# Patient Record
Sex: Female | Born: 1955
Health system: Southern US, Community
[De-identification: ages and names within clinical notes are randomized; demographics above are authoritative.]

## PROBLEM LIST (undated history)

## (undated) DIAGNOSIS — M1990 Unspecified osteoarthritis, unspecified site: Secondary | ICD-10-CM

## (undated) DIAGNOSIS — Z8719 Personal history of other diseases of the digestive system: Secondary | ICD-10-CM

## (undated) DIAGNOSIS — I499 Cardiac arrhythmia, unspecified: Secondary | ICD-10-CM

## (undated) DIAGNOSIS — D649 Anemia, unspecified: Secondary | ICD-10-CM

## (undated) DIAGNOSIS — K219 Gastro-esophageal reflux disease without esophagitis: Secondary | ICD-10-CM

## (undated) DIAGNOSIS — E785 Hyperlipidemia, unspecified: Secondary | ICD-10-CM

## (undated) DIAGNOSIS — M75122 Complete rotator cuff tear or rupture of left shoulder, not specified as traumatic: Secondary | ICD-10-CM

## (undated) DIAGNOSIS — I38 Endocarditis, valve unspecified: Secondary | ICD-10-CM

## (undated) DIAGNOSIS — K449 Diaphragmatic hernia without obstruction or gangrene: Secondary | ICD-10-CM

## (undated) DIAGNOSIS — M7582 Other shoulder lesions, left shoulder: Secondary | ICD-10-CM

## (undated) DIAGNOSIS — R011 Cardiac murmur, unspecified: Secondary | ICD-10-CM

## (undated) DIAGNOSIS — N814 Uterovaginal prolapse, unspecified: Secondary | ICD-10-CM

## (undated) DIAGNOSIS — I10 Essential (primary) hypertension: Secondary | ICD-10-CM

## (undated) DIAGNOSIS — N393 Stress incontinence (female) (male): Secondary | ICD-10-CM

## (undated) DIAGNOSIS — K5790 Diverticulosis of intestine, part unspecified, without perforation or abscess without bleeding: Secondary | ICD-10-CM

## (undated) HISTORY — PX: CARDIAC CATHETERIZATION: SHX172

## (undated) HISTORY — PX: JOINT REPLACEMENT: SHX530

## (undated) HISTORY — PX: CHOLECYSTECTOMY: SHX55

## (undated) HISTORY — PX: HAMMER TOE SURGERY: SHX385

## (undated) HISTORY — PX: ABDOMINAL HYSTERECTOMY: SHX81

---

## 2002-06-02 HISTORY — PX: ROTATOR CUFF REPAIR: SHX139

## 2002-06-02 HISTORY — PX: HALLUX VALGUS CORRECTION: SUR315

## 2002-08-17 DIAGNOSIS — I10 Essential (primary) hypertension: Secondary | ICD-10-CM | POA: Insufficient documentation

## 2003-06-22 DIAGNOSIS — K449 Diaphragmatic hernia without obstruction or gangrene: Secondary | ICD-10-CM | POA: Insufficient documentation

## 2003-08-18 LAB — HM COLONOSCOPY

## 2003-08-28 DIAGNOSIS — K5730 Diverticulosis of large intestine without perforation or abscess without bleeding: Secondary | ICD-10-CM | POA: Insufficient documentation

## 2003-08-28 DIAGNOSIS — R011 Cardiac murmur, unspecified: Secondary | ICD-10-CM | POA: Insufficient documentation

## 2005-04-03 ENCOUNTER — Ambulatory Visit: Payer: Self-pay | Admitting: Family Medicine

## 2007-03-11 ENCOUNTER — Ambulatory Visit: Payer: Self-pay | Admitting: Family Medicine

## 2007-03-11 ENCOUNTER — Inpatient Hospital Stay: Payer: Self-pay | Admitting: General Surgery

## 2007-10-12 DIAGNOSIS — M2550 Pain in unspecified joint: Secondary | ICD-10-CM | POA: Insufficient documentation

## 2010-06-02 DIAGNOSIS — N814 Uterovaginal prolapse, unspecified: Secondary | ICD-10-CM

## 2010-06-02 DIAGNOSIS — N393 Stress incontinence (female) (male): Secondary | ICD-10-CM

## 2010-06-02 HISTORY — DX: Uterovaginal prolapse, unspecified: N81.4

## 2010-06-02 HISTORY — DX: Stress incontinence (female) (male): N39.3

## 2010-12-19 ENCOUNTER — Ambulatory Visit: Payer: Self-pay | Admitting: Family Medicine

## 2010-12-24 LAB — HM PAP SMEAR: HM PAP: NORMAL

## 2011-04-02 ENCOUNTER — Ambulatory Visit: Payer: Self-pay

## 2011-04-17 ENCOUNTER — Ambulatory Visit: Payer: Self-pay | Admitting: Family Medicine

## 2011-10-16 ENCOUNTER — Ambulatory Visit: Payer: Self-pay | Admitting: General Surgery

## 2012-09-30 ENCOUNTER — Ambulatory Visit: Payer: Self-pay

## 2015-03-13 LAB — HM MAMMOGRAPHY

## 2015-04-04 ENCOUNTER — Other Ambulatory Visit: Payer: Self-pay

## 2015-04-05 ENCOUNTER — Encounter
Admission: RE | Admit: 2015-04-05 | Discharge: 2015-04-05 | Disposition: A | Discharge status: Home / Self Care | Payer: BLUE CROSS/BLUE SHIELD | Source: Ambulatory Visit | Attending: Obstetrics & Gynecology | Admitting: Obstetrics & Gynecology

## 2015-04-05 ENCOUNTER — Ambulatory Visit
Admission: RE | Admit: 2015-04-05 | Discharge: 2015-04-05 | Disposition: A | Discharge status: Home / Self Care | Payer: BLUE CROSS/BLUE SHIELD | Source: Ambulatory Visit | Attending: Obstetrics & Gynecology | Admitting: Obstetrics & Gynecology

## 2015-04-05 DIAGNOSIS — I10 Essential (primary) hypertension: Secondary | ICD-10-CM

## 2015-04-05 DIAGNOSIS — K449 Diaphragmatic hernia without obstruction or gangrene: Secondary | ICD-10-CM | POA: Insufficient documentation

## 2015-04-05 HISTORY — DX: Essential (primary) hypertension: I10

## 2015-04-05 HISTORY — DX: Cardiac arrhythmia, unspecified: I49.9

## 2015-04-05 HISTORY — DX: Stress incontinence (female) (male): N39.3

## 2015-04-05 HISTORY — DX: Uterovaginal prolapse, unspecified: N81.4

## 2015-04-05 LAB — BASIC METABOLIC PANEL
ANION GAP: 7 (ref 5–15)
BUN: 16 mg/dL (ref 6–20)
CO2: 29 mmol/L (ref 22–32)
Calcium: 9.9 mg/dL (ref 8.9–10.3)
Chloride: 105 mmol/L (ref 101–111)
Creatinine, Ser: 0.82 mg/dL (ref 0.44–1.00)
GFR calc Af Amer: >60 mL/min (ref >60)
Glucose, Bld: 103 mg/dL — ABNORMAL HIGH (ref 65–99)
POTASSIUM: 3.8 mmol/L (ref 3.5–5.1)
SODIUM: 141 mmol/L (ref 135–145)

## 2015-04-05 LAB — PROTIME-INR
INR: 0.96
Prothrombin Time: 13 seconds (ref 11.4–15.0)

## 2015-04-05 LAB — ABO/RH: ABO/RH(D): A POS

## 2015-04-05 LAB — CBC
HCT: 37.2 % (ref 35.0–47.0)
Hemoglobin: 12.3 g/dL (ref 12.0–16.0)
MCH: 29.7 pg (ref 26.0–34.0)
MCHC: 33.1 g/dL (ref 32.0–36.0)
MCV: 89.7 fL (ref 80.0–100.0)
PLATELETS: 212 10*3/uL (ref 150–440)
RBC: 4.15 MIL/uL (ref 3.80–5.20)
RDW: 13.2 % (ref 11.5–14.5)
WBC: 6.3 10*3/uL (ref 3.6–11.0)

## 2015-04-05 LAB — TYPE AND SCREEN
ABO/RH(D): A POS
Antibody Screen: NEGATIVE

## 2015-04-05 LAB — APTT: aPTT: 33 seconds (ref 24–36)

## 2015-04-05 NOTE — OR Nursing (Signed)
AS INSTRUCTED BY DR Amie Critchley, REQUEST FOR CLEARANCE CALLED AND FAXED TO KAY AT DR [facility] OFFICE.  ALSO SENT TO DR Kenton Kingfisher, SPOKE WITH Rolland Bimler

## 2015-04-05 NOTE — Patient Instructions (Addendum)
  Your procedure is scheduled on: Tuesday Nov. 8, 2016. Report to Same Day Surgery. To find out your arrival time please call [phone] between 1PM - 3PM on Monday November 7,2016.  Remember: Instructions that are not followed completely may result in serious medical risk, up to and including death, or upon the discretion of your surgeon and anesthesiologist your surgery may need to be rescheduled.    _x___ 1. Do not eat food or drink liquids after midnight. No gum chewing or hard candies.     _x___ 2. No Alcohol for 24 hours before or after surgery.   ____ 3. Bring all medications with you on the day of surgery if instructed.    __x__ 4. Notify your doctor if there is any change in your medical condition     (cold, fever, infections).     Do not wear jewelry, make-up, hairpins, clips or nail polish.  Do not wear lotions, powders, or perfumes. You may wear deodorant.  Do not shave 48 hours prior to surgery. Men may shave face and neck.  Do not bring valuables to the hospital.    [facility] is not responsible for any belongings or valuables.               Contacts, dentures or bridgework may not be worn into surgery.  Leave your suitcase in the car. After surgery it may be brought to your room.  For patients admitted to the hospital, discharge time is determined by your treatment team.   Patients discharged the day of surgery will not be allowed to drive home.    Please read over the following fact sheets that you were given:   [facility] Preparing for Surgery  __x__ Take these medicines the morning of surgery with A SIP OF WATER:    1. MAGNESIUM OXIDE  2. Omeprazole Magnesium (PRILOSEC OTC PO)    ____ Fleet Enema (as directed)   __x__ Use CHG Soap as directed  ____ Use inhalers on the day of surgery  ____ Stop metformin 2 days prior to surgery    ____ Take 1/2 of usual insulin dose the night before surgery and none on the morning of  surgery.   ____ Stop  Coumadin/Plavix/aspirin on does not apply.  _x___ Stop Anti-inflammatories such as Ibuprofen now.  Can take Tylenol for pain.   _x__ Stop supplements such as fish oil until after surgery.    ____ Bring C-Pap to the hospital.

## 2015-04-06 NOTE — Pre-Procedure Instructions (Signed)
Medical clearance had been faxed to PCP Dr. Venia Minks regarding abnormal EKG by S. Fields RN on 04/05/15 at 1345.  This RN received a phone call from Dr. Kenton Kingfisher' nurse Izora Gala this am regarding clearance.  Informed Izora Gala that a medical clearance had been sent to Dr. Sharyon Medicus office yesterday.  This RN called Dr. Sharyon Medicus office, spoke to [facility] regarding Medical clearance status at 11:45 am.  Received a call from Dr. Venia Minks at 1249 regarding medical clearance.  She has not seen this pt since 2014 and will need to see pt in order to evaluate and clear pt for surgery.  Dr. Sharyon Medicus office will contact pt for appt and take it from there.  This RN called Izora Gala at Dr. Kenton Kingfisher' office and left on voice mail the above information and my phone number to call for concerns or questions.

## 2015-04-06 NOTE — Pre-Procedure Instructions (Signed)
Izora Gala from Dr. Kenton Kingfisher' office called, pt has an appt at noon on Monday Nov. 7, 2016 for medical clearance.  Pt may get cleared that day and proceed with surgery on Tuesday or she may need further testing and referral to cardiologist and the surgery may be post phoned until clearance is obtained.

## 2015-04-09 ENCOUNTER — Ambulatory Visit (INDEPENDENT_AMBULATORY_CARE_PROVIDER_SITE_OTHER): Payer: BLUE CROSS/BLUE SHIELD | Admitting: Family Medicine

## 2015-04-09 ENCOUNTER — Encounter: Payer: Self-pay | Admitting: Family Medicine

## 2015-04-09 VITALS — BP 158/74 | HR 68 | Temp 98.1°F | Resp 16 | Ht 66.0 in | Wt 184.0 lb

## 2015-04-09 DIAGNOSIS — I10 Essential (primary) hypertension: Secondary | ICD-10-CM

## 2015-04-09 DIAGNOSIS — Z01818 Encounter for other preprocedural examination: Secondary | ICD-10-CM

## 2015-04-09 DIAGNOSIS — S82899A Other fracture of unspecified lower leg, initial encounter for closed fracture: Secondary | ICD-10-CM | POA: Insufficient documentation

## 2015-04-09 DIAGNOSIS — R9431 Abnormal electrocardiogram [ECG] [EKG]: Secondary | ICD-10-CM | POA: Insufficient documentation

## 2015-04-09 DIAGNOSIS — N814 Uterovaginal prolapse, unspecified: Secondary | ICD-10-CM | POA: Insufficient documentation

## 2015-04-09 MED ORDER — LISINOPRIL-HYDROCHLOROTHIAZIDE 10-12.5 MG PO TABS: 1.0000 | ORAL_TABLET | Freq: Every day | ORAL | Status: DC

## 2015-04-09 NOTE — Progress Notes (Signed)
Subjective:     Patient ID: Kelly Nguyen, female   DOB: December 12, 1955, 59 y.o.   MRN: [id number]  HPI   Patient here for pre-op clearance. Is having no current problems. Feels healthier than she has in years. Has new house with stairs. No chest pain, no breathing problems. Has had not prior problems with anesthesia.   Had cardiac work up in 2005 for abnormal EKG.  Had normal cardiac cath at that time.  Has had surgeries since then with no problems. No current medical concerns except need hysterectomy for prolapse. Has stopped her blood pressure medication on recommendation of pre-op MD.    Review of Systems  Constitutional: Negative.   Respiratory: Negative.   Cardiovascular: Negative.   Gastrointestinal: Negative.   Musculoskeletal: Positive for arthralgias. Negative for myalgias, back pain, joint swelling, gait problem, neck pain and neck stiffness.  Neurological: Negative.   .  Patient Active Problem List   Diagnosis Date Noted  . Uterine prolapse 04/09/2015  . Closed fracture of ankle 04/09/2015  . Arthralgia of multiple joints 10/12/2007  . Colon, diverticulosis 08/28/2003  . Cardiac murmur 08/28/2003  . Diaphragmatic hernia 06/22/2003  . Essential (primary) hypertension 08/17/2002   Family History  Problem Relation Age of Onset  . Healthy Mother   . Mental illness Mother   . Heart disease Father   . CAD Father   . Diabetes Other    Social History   Social History  . Marital Status: Married    Spouse Name: N/A  . Number of Children: 1  . Years of Education: N/A   Occupational History  . Full-Time     Retiring in March 2017   Social History Main Topics  . Smoking status: Never Smoker   . Smokeless tobacco: Never Used  . Alcohol Use: 4.2 oz/week    7 Glasses of wine per week  . Drug Use: No  . Sexual Activity: Yes    Birth Control/ Protection: Post-menopausal   Other Topics Concern  . Not on file   Social History Narrative   Past Surgical History  Procedure  Laterality Date  . Hallux valgus correction Right 2004  . Cholecystectomy    . Rotator cuff repair Right 2004   No Known Allergies Previous Medications   B COMPLEX VITAMINS (VITAMIN B COMPLEX PO)    Take 1 tablet by mouth every morning.   ETODOLAC (LODINE) 300 MG CAPSULE    Take 300 mg by mouth every morning.   FISH OIL OIL    1 capsule by Does not apply route every morning.   FLUTICASONE (FLONASE) 50 MCG/ACT NASAL SPRAY    Place into both nostrils daily as needed for allergies or rhinitis.   MAGNESIUM OXIDE PO    Take 1 tablet by mouth every morning.   MULTIPLE VITAMINS-MINERALS (MULTIVITAMIN WITH MINERALS) TABLET    Take 1 tablet by mouth daily.   OMEPRAZOLE MAGNESIUM (PRILOSEC OTC PO)    Take 0.25 tablets by mouth every morning.   PROBIOTIC PRODUCT (PROBIOTIC DAILY PO)    Take 1 capsule by mouth every morning.   RESVERATROL PO    Take 1 capsule by mouth every morning.   BP 158/74 mmHg  Pulse 68  Temp(Src) 98.1 F (36.7 C) (Oral)  Resp 16  Ht 5\' 6"  (1.676 m)  Wt 184 lb (83.462 kg)  BMI 29.71 kg/m2      Objective:   Physical Exam  Constitutional: She is oriented to person, place, and time. She appears  well-developed and well-nourished.  Cardiovascular: Normal rate and regular rhythm.   Neurological: She is alert and oriented to person, place, and time.  Psychiatric: She has a normal mood and affect. Her behavior is normal. Judgment and thought content normal.   BP 158/74 mmHg  Pulse 68  Temp(Src) 98.1 F (36.7 C) (Oral)  Resp 16  Ht 5\' 6"  (1.676 m)  Wt 184 lb (83.462 kg)  BMI 29.71 kg/m2     Assessment:     Healthy 59 yo female.     Plan:     1. Preoperative clearance Patient at average risk for surgery. No risk factors identified.  Reviewed previous records. EKG unchanged. Previous cardiac work up normal.  No current symptoms. Labs normal.    2. Essential (primary) hypertension Sent new prescription. Clarify if she should take her BP medication. Is elevated  today.   - lisinopril-hydrochlorothiazide (PRINZIDE,ZESTORETIC) 10-12.5 MG tablet; Take 1 tablet by mouth daily.  Dispense: 90 tablet; Refill: 1  3. Abnormal EKG Unchanged from 2005.   Margarita Rana, MD

## 2015-04-10 ENCOUNTER — Observation Stay
Admission: RE | Admit: 2015-04-10 | Discharge: 2015-04-11 | Disposition: A | Discharge status: Home / Self Care | Payer: BLUE CROSS/BLUE SHIELD | Source: Ambulatory Visit | Attending: Internal Medicine | Admitting: Internal Medicine

## 2015-04-10 ENCOUNTER — Encounter: Payer: Self-pay | Admitting: *Deleted

## 2015-04-10 ENCOUNTER — Encounter
Admission: RE | Disposition: A | Discharge status: Home / Self Care | Payer: Self-pay | Source: Ambulatory Visit | Attending: Obstetrics & Gynecology

## 2015-04-10 ENCOUNTER — Observation Stay: Payer: BLUE CROSS/BLUE SHIELD

## 2015-04-10 ENCOUNTER — Ambulatory Visit: Payer: BLUE CROSS/BLUE SHIELD | Admitting: Anesthesiology

## 2015-04-10 DIAGNOSIS — Z8249 Family history of ischemic heart disease and other diseases of the circulatory system: Secondary | ICD-10-CM | POA: Insufficient documentation

## 2015-04-10 DIAGNOSIS — Z9889 Other specified postprocedural states: Secondary | ICD-10-CM | POA: Diagnosis not present

## 2015-04-10 DIAGNOSIS — D251 Intramural leiomyoma of uterus: Secondary | ICD-10-CM | POA: Diagnosis not present

## 2015-04-10 DIAGNOSIS — D649 Anemia, unspecified: Secondary | ICD-10-CM | POA: Diagnosis not present

## 2015-04-10 DIAGNOSIS — Z791 Long term (current) use of non-steroidal anti-inflammatories (NSAID): Secondary | ICD-10-CM | POA: Insufficient documentation

## 2015-04-10 DIAGNOSIS — K5790 Diverticulosis of intestine, part unspecified, without perforation or abscess without bleeding: Secondary | ICD-10-CM | POA: Diagnosis not present

## 2015-04-10 DIAGNOSIS — Z818 Family history of other mental and behavioral disorders: Secondary | ICD-10-CM | POA: Diagnosis not present

## 2015-04-10 DIAGNOSIS — Z833 Family history of diabetes mellitus: Secondary | ICD-10-CM | POA: Diagnosis not present

## 2015-04-10 DIAGNOSIS — I10 Essential (primary) hypertension: Secondary | ICD-10-CM | POA: Insufficient documentation

## 2015-04-10 DIAGNOSIS — N814 Uterovaginal prolapse, unspecified: Secondary | ICD-10-CM | POA: Diagnosis present

## 2015-04-10 DIAGNOSIS — J9811 Atelectasis: Secondary | ICD-10-CM | POA: Diagnosis not present

## 2015-04-10 DIAGNOSIS — I959 Hypotension, unspecified: Secondary | ICD-10-CM | POA: Insufficient documentation

## 2015-04-10 DIAGNOSIS — R42 Dizziness and giddiness: Secondary | ICD-10-CM | POA: Diagnosis not present

## 2015-04-10 DIAGNOSIS — R0602 Shortness of breath: Secondary | ICD-10-CM | POA: Diagnosis not present

## 2015-04-10 DIAGNOSIS — Z79899 Other long term (current) drug therapy: Secondary | ICD-10-CM | POA: Insufficient documentation

## 2015-04-10 DIAGNOSIS — Z7951 Long term (current) use of inhaled steroids: Secondary | ICD-10-CM | POA: Insufficient documentation

## 2015-04-10 DIAGNOSIS — K760 Fatty (change of) liver, not elsewhere classified: Secondary | ICD-10-CM | POA: Diagnosis not present

## 2015-04-10 DIAGNOSIS — K449 Diaphragmatic hernia without obstruction or gangrene: Secondary | ICD-10-CM | POA: Insufficient documentation

## 2015-04-10 DIAGNOSIS — N3946 Mixed incontinence: Secondary | ICD-10-CM | POA: Diagnosis present

## 2015-04-10 DIAGNOSIS — Z90710 Acquired absence of both cervix and uterus: Secondary | ICD-10-CM | POA: Diagnosis present

## 2015-04-10 DIAGNOSIS — N858 Other specified noninflammatory disorders of uterus: Secondary | ICD-10-CM | POA: Diagnosis not present

## 2015-04-10 DIAGNOSIS — R9431 Abnormal electrocardiogram [ECG] [EKG]: Secondary | ICD-10-CM | POA: Insufficient documentation

## 2015-04-10 HISTORY — PX: SALPINGOOPHORECTOMY: SHX82

## 2015-04-10 HISTORY — PX: VAGINAL HYSTERECTOMY: SHX2639

## 2015-04-10 HISTORY — PX: CYSTOCELE REPAIR: SHX163

## 2015-04-10 LAB — CBC
HCT: 30.3 % — ABNORMAL LOW (ref 35.0–47.0)
Hemoglobin: 9.8 g/dL — ABNORMAL LOW (ref 12.0–16.0)
MCH: 29.3 pg (ref 26.0–34.0)
MCHC: 32.2 g/dL (ref 32.0–36.0)
MCV: 90.9 fL (ref 80.0–100.0)
PLATELETS: 199 10*3/uL (ref 150–440)
RBC: 3.33 MIL/uL — ABNORMAL LOW (ref 3.80–5.20)
RDW: 13.3 % (ref 11.5–14.5)
WBC: 11.1 10*3/uL — AB (ref 3.6–11.0)

## 2015-04-10 LAB — COMPREHENSIVE METABOLIC PANEL
ALBUMIN: 3 g/dL — AB (ref 3.5–5.0)
ALT: 35 U/L (ref 14–54)
AST: 29 U/L (ref 15–41)
Alkaline Phosphatase: 60 U/L (ref 38–126)
Anion gap: 7 (ref 5–15)
BUN: 16 mg/dL (ref 6–20)
CHLORIDE: 105 mmol/L (ref 101–111)
CO2: 28 mmol/L (ref 22–32)
Calcium: 8.7 mg/dL — ABNORMAL LOW (ref 8.9–10.3)
Creatinine, Ser: 0.84 mg/dL (ref 0.44–1.00)
GFR calc Af Amer: >60 mL/min (ref >60)
GFR calc non Af Amer: >60 mL/min (ref >60)
GLUCOSE: 153 mg/dL — AB (ref 65–99)
POTASSIUM: 3.7 mmol/L (ref 3.5–5.1)
Sodium: 140 mmol/L (ref 135–145)
Total Bilirubin: 0.2 mg/dL — ABNORMAL LOW (ref 0.3–1.2)
Total Protein: 5.4 g/dL — ABNORMAL LOW (ref 6.5–8.1)

## 2015-04-10 LAB — TROPONIN I: Troponin I: <0.03 ng/mL (ref <0.031)

## 2015-04-10 LAB — MAGNESIUM: Magnesium: 1.8 mg/dL (ref 1.7–2.4)

## 2015-04-10 SURGERY — COLPORRHAPHY, ANTERIOR, FOR CYSTOCELE REPAIR
Anesthesia: General

## 2015-04-10 MED ORDER — ACETAMINOPHEN 10 MG/ML IV SOLN
INTRAVENOUS | Status: AC
  Filled 2015-04-10: qty 100

## 2015-04-10 MED ORDER — LIDOCAINE HCL (CARDIAC) 20 MG/ML IV SOLN
INTRAVENOUS | Status: DC | PRN
  Administered 2015-04-10: 100 mg via INTRAVENOUS

## 2015-04-10 MED ORDER — ONDANSETRON HCL 4 MG/2ML IJ SOLN: 4.0000 mg | Freq: Once | INTRAMUSCULAR | Status: DC | PRN

## 2015-04-10 MED ORDER — ACETAMINOPHEN 325 MG PO TABS: 650.0000 mg | ORAL_TABLET | ORAL | Status: DC | PRN

## 2015-04-10 MED ORDER — LACTATED RINGERS IV SOLN
INTRAVENOUS | Status: DC
  Administered 2015-04-10 – 2015-04-11 (×3): via INTRAVENOUS

## 2015-04-10 MED ORDER — SENNOSIDES-DOCUSATE SODIUM 8.6-50 MG PO TABS: 1.0000 | ORAL_TABLET | Freq: Every evening | ORAL | Status: DC | PRN

## 2015-04-10 MED ORDER — BISACODYL 10 MG RE SUPP: 10.0000 mg | Freq: Every day | RECTAL | Status: DC | PRN

## 2015-04-10 MED ORDER — DEXAMETHASONE SODIUM PHOSPHATE 4 MG/ML IJ SOLN
INTRAMUSCULAR | Status: DC | PRN
  Administered 2015-04-10: 5 mg via INTRAVENOUS

## 2015-04-10 MED ORDER — DOCUSATE SODIUM 100 MG PO CAPS
100.0000 mg | ORAL_CAPSULE | Freq: Two times a day (BID) | ORAL | Status: DC
  Administered 2015-04-10 – 2015-04-11 (×4): 100 mg via ORAL
  Filled 2015-04-10 (×4): qty 1

## 2015-04-10 MED ORDER — FENTANYL CITRATE (PF) 100 MCG/2ML IJ SOLN
INTRAMUSCULAR | Status: AC
  Filled 2015-04-10: qty 2

## 2015-04-10 MED ORDER — ACETAMINOPHEN 10 MG/ML IV SOLN
INTRAVENOUS | Status: DC | PRN
  Administered 2015-04-10: 1000 mg via INTRAVENOUS

## 2015-04-10 MED ORDER — FENTANYL CITRATE (PF) 100 MCG/2ML IJ SOLN
25.0000 ug | INTRAMUSCULAR | Status: AC | PRN
  Administered 2015-04-10 (×6): 25 ug via INTRAVENOUS

## 2015-04-10 MED ORDER — EPHEDRINE SULFATE 50 MG/ML IJ SOLN
INTRAMUSCULAR | Status: DC | PRN
  Administered 2015-04-10: 5 mg via INTRAVENOUS
  Administered 2015-04-10 (×2): 10 mg via INTRAVENOUS

## 2015-04-10 MED ORDER — GLYCOPYRROLATE 0.2 MG/ML IJ SOLN
INTRAMUSCULAR | Status: DC | PRN
  Administered 2015-04-10: 0.2 mg via INTRAVENOUS
  Administered 2015-04-10: 0.4 mg via INTRAVENOUS

## 2015-04-10 MED ORDER — HYDROCHLOROTHIAZIDE 12.5 MG PO CAPS: 12.5000 mg | ORAL_CAPSULE | Freq: Every day | ORAL | Status: DC

## 2015-04-10 MED ORDER — BUPIVACAINE HCL (PF) 0.5 % IJ SOLN
INTRAMUSCULAR | Status: DC | PRN
  Administered 2015-04-10: 12 mL

## 2015-04-10 MED ORDER — FLUTICASONE PROPIONATE 50 MCG/ACT NA SUSP: 1.0000 | Freq: Every day | NASAL | Status: DC | PRN

## 2015-04-10 MED ORDER — MIDAZOLAM HCL 2 MG/2ML IJ SOLN
INTRAMUSCULAR | Status: DC | PRN
  Administered 2015-04-10: 2 mg via INTRAVENOUS

## 2015-04-10 MED ORDER — LISINOPRIL-HYDROCHLOROTHIAZIDE 10-12.5 MG PO TABS: 1.0000 | ORAL_TABLET | Freq: Every day | ORAL | Status: DC

## 2015-04-10 MED ORDER — CEFOXITIN SODIUM-DEXTROSE 2-2.2 GM-% IV SOLR (PREMIX)
2.0000 g | Freq: Once | INTRAVENOUS | Status: AC
  Administered 2015-04-10: 2 g via INTRAVENOUS

## 2015-04-10 MED ORDER — LIDOCAINE-EPINEPHRINE 1 %-1:100000 IJ SOLN
INTRAMUSCULAR | Status: AC
  Filled 2015-04-10: qty 1

## 2015-04-10 MED ORDER — ESTROGENS, CONJUGATED 0.625 MG/GM VA CREA
TOPICAL_CREAM | VAGINAL | Status: AC
  Filled 2015-04-10: qty 30

## 2015-04-10 MED ORDER — ONDANSETRON HCL 4 MG/2ML IJ SOLN
INTRAMUSCULAR | Status: DC | PRN
  Administered 2015-04-10: 4 mg via INTRAVENOUS

## 2015-04-10 MED ORDER — CEFOXITIN SODIUM-DEXTROSE 2-2.2 GM-% IV SOLR (PREMIX)
INTRAVENOUS | Status: AC
  Filled 2015-04-10: qty 50

## 2015-04-10 MED ORDER — PHENYLEPHRINE HCL 10 MG/ML IJ SOLN
INTRAMUSCULAR | Status: DC | PRN
  Administered 2015-04-10: 100 ug via INTRAVENOUS

## 2015-04-10 MED ORDER — FENTANYL CITRATE (PF) 100 MCG/2ML IJ SOLN
INTRAMUSCULAR | Status: DC | PRN
  Administered 2015-04-10: 50 ug via INTRAVENOUS
  Administered 2015-04-10: 100 ug via INTRAVENOUS
  Administered 2015-04-10: 50 ug via INTRAVENOUS

## 2015-04-10 MED ORDER — ROCURONIUM BROMIDE 100 MG/10ML IV SOLN
INTRAVENOUS | Status: DC | PRN
  Administered 2015-04-10: 40 mg via INTRAVENOUS
  Administered 2015-04-10: 10 mg via INTRAVENOUS

## 2015-04-10 MED ORDER — NEOSTIGMINE METHYLSULFATE 10 MG/10ML IV SOLN
INTRAVENOUS | Status: DC | PRN
Start: 2015-04-10 — End: 2015-04-10
  Administered 2015-04-10: 3 mg via INTRAVENOUS

## 2015-04-10 MED ORDER — LACTATED RINGERS IV BOLUS (SEPSIS)
500.0000 mL | Freq: Once | INTRAVENOUS | Status: AC
  Administered 2015-04-10: 500 mL via INTRAVENOUS

## 2015-04-10 MED ORDER — KETOROLAC TROMETHAMINE 30 MG/ML IJ SOLN: 30.0000 mg | Freq: Four times a day (QID) | INTRAMUSCULAR | Status: DC

## 2015-04-10 MED ORDER — ONDANSETRON HCL 4 MG/2ML IJ SOLN
4.0000 mg | Freq: Four times a day (QID) | INTRAMUSCULAR | Status: DC | PRN
  Administered 2015-04-10: 4 mg via INTRAVENOUS
  Filled 2015-04-10: qty 2

## 2015-04-10 MED ORDER — PANTOPRAZOLE SODIUM 40 MG PO TBEC
40.0000 mg | DELAYED_RELEASE_TABLET | Freq: Every day | ORAL | Status: DC
  Administered 2015-04-10 – 2015-04-11 (×2): 40 mg via ORAL
  Filled 2015-04-10 (×2): qty 1

## 2015-04-10 MED ORDER — LISINOPRIL 20 MG PO TABS: 10.0000 mg | ORAL_TABLET | Freq: Every day | ORAL | Status: DC

## 2015-04-10 MED ORDER — MORPHINE SULFATE (PF) 2 MG/ML IV SOLN
1.0000 mg | INTRAVENOUS | Status: DC | PRN
  Filled 2015-04-10: qty 1

## 2015-04-10 MED ORDER — OXYCODONE-ACETAMINOPHEN 5-325 MG PO TABS
1.0000 | ORAL_TABLET | ORAL | Status: DC | PRN
  Administered 2015-04-10: 1 via ORAL
  Administered 2015-04-10: 2 via ORAL
  Administered 2015-04-10: 1 via ORAL
  Administered 2015-04-11 (×4): 2 via ORAL
  Filled 2015-04-10 (×4): qty 2
  Filled 2015-04-10: qty 1
  Filled 2015-04-10 (×2): qty 2

## 2015-04-10 MED ORDER — PROPOFOL 10 MG/ML IV BOLUS
INTRAVENOUS | Status: DC | PRN
  Administered 2015-04-10: 170 mg via INTRAVENOUS
  Administered 2015-04-10: 30 mg via INTRAVENOUS

## 2015-04-10 MED ORDER — LACTATED RINGERS IV SOLN
INTRAVENOUS | Status: DC
  Administered 2015-04-10 (×2): via INTRAVENOUS

## 2015-04-10 MED ORDER — HYDROMORPHONE HCL 1 MG/ML IJ SOLN: 0.2500 mg | INTRAMUSCULAR | Status: DC | PRN

## 2015-04-10 MED ORDER — SIMETHICONE 80 MG PO CHEW
80.0000 mg | CHEWABLE_TABLET | Freq: Four times a day (QID) | ORAL | Status: DC | PRN
  Administered 2015-04-10: 80 mg via ORAL
  Filled 2015-04-10: qty 1

## 2015-04-10 MED ORDER — ONDANSETRON HCL 4 MG PO TABS: 4.0000 mg | ORAL_TABLET | Freq: Four times a day (QID) | ORAL | Status: DC | PRN

## 2015-04-10 SURGICAL SUPPLY — 49 items
ANCHOR SUT PEEK 3.0 ST 3 (SUTURE) ×4 IMPLANT
BAG URO DRAIN 2000ML W/SPOUT (MISCELLANEOUS) ×4 IMPLANT
BLADE SURG SZ11 CARB STEEL (BLADE) ×4 IMPLANT
CANISTER SUCT 1200ML W/VALVE (MISCELLANEOUS) ×4 IMPLANT
CATH FOLEY 2WAY  5CC 16FR (CATHETERS) ×1
CATH URTH 16FR FL 2W BLN LF (CATHETERS) ×3 IMPLANT
CHLORAPREP W/TINT 26ML (MISCELLANEOUS) ×4 IMPLANT
DEFOGGER SCOPE WARMER CLEARIFY (MISCELLANEOUS) IMPLANT
DEVICE SUTURE ENDOST 10MM (ENDOMECHANICALS) IMPLANT
DRAPE PERI LITHO V/GYN (MISCELLANEOUS) ×4 IMPLANT
DRSG TEGADERM 2-3/8X2-3/4 SM (GAUZE/BANDAGES/DRESSINGS) IMPLANT
ENDOSTITCH 0 SINGLE 48 (SUTURE) IMPLANT
GAUZE SPONGE NON-WVN 2X2 STRL (MISCELLANEOUS) IMPLANT
GLOVE BIO SURGEON STRL SZ8 (GLOVE) ×20 IMPLANT
GLOVE INDICATOR 8.0 STRL GRN (GLOVE) ×4 IMPLANT
GOWN STRL REUS W/ TWL LRG LVL3 (GOWN DISPOSABLE) ×3 IMPLANT
GOWN STRL REUS W/ TWL XL LVL3 (GOWN DISPOSABLE) ×3 IMPLANT
GOWN STRL REUS W/TWL LRG LVL3 (GOWN DISPOSABLE) ×1
GOWN STRL REUS W/TWL XL LVL3 (GOWN DISPOSABLE) ×1
IRRIGATION STRYKERFLOW (MISCELLANEOUS) IMPLANT
IRRIGATOR STRYKERFLOW (MISCELLANEOUS)
IV LACTATED RINGERS 1000ML (IV SOLUTION) ×4 IMPLANT
KIT RM TURNOVER CYSTO AR (KITS) ×4 IMPLANT
LABEL OR SOLS (LABEL) ×4 IMPLANT
LIQUID BAND (GAUZE/BANDAGES/DRESSINGS) IMPLANT
MANIPULATOR VCARE LG CRV RETR (MISCELLANEOUS) IMPLANT
MANIPULATOR VCARE STD CRV RETR (MISCELLANEOUS) IMPLANT
NEEDLE VERESS 14GA 120MM (NEEDLE) IMPLANT
NS IRRIG 500ML POUR BTL (IV SOLUTION) ×4 IMPLANT
OCCLUDER COLPOPNEUMO (BALLOONS) IMPLANT
PACK BASIN MINOR ARMC (MISCELLANEOUS) ×4 IMPLANT
PACK GYN LAPAROSCOPIC (MISCELLANEOUS) ×4 IMPLANT
PAD OB MATERNITY 4.3X12.25 (PERSONAL CARE ITEMS) ×4 IMPLANT
PAD PREP 24X41 OB/GYN DISP (PERSONAL CARE ITEMS) ×4 IMPLANT
SCISSORS METZENBAUM CVD 33 (INSTRUMENTS) IMPLANT
SET CYSTO W/LG BORE CLAMP LF (SET/KITS/TRAYS/PACK) IMPLANT
SHEARS HARMONIC ACE PLUS 36CM (ENDOMECHANICALS) IMPLANT
SLEEVE ENDOPATH XCEL 5M (ENDOMECHANICALS) IMPLANT
SPONGE LAP 18X18 5 PK (GAUZE/BANDAGES/DRESSINGS) ×4 IMPLANT
SPONGE VERSALON 2X2 STRL (MISCELLANEOUS)
SUT VIC AB 0 CT1 27 (SUTURE) ×4
SUT VIC AB 0 CT1 27XCR 8 STRN (SUTURE) ×12 IMPLANT
SUT VIC AB 0 CT1 36 (SUTURE) ×8 IMPLANT
SUT VIC AB 2-0 UR6 27 (SUTURE) IMPLANT
SYR 50ML LL SCALE MARK (SYRINGE) ×4 IMPLANT
SYRINGE 10CC LL (SYRINGE) ×4 IMPLANT
TROCAR ENDO BLADELESS 11MM (ENDOMECHANICALS) IMPLANT
TROCAR XCEL NON-BLD 5MMX100MML (ENDOMECHANICALS) IMPLANT
TUBING INSUFFLATOR HEATED (MISCELLANEOUS) IMPLANT

## 2015-04-10 NOTE — Anesthesia Procedure Notes (Signed)
Procedure Name: Intubation Date/Time: 04/10/2015 7:43 AM Performed by: Doreen Salvage Pre-anesthesia Checklist: Patient identified, Patient being monitored, Timeout performed, Emergency Drugs available and Suction available Patient Re-evaluated:Patient Re-evaluated prior to inductionOxygen Delivery Method: Circle system utilized Preoxygenation: Pre-oxygenation with 100% oxygen Intubation Type: IV induction Ventilation: Mask ventilation without difficulty Laryngoscope Size: Mac and 3 Grade View: Grade I Tube type: Oral Tube size: 7.0 mm Number of attempts: 1 Airway Equipment and Method: Stylet Placement Confirmation: ETT inserted through vocal cords under direct vision,  positive ETCO2 and breath sounds checked- equal and bilateral Secured at: 21 cm Tube secured with: Tape Dental Injury: Teeth and Oropharynx as per pre-operative assessment

## 2015-04-10 NOTE — Progress Notes (Signed)
GYN Update Note  Patient states she feels much better. Still having some dizziness and lightheadedness but feels much improved as well as with the SOB. Having some right shoulder pain but no chest pain  Patient Vitals for the past 12 hrs:  BP Temp Pulse Resp SpO2 Height Weight  04/10/15 1525 (!) 82/55 mmHg - 77 18 100 % - -  04/10/15 1522 - - - - (!) 86 % - -  04/10/15 1357 - - - - 96 % - -  04/10/15 1250 (!) 87/51 mmHg - 87 20 98 % - -  04/10/15 1220 (!) 79/46 mmHg - 61 - 97 % - -  04/10/15 1207 (!) 71/43 mmHg - (!) 58 20 95 % - -  04/10/15 1203 (!) 73/44 mmHg - (!) 55 - 95 % - -  04/10/15 1147 (!) 67/37 mmHg - - - (!) 84 % - -  04/10/15 1145 (!) 64/39 mmHg - (!) 57 - 94 % - -  04/10/15 1059 (!) 108/58 mmHg 98.1 F (36.7 C) (!) 56 18 98 % - -  04/10/15 1030 - 97.3 F (36.3 C) - - - - -  04/10/15 1010 - - - - 96 % - -  04/10/15 0930 104/64 mmHg 97 F (36.1 C) - 16 100 % - -  04/10/15 0615 (!) 147/74 mmHg 98.1 F (36.7 C) 68 16 100 % 5\' 6"  (1.676 m) 184 lb (83.462 kg)     NAD, pt's color looks better. Nasal cannula in place RRR ?2/6 SEM CTAB GU: clear UOP from foley, no gross VB   Recent Labs Lab 04/10/15 1258  NA 140  K 3.7  CL 105  CO2 28  BUN 16  CREATININE 0.84  CALCIUM 8.7*  PROT 5.4*  BILITOT 0.2*  ALKPHOS 60  ALT 35  AST 29  GLUCOSE 153*    Recent Labs Lab 04/05/15 1249 04/10/15 1258  WBC 6.3 11.1*  HGB 12.3 9.8*  HCT 37.2 30.3*  PLT 212 199    Recent Labs Lab 04/10/15 1258  MG 1.8    Recent Labs Lab 04/10/15 1258  TROPONINI <0.03   PCXR: normal (known hiatal hernia) ECG: appears to be unchanged from pre op  Pt stable to improved I told her hopefully just routine post op sedation and/or lethargy and should be feeling better as time goes on. Will continue troponin trend and continue to keep close eye on VS. Right shoulder pain likely referred from diaphragm (air intraperitoneal from surgery). Awaiting hospitalist consult placed by Dr.  Kenton Kingfisher.  Durene Romans MD [facility]  Pager: [phone]

## 2015-04-10 NOTE — Transfer of Care (Signed)
Immediate Anesthesia Transfer of Care Note  Patient: Kelly Nguyen  Procedure(s) Performed: Procedure(s) with comments: ANTERIOR REPAIR (CYSTOCELE) (N/A) HYSTERECTOMY VAGINAL,  SALPINGO OOPHORECTOMY (Bilateral) - vaginal  Patient Location: PACU  Anesthesia Type:General  Level of Consciousness: sedated  Airway & Oxygen Therapy: Patient Spontanous Breathing and Patient connected to face mask oxygen  Post-op Assessment: Report given to RN and Post -op Vital signs reviewed and stable  Post vital signs: Reviewed and stable  Last Vitals:  Filed Vitals:   04/10/15 0930  BP: 104/64  Pulse:   Temp: 36.1 C  Resp: 16    Complications: No apparent anesthesia complications

## 2015-04-10 NOTE — Op Note (Signed)
Preoperative diagnosis: Pelvic organ prolapse, complete; mixed urinary incontinence  Postoperative diagnosis: Same  Procedure: Transvaginal hysterectomy with Bilateral SalpingoOophorectomy and Anterior Colporrhaphy  Surgeon: Barnett Applebaum, M.D.  Assistant: Sheryn Bison, MD  Anesthesia: general  Findings: Small fibroid on uterus, ovaries small and normal, cystocele.  Estimated blood loss: 50 cc  Specimen: Uterus to pathology   Disposition: Tolerated procedure well  Procedure: Patient was taken to the OR where she was placed in dorsal lithotomy in [place]. She was prepped and draped in the usual sterile fashion. A timeout was performed. Foley is placed into bladder. A speculum was placed inside the vagina. The cervix was visualized and grasped with a tenaculum. 1% lidocaine with epinephrine were injected paracervically. A bovie was used to make a circumferential incision around the vagina. An opened sponge was used to dissect the vagina off the cervix. The posterior peritoneum was entered sharply with Mayo scissors.  The anterior peritoneal cavity was entered sharply with careful dissection of the bladder off the underlying cervix. A Heaney clamp was used to clamp first the left uterosacral ligament and cardinal which was then cut and Haney suture ligated with 0 Vicryl stitch, the stitch was held and later attached to the vaginal mucosa. Similarly the right uterosacral ligament was clamped cut and suture ligated.  Sequential clamping, transecting and suture ligating up the broad to the uterine arteries were taken until the tubo-ovarian pedicles were encountered. The uterus was then amputated. The left utero-ovarian pedicle grasped with a Heaney clamp. The right utero-ovarian pedicle was similarly grasped with the Heaney clamp. The right left ovaries were easily visualized. The left ovary and tube were grasped with Babcock clamp and a Heaney clamp placed behind this. Tube and ovary were removed  and the IP ligated with a free tie followed by suture ligature. The right tube and ovary were similarly grasped with a Babcock clamp and a Heaney clamp used to clamp behind this. The tube and ovary were removed on the side and the IP was ligated with a free tie followed by suture ligature. Inspection of all pedicles revealed adequate hemostasis.  The peritoneum was then closed with a running pursestring suture of 0 Vicryl. The uterosacrals were plicated using a 1-0 Ethibond suture.  Vagina is irrigated. Anterior colporrhaphy is performed.  Allis clamps are placed along the anterior vaginal wall, lidocaine is used to infiltrate the plane, and incision is made midline vertical.  Endopelvic fascia is dissected free of vaginal mucosa.  Fascia is plicated w interrupted vicryl sutures.  Excess mucosa is excised.  Vaginal incision is closed with a running locking Vicryl suture, to incoprate the hysterectomy incision as well, with care taken to incorporate the uterosacral pedicles. Excellent hemostasis was noted at the end of the case. The vaginal cuff was inspected there was minimal bleeding noted.  Packing gauze w Premarin cream is placed. A Foley catheter is left in  place inside her bladder. Clear, yellow urine was noted. All instrument needle and lap counts were correct x 2. Patient was awakened taken to recovery room in stable condition.  Hoyt Koch, MD 04/10/2015, 9:30 AM

## 2015-04-10 NOTE — Consult Note (Signed)
[facility] [place] NAME: Kelly Nguyen    MR#:  [id number]  DATE OF BIRTH:  [birth year]  DATE OF CONSULT:  04/10/2015  PRIMARY CARE PHYSICIAN: Margarita Rana, MD   REQUESTING/REFERRING PHYSICIAN: Dr. Teresa Coombs  CHIEF COMPLAINT:  No chief complaint on file.  hypotension, EKG changes.  HISTORY OF PRESENT ILLNESS:  Kelly Nguyen  is a 59 y.o. female with a known history of hypertension, uterine prolapse, history of stress incontinence, who presented to the hospital for a transvaginal hysterectomy and postoperatively noted to be hypotensive and noted to have abnormal EKG. Patient presently continues to have mild hypotension but denies any chest pain, shortness of breath, nausea, vomiting. She did complain of some dizziness which has improved postoperatively with some IV fluids. Due to her abnormal EKG and persistent hypotension hospitalist services were contacted further treatment and evaluation. Patient presently still complaining of some mild abdominal pain, but no nausea, vomiting, diaphoresis, palpitations, syncope or any other associated symptoms presently.  PAST MEDICAL HISTORY:   Past Medical History  Diagnosis Date  . Prolapse of uterus 2012  . Hypertension   . Stress incontinence 2012  . Dysrhythmia     PAST SURGICAL HISTOIRY:   Past Surgical History  Procedure Laterality Date  . Hallux valgus correction Right 2004  . Cholecystectomy    . Rotator cuff repair Right 2004    SOCIAL HISTORY:   Social History  Substance Use Topics  . Smoking status: Never Smoker   . Smokeless tobacco: Never Used  . Alcohol Use: 4.2 oz/week    7 Glasses of wine per week    FAMILY HISTORY:   Family History  Problem Relation Age of Onset  . Healthy Mother   . Mental illness Mother   . Heart disease Father   . CAD Father   . Diabetes Other     DRUG ALLERGIES:  No Known Allergies  REVIEW OF SYSTEMS:   Review of Systems   Constitutional: Negative for fever and weight loss.  HENT: Negative for congestion, nosebleeds and tinnitus.   Eyes: Negative for blurred vision, double vision and redness.  Respiratory: Negative for cough, hemoptysis and shortness of breath.   Cardiovascular: Negative for chest pain, orthopnea, leg swelling and PND.  Gastrointestinal: Positive for abdominal pain. Negative for nausea, vomiting, diarrhea and melena.  Genitourinary: Negative for dysuria, urgency and hematuria.  Musculoskeletal: Negative for joint pain and falls.  Neurological: Negative for dizziness, tingling, sensory change, focal weakness, seizures, weakness and headaches.  Endo/Heme/Allergies: Negative for polydipsia. Does not bruise/bleed easily.  Psychiatric/Behavioral: Negative for depression and memory loss. The patient is not nervous/anxious.      MEDICATIONS AT HOME:   Prior to Admission medications   Medication Sig Start Date End Date Taking? Authorizing Provider  etodolac (LODINE) 300 MG capsule Take 300 mg by mouth every morning.   Yes Historical Provider, MD  lisinopril-hydrochlorothiazide (PRINZIDE,ZESTORETIC) 10-12.5 MG tablet Take 1 tablet by mouth daily. 04/09/15  Yes Margarita Rana, MD  MAGNESIUM OXIDE PO Take 1 tablet by mouth every morning.   Yes Historical Provider, MD  Omeprazole Magnesium (PRILOSEC OTC PO) Take 0.25 tablets by mouth every morning.   Yes Historical Provider, MD  B Complex Vitamins (VITAMIN B COMPLEX PO) Take 1 tablet by mouth every morning.    Historical Provider, MD  Fish Oil OIL 1 capsule by Does not apply route every morning.    Historical Provider, MD  fluticasone (FLONASE) 50 MCG/ACT  nasal spray Place into both nostrils daily as needed for allergies or rhinitis.    Historical Provider, MD  Multiple Vitamins-Minerals (MULTIVITAMIN WITH MINERALS) tablet Take 1 tablet by mouth daily.    Historical Provider, MD  Probiotic Product (PROBIOTIC DAILY PO) Take 1 capsule by mouth every  morning.    Historical Provider, MD  RESVERATROL PO Take 1 capsule by mouth every morning.    Historical Provider, MD      VITAL SIGNS:  Blood pressure 82/55, pulse 77, temperature 98.1 F (36.7 C), resp. rate 18, height 5\' 6"  (1.676 m), weight 83.462 kg (184 lb), SpO2 100 %.  PHYSICAL EXAMINATION:  GENERAL:  59 y.o.-year-old patient lying in the bed with no acute distress.  EYES: Pupils equal, round, reactive to light and accommodation. No scleral icterus. Extraocular muscles intact.  HEENT: Head atraumatic, normocephalic. Oropharynx and nasopharynx clear.  NECK:  Supple, no jugular venous distention. No thyroid enlargement, no tenderness.  LUNGS: Normal breath sounds bilaterally, no wheezing, rales, rhonchi . No use of accessory muscles of respiration.  CARDIOVASCULAR: S1, S2, RRR. No murmurs, rubs, gallops, clicks.  ABDOMEN: Soft, nontender, nondistended. Hypoactive BS. No organomegaly or mass.  EXTREMITIES: No pedal edema, cyanosis, or clubbing.  NEUROLOGIC: Cranial nerves II through XII are intact. No focal motor or sensory deficits appreciated bilaterally  PSYCHIATRIC: The patient is alert and oriented x 3. Good affect SKIN: No obvious rash, lesion, or ulcer.   Foley catheter in place with clear urine draining.  LABORATORY PANEL:   CBC  Recent Labs Lab 04/10/15 1258  WBC 11.1*  HGB 9.8*  HCT 30.3*  PLT 199   ------------------------------------------------------------------------------------------------------------------  Chemistries   Recent Labs Lab 04/10/15 1258  NA 140  K 3.7  CL 105  CO2 28  GLUCOSE 153*  BUN 16  CREATININE 0.84  CALCIUM 8.7*  MG 1.8  AST 29  ALT 35  ALKPHOS 60  BILITOT 0.2*   ------------------------------------------------------------------------------------------------------------------  Cardiac Enzymes  Recent Labs Lab 04/10/15 1258  TROPONINI <0.03    ------------------------------------------------------------------------------------------------------------------  RADIOLOGY:  Dg Chest 1 View  04/10/2015  CLINICAL DATA:  Status post vaginal hysterectomy, complaints of dizziness and nausea in the postoperative state, history of cardiac dysrhythmia and hypertension EXAM: CHEST 1 VIEW COMPARISON:  PA and lateral chest x-ray of April 05, 2015 FINDINGS: The lungs are well-expanded and clear. There is distention of the hiatal hernia with gas. The heart and pulmonary vascularity are normal. The mediastinum is normal in width. The trachea is midline. There is no pleural effusion or pneumothorax. The bony thorax is unremarkable. IMPRESSION: There is mild gaseous distention of a known hiatal hernia. There is no active cardiopulmonary disease. Electronically Signed   By: David  Martinique M.D.   On: 04/10/2015 13:18     IMPRESSION AND PLAN:   59 year old female with past medical history of hypertension, urinary incontinence, uterine prolapse who presented to the hospital for an elective transvaginal hysterectomy and postoperatively was noted to be hypotensive with an abnormal EKG.  #1 hypotension-this is likely hypovolemic shock. There is no evidence of cardiogenic, septic shock based on clinical and laboratory evaluation. -I suspect this is from the fact that patient took her antihypertensives the morning of surgery and was NPO past midnight.  I will continue IV fluids and follow hemodynamics. Hold lisinopril. -If patient spikes a fever 101, draw blood cultures, urine cultures and a chest x-ray.  #2 abnormal EKG-I have reviewed patient's EKG preoperatively and postoperatively and do not see any new  acute ST or T-wave changes. She has a previous history of LVH with repolarization changes which is not new for the patient. -There is no evidence of acute coronary syndrome unlikely no reason to cycle her cardiac markers.  #3 hypertension-given her  hypotension I would hold her lisinopril for now.  #4 history of urinary incontinence and uterine prolapse status post transvaginal hysterectomy-continue care as per OB/GYN.   Thanks so much for the consultation will follow along with you.  All the records are reviewed and case discussed with Consulting provider. Management plans discussed with the patient, family and they are in agreement.  CODE STATUS: Full  TOTAL TIME TAKING CARE OF THIS PATIENT: 45 minutes.    Henreitta Leber M.D on 04/10/2015 at 4:16 PM  Between 7am to 6pm - Pager - [phone]  After 6pm go to [email or web address] - password [email or web address] [facility]  [facility]  Office  [phone]  CC: Primary care Physician: Margarita Rana, MD

## 2015-04-10 NOTE — Progress Notes (Addendum)
Gynecology Progress Note  Admission Date: 04/10/2015 Current Date: 04/10/2015  Kelly Nguyen is a 59 y.o. G2P1 POD#0 s/p vaginal hysterectomy/BSO/anterior repair    History complicated by: Patient Active Problem List   Diagnosis Date Noted  . S/P hysterectomy 04/10/2015  . Uterine prolapse 04/09/2015  . Closed fracture of ankle 04/09/2015  . Abnormal EKG 04/09/2015  . Pre-op evaluation 04/09/2015  . Arthralgia of multiple joints 10/12/2007  . Colon, diverticulosis 08/28/2003  . Cardiac murmur 08/28/2003  . Diaphragmatic hernia 06/22/2003  . Essential (primary) hypertension 08/17/2002    ROS and patient/family/surgical history, located on admission H&P note dated 04/10/2015, have been reviewed, and there are no changes except as noted below Subjective:  Patient just got up to floor and had normal, initial VS but then RN called to see patient for feeling dizzy and lightheaded and rpt VS show hypotension and difficulty holding O2 sats in normal range  Objective:    Current Vital Signs 24h Vital Sign Ranges  T 98.1 F (36.7 C) Temp  Avg: 97.7 F (36.5 C)  Min: 97 F (36.1 C)  Max: 98.1 F (36.7 C)  BP (!) 71/43 mmHg BP  Min: 64/39  Max: 158/74  HR (!) 58 Pulse  Avg: 60.3  Min: 55  Max: 68  RR 20 Resp  Avg: 17.2  Min: 16  Max: 20  SaO2 95 % Not Delivered SpO2  Avg: 96.9 %  Min: 94 %  Max: 100 %       24 Hour I/O Current Shift I/O  Time Ins Outs   11/08 0701 - 11/08 1900 In: 2000 [I.V.:2000] Out: 650 [Urine:600]    Patient Vitals for the past 24 hrs:  BP Temp Pulse Resp SpO2 Height Weight  04/10/15 1207 (!) 71/43 mmHg - (!) 58 20 95 % - -  04/10/15 1203 (!) 73/44 mmHg - (!) 55 - 95 % - -  04/10/15 1147 (!) 67/37 mmHg - - - - - -  04/10/15 1145 (!) 64/39 mmHg - (!) 57 - 94 % - -  04/10/15 1059 (!) 108/58 mmHg 98.1 F (36.7 C) (!) 56 18 98 % - -  04/10/15 1030 - 97.3 F (36.3 C) - - - - -  04/10/15 1010 - - - - 96 % - -  04/10/15 0930 104/64 mmHg 97 F (36.1 C) - 16 100 %  - -  04/10/15 0615 (!) 147/74 mmHg 98.1 F (36.7 C) 68 16 100 % 5\' 6"  (1.676 m) 184 lb (83.462 kg)   Currently at 98%/2L [place]  Physical exam: NAD, lethargic RRR, HR in the 50s. ?2/6 SEM CTAB Abd: soft, NTTP, ND, +BS GU: no gross VB on pad. Clear UOP in foley Ext: SCDs in place  Medications Current Facility-Administered Medications  Medication Dose Route Frequency Provider Last Rate Last Dose  . acetaminophen (TYLENOL) tablet 650 mg  650 mg Oral Q4H PRN Gae Dry, MD      . bisacodyl (DULCOLAX) suppository 10 mg  10 mg Rectal Daily PRN Gae Dry, MD      . cefOXItin (MEFOXIN) 2-2.2 GM-% IVPB           . docusate sodium (COLACE) capsule 100 mg  100 mg Oral BID Gae Dry, MD      . fentaNYL (SUBLIMAZE) 100 MCG/2ML injection           . fentaNYL (SUBLIMAZE) 100 MCG/2ML injection           . fluticasone (FLONASE)  50 MCG/ACT nasal spray 1 spray  1 spray Each Nare Daily PRN Gae Dry, MD      . Derrill Memo ON 04/11/2015] lisinopril (PRINIVIL,ZESTRIL) tablet 10 mg  10 mg Oral Daily Gae Dry, MD       And  . Derrill Memo ON 04/11/2015] hydrochlorothiazide (MICROZIDE) capsule 12.5 mg  12.5 mg Oral Daily Gae Dry, MD      . lactated ringers bolus 500 mL  500 mL Intravenous Once Gae Dry, MD      . lactated ringers infusion   Intravenous Continuous Gae Dry, MD      . morphine 2 MG/ML injection 1-2 mg  1-2 mg Intravenous Q3H PRN Gae Dry, MD   1 mg at 04/10/15 1129  . ondansetron (ZOFRAN) tablet 4 mg  4 mg Oral Q6H PRN Gae Dry, MD       Or  . ondansetron [facility]) injection 4 mg  4 mg Intravenous Q6H PRN Gae Dry, MD   4 mg at 04/10/15 1129  . oxyCODONE-acetaminophen (PERCOCET/ROXICET) 5-325 MG per tablet 1-2 tablet  1-2 tablet Oral Q4H PRN Gae Dry, MD   1 tablet at 04/10/15 1217  . pantoprazole (PROTONIX) EC tablet 40 mg  40 mg Oral Daily Gae Dry, MD      . senna-docusate (Senokot-S) tablet 1 tablet  1 tablet Oral QHS  PRN Gae Dry, MD      . simethicone [facility]) chewable tablet 80 mg  80 mg Oral QID PRN Gae Dry, MD         Recent Labs Lab 04/05/15 1249  WBC 6.3  HGB 12.3  HCT 37.2  PLT 212     Recent Labs Lab 04/05/15 1249  NA 141  K 3.8  CL 105  CO2 29  BUN 16  CREATININE 0.82  CALCIUM 9.9  GLUCOSE 103*    Radiology None Pre op ECG showed: Sinus rhythm with Fusion complexes, Left axis deviation, Incomplete left bundle branch block, Left ventricular hypertrophy with repolarization abnormality, Abnormal ECG  Assessment & Plan:  Pt stable *GYN: routine care. Continue with foley catheter until VS are better *CV: DDx includes MI, PE, pulmonary edema, bleeding or routine post op lethargy. No e/o post op bleeding. Will get CBC, CMP, Mg, serial troponins, pCXR and ECG. Dr. Kenton Kingfisher has already placed a Hospitalist consult and started an IVF bolus. Consider telemetry and echo if s/s persist and work up negative -patient states she was only on lisinopril at home so HCTZ discontinued *Pain: will hold off on IV meds and do oral only, as could be due to or contributed to above s/s *FEN/GI: ADAT. Continue with MIVF *PPx: SCDs, PPI  Code Status: Full Code  Durene Romans MD [facility] Pager: [phone]

## 2015-04-10 NOTE — Discharge Instructions (Signed)
Vaginal Hysterectomy, Care After Refer to this sheet in the next few weeks. These instructions provide you with information on caring for yourself after your procedure. Your health care provider may also give you more specific instructions. Your treatment has been planned according to current medical practices, but problems sometimes occur. Call your health care provider if you have any problems or questions after your procedure. WHAT TO EXPECT AFTER THE PROCEDURE After your procedure, it is typical to have the following:  Abdominal pain. You will be given pain medicine to control it.  Sore throat from the breathing tube that was inserted during surgery. HOME CARE INSTRUCTIONS  Only take over-the-counter or prescription medicines for pain, discomfort, or fever as directed by your health care provider.  Do not take aspirin. It can cause bleeding.  Do not drive when taking pain medicine.  Follow your health care provider's advice regarding diet, exercise, lifting, driving, and general activities.  Resume your usual diet as directed and allowed.  Get plenty of rest and sleep.  Do not douche, use tampons, or have sexual intercourse for at least 6 weeks, or until your health care provider gives you permission.  Monitor your temperature and notify your health care provider of a fever.  Take showers instead of baths for 2-3 weeks.  Do not drink alcohol until your health care provider gives you permission.  If you develop constipation, you may take a mild laxative with your health care provider's permission. Bran foods may help with constipation problems. Drinking enough fluids to keep your urine clear or pale yellow may help as well.  Try to have someone home with you for 1-2 weeks to help around the house.  Keep all of your follow-up appointments as directed by your health care provider. SEEK MEDICAL CARE IF:   You feel dizzy or lightheaded.  You have pain or bleeding when you  urinate.  You have persistent diarrhea.  You have persistent nausea and vomiting.  You have abnormal vaginal discharge.  You have a rash.  You have any type of abnormal reaction or develop an allergy to your medicine.  You have poor pain control with your prescribed medicine. SEEK IMMEDIATE MEDICAL CARE IF:   You have a fever.  You have severe abdominal pain.  You have chest pain.  You have shortness of breath.  You faint.  You have pain, swelling, or redness in your leg.  You have heavy vaginal bleeding with blood clots. MAKE SURE YOU:  Understand these instructions.  Will watch your condition.  Will get help right away if you are not doing well or get worse.   This information is not intended to replace advice given to you by your health care provider. Make sure you discuss any questions you have with your health care provider.   Document Released: 05/08/2011 Document Revised: 05/24/2013 Document Reviewed: 12/02/2012 Elsevier Interactive Patient Education [employer].

## 2015-04-10 NOTE — Anesthesia Preprocedure Evaluation (Signed)
Anesthesia Evaluation  Patient identified by MRN, date of birth, ID band Patient awake    Reviewed: Allergy & Precautions, H&P , NPO status , Patient's Chart, lab work & pertinent test results, reviewed documented beta blocker date and time   Airway Mallampati: II  TM Distance: >3 FB Neck ROM: full    Dental  (+) Teeth Intact   Pulmonary neg pulmonary ROS, Current Smoker,    Pulmonary exam normal        Cardiovascular hypertension, negative cardio ROS Normal cardiovascular exam Rhythm:regular Rate:Normal     Neuro/Psych negative neurological ROS  negative psych ROS   GI/Hepatic negative GI ROS, Neg liver ROS,   Endo/Other  negative endocrine ROS  Renal/GU negative Renal ROS  negative genitourinary   Musculoskeletal   Abdominal   Peds  Hematology negative hematology ROS (+)   Anesthesia Other Findings   Reproductive/Obstetrics negative OB ROS                             Anesthesia Physical Anesthesia Plan  ASA: II  Anesthesia Plan: General ETT   Post-op Pain Management:    Induction:   Airway Management Planned:   Additional Equipment:   Intra-op Plan:   Post-operative Plan:   Informed Consent: I have reviewed the patients History and Physical, chart, labs and discussed the procedure including the risks, benefits and alternatives for the proposed anesthesia with the patient or authorized representative who has indicated his/her understanding and acceptance.     Plan Discussed with: CRNA  Anesthesia Plan Comments:         Anesthesia Quick Evaluation

## 2015-04-10 NOTE — H&P (Signed)
History and Physical Interval Note:  04/10/2015 6:51 AM  Kelly Nguyen  has presented today for surgery, with the diagnosis of PELVIC ORGAN PROLAPSE,CYSTOCELE AND PELVIC FLOOR WEAKENING  The various methods of treatment have been discussed with the patient and family. After consideration of risks, benefits and other options for treatment, the patient has consented to  Procedure(s): HYSTERECTOMY TOTAL LAPAROSCOPIC/BSO (Bilateral) ANTERIOR REPAIR (CYSTOCELE) (N/A) as a surgical intervention .  The patient's history has been reviewed, patient examined, no change in status, stable for surgery.  Pt has the following beta blocker history-  Not taking Beta Blocker.  I have reviewed the patient's chart and labs.  Questions were answered to the patient's satisfaction.    Sanskriti Greenlaw Eddie Dibbles

## 2015-04-11 ENCOUNTER — Encounter: Payer: Self-pay | Admitting: Radiology

## 2015-04-11 ENCOUNTER — Observation Stay: Payer: BLUE CROSS/BLUE SHIELD

## 2015-04-11 DIAGNOSIS — D251 Intramural leiomyoma of uterus: Secondary | ICD-10-CM | POA: Diagnosis not present

## 2015-04-11 LAB — HEMOGLOBIN: Hemoglobin: 8.1 g/dL — ABNORMAL LOW (ref 12.0–16.0)

## 2015-04-11 LAB — TROPONIN I: Troponin I: <0.03 ng/mL (ref <0.031)

## 2015-04-11 MED ORDER — OXYCODONE-ACETAMINOPHEN 5-325 MG PO TABS: 1.0000 | ORAL_TABLET | ORAL | Status: DC | PRN

## 2015-04-11 MED ORDER — IOHEXOL 350 MG/ML SOLN
100.0000 mL | Freq: Once | INTRAVENOUS | Status: AC | PRN
  Administered 2015-04-11: 100 mL via INTRAVENOUS

## 2015-04-11 NOTE — Progress Notes (Signed)
1 Day Post-Op Procedure(s) (LRB): ANTERIOR REPAIR (CYSTOCELE) (N/A) HYSTERECTOMY VAGINAL,  SALPINGO OOPHORECTOMY (Bilateral)  Subjective: Patient reports min pain, and most pain is in shoulder and knee.  No bleeding noted.  Tol liquid diet well, has appetite for food.  No dizziness this am.  .    Objective: I have reviewed patient's vital signs, intake and output, medications, labs and radiology results.  Abd: Min T, ND Extr: no calf T, no edema  Assessment: s/p Procedure(s) with comments: ANTERIOR REPAIR (CYSTOCELE) (N/A) HYSTERECTOMY VAGINAL,  SALPINGO OOPHORECTOMY (Bilateral) - vaginal: stable  Plan: Advance diet Encourage ambulation Advance to PO medication Discontinue IV fluids D/C foley and vag packing  Anticipate discharge today if can accomplish above tasks.  Hold BP meds for now.     LOS: 1 day   HARRIS,ROBERT PAUL 04/11/2015, 7:14 AM

## 2015-04-11 NOTE — Progress Notes (Signed)
[facility] [place] NAME: Kelly Nguyen    MR#:  [id number]  DATE OF BIRTH:  [birth year]  SUBJECTIVE:  CHIEF COMPLAINT: Pt is resting comfortably during my exam. Denies any dizziness or sob   REVIEW OF SYSTEMS:  CONSTITUTIONAL: No fever, fatigue or weakness.  EYES: No blurred or double vision.  EARS, NOSE, AND THROAT: No tinnitus or ear pain.  RESPIRATORY: No cough, shortness of breath, wheezing or hemoptysis.  CARDIOVASCULAR: No chest pain, orthopnea, edema.  GASTROINTESTINAL: No nausea, vomiting, diarrea  GENITOURINARY: No dysuria, hematuria.  ENDOCRINE: No polyuria, nocturia,  HEMATOLOGY: No anemia, easy bruising or bleeding SKIN: No rash or lesion. MUSCULOSKELETAL: No joint pain or arthritis.   NEUROLOGIC: No tingling, numbness, weakness.  PSYCHIATRY: No anxiety or depression.   DRUG ALLERGIES:  No Known Allergies  VITALS:  Blood pressure 114/65, pulse 88, temperature 98.4 F (36.9 C), temperature source Oral, resp. rate 20, height 5\' 6"  (1.676 m), weight 83.462 kg (184 lb), SpO2 99 %.  PHYSICAL EXAMINATION:  GENERAL:  59 y.o.-year-old patient lying in the bed with no acute distress.  EYES: Pupils equal, round, reactive to light and accommodation. No scleral icterus. Extraocular muscles intact.  HEENT: Head atraumatic, normocephalic. Oropharynx and nasopharynx clear.  NECK:  Supple, no jugular venous distention. No thyroid enlargement, no tenderness.  LUNGS: Normal breath sounds bilaterally, no wheezing, rales,rhonchi or crepitation. No use of accessory muscles of respiration.  CARDIOVASCULAR: S1, S2 normal. No murmurs, rubs, or gallops.  ABDOMEN: Soft, lower abd discomfort , nondistended. Bowel sounds present. No organomegaly or mass.  EXTREMITIES: No pedal edema, cyanosis, or clubbing.  NEUROLOGIC: Cranial nerves II through XII are intact. Muscle strength 5/5 in all extremities. Sensation intact. Gait not checked.   PSYCHIATRIC: The patient is alert and oriented x 3.  SKIN: No obvious rash, lesion, or ulcer.    LABORATORY PANEL:   CBC  Recent Labs Lab 04/10/15 1258 04/11/15 0404  WBC 11.1*  --   HGB 9.8* 8.1*  HCT 30.3*  --   PLT 199  --    ------------------------------------------------------------------------------------------------------------------  Chemistries   Recent Labs Lab 04/10/15 1258  NA 140  K 3.7  CL 105  CO2 28  GLUCOSE 153*  BUN 16  CREATININE 0.84  CALCIUM 8.7*  MG 1.8  AST 29  ALT 35  ALKPHOS 60  BILITOT 0.2*   ------------------------------------------------------------------------------------------------------------------  Cardiac Enzymes  Recent Labs Lab 04/11/15 0404  TROPONINI <0.03   ------------------------------------------------------------------------------------------------------------------  RADIOLOGY:  Dg Chest 1 View  04/10/2015  CLINICAL DATA:  Status post vaginal hysterectomy, complaints of dizziness and nausea in the postoperative state, history of cardiac dysrhythmia and hypertension EXAM: CHEST 1 VIEW COMPARISON:  PA and lateral chest x-ray of April 05, 2015 FINDINGS: The lungs are well-expanded and clear. There is distention of the hiatal hernia with gas. The heart and pulmonary vascularity are normal. The mediastinum is normal in width. The trachea is midline. There is no pleural effusion or pneumothorax. The bony thorax is unremarkable. IMPRESSION: There is mild gaseous distention of a known hiatal hernia. There is no active cardiopulmonary disease. Electronically Signed   By: David  Martinique M.D.   On: 04/10/2015 13:18   Ct Angio Chest Pe W/cm &/or Wo Cm  04/11/2015  CLINICAL DATA:  Last night had lower BPs then usual for her and for post op. Today, some O2 desaturations at times to the 70s and 80s with occassional SOB with lightheadedness this afternoon.  O2 became normal with O2 suplementation and taking deep breaths. O2 off with  normal sats for the last 2 hours now. EXAM: CT ANGIOGRAPHY CHEST WITH CONTRAST TECHNIQUE: Multidetector CT imaging of the chest was performed using the standard protocol during bolus administration of intravenous contrast. Multiplanar CT image reconstructions and MIPs were obtained to evaluate the vascular anatomy. CONTRAST:  115mL OMNIPAQUE IOHEXOL 350 MG/ML SOLN COMPARISON:  Chest radiograph, 04/10/2015 FINDINGS: Angiographic study: No evidence of pulmonary embolus. No aortic dissection or aneurysm. Thoracic inlet: No mass or adenopathy. Visualized thyroid is unremarkable. Mediastinum and hila: Heart normal in size. There are dense calcifications along the apex of the left ventricle system with calcification and old infarcts. Minimal coronary artery calcifications. Moderate hiatal hernia. No mediastinal or hilar masses or adenopathy. Lungs and pleural: Right greater than left lower lobe atelectasis. Mild posterior medial right upper lobe atelectasis. No convincing pneumonia. No pulmonary edema. No mass or suspicious nodule. Trace right pleural fluid. No pneumothorax. Limited upper abdomen: Diffuse fatty infiltration of the liver. Status post cholecystectomy. Musculoskeletal: Degenerative changes noted throughout the visualized spine. No osteoblastic or osteolytic lesions. Review of the MIP images confirms the above findings. IMPRESSION: 1. No evidence of a pulmonary embolus. 2. Lungs show atelectasis most evident in the right lower lobe. No convincing pneumonia or pulmonary edema. Electronically Signed   By: Lajean Manes M.D.   On: 04/11/2015 18:41    EKG:   Orders placed or performed during the hospital encounter of 04/10/15  . EKG 12-Lead  . EKG 12-Lead    ASSESSMENT AND PLAN:   59 year old female with past medical history of hypertension, urinary incontinence, uterine prolapse who presented to the hospital for an elective transvaginal hysterectomy and postoperatively was noted to be hypotensive  with an abnormal EKG.  # Transient hypoxia - prob from atelectasis CTA with no PE, no PNa and dissection Encouraged pt to use IS to prevent atelectasis   #  hypotension-this is likely hypovolemic . There is no evidence of cardiogenic, septic shock based on clinical and laboratory evaluation. - improved  # abnormal EKG-  EKG preoperatively with no new acute ST or T-wave changes. She has a previous history of LVH with repolarization changes which is not new for the patient. -There is no evidence of acute coronary syndrome   # hypertension-given her hypotension I would hold her lisinopril for now.  #4 history of urinary incontinence and uterine prolapse status post transvaginal hysterectomy-continue care as per OB/GYN.  OK to d/c from medicine stand point    All the records are reviewed and case discussed with Care Management/Social Workerr. Management plans discussed with the patient, family and they are in agreement.  CODE STATUS: FC  TOTAL TIME TAKING CARE OF THIS PATIENT: 35 minutes.      Nicholes Mango M.D on 04/11/2015 at 8:13 PM  Between 7am to 6pm - Pager - [phone] After 6pm go to [email or web address] - password [email or web address] [facility]  [facility]  Office  [phone]  CC: Primary care physician; Margarita Rana, MD

## 2015-04-11 NOTE — Progress Notes (Signed)
Pt c/o getting a "chill" and "feeling like my breath catches on the right side". See CHL for vital signs. O2 sat initially in the 70s and 80s on room air. Cap refill 5 seconds. 2L O2 applied and sats improved to 95. O2 removed and sats dropped to 80s once again. O2 re-applied. Breath sounds clear bilaterally. Pt placed on pulse oximeter and encouraged to use incentive spirometer. O2 sats shows signs of improvement with incentive spirometer use, staying the 90s on room air.

## 2015-04-11 NOTE — Progress Notes (Signed)
CTSP due to O2 desaturation to the 70s and 80s and SOB with lightheadedness this afternoon. O2 became normal with O2 suplementation and taking deep breaths. Now off O2. Patient is postop day #1 from [facility], anterior repair and BSO. Was getting ready to go home. Was seen by Dr Abel Presto yesterday for an episode of hypotension postoperatively  S: "I was a little lightheaded and SOB the last time I got up and walked. I have been having this little catch under my right breast since the surgery. " Had pain in right shoulder also after surgery that resolved (presumably from phrenic nerve irritation). Has past a small amt of gas since surgery.  O; General : alert, awake and answering questions appropriately BP 95/59 mmHg  Pulse 88  Temp(Src) 98.3 F (36.8 C) (Oral)  Resp 18  Ht 5\' 6"  (1.676 m)  Wt 184 lb (83.462 kg)  BMI 29.71 kg/m2  SpO2 94%  Post op hemoglobin 8.1gm/dl Capillary refill 5 sec Heart: RRR with Grade II/VI systolic murmur Lungs: CTA all fields Legs: thigh high TED hose on, no calf tenderness Abdomen: soft, NT, ND CXR yesterday was negative for pneumonia  A/P: Unsure as to the cause of O2 desaturation. Patient is anemic. Concern for PE postoperatively. Discussed POM with Dr Kenton Kingfisher. Will have hospitalist revisit and evaluate   Kaitlinn Iversen, CNM

## 2015-04-11 NOTE — Progress Notes (Signed)
All discharge instructions given to pt; RN explained the importance of using the incentive spirometry; pt needs to call and make own appointment; pt discharged via wheelchair escorted by RN; pt going home with husband

## 2015-04-11 NOTE — Discharge Summary (Signed)
Gynecology Physician Postoperative Discharge Summary  Patient ID: Kelly Nguyen MRN: [id number] DOB/AGE: 12-20-1955 59 y.o.  Admit Date: 04/10/2015 Discharge Date: 04/11/2015  Preoperative Diagnoses: Pelvic organ prolapse, incontinence  Procedures: Procedure(s) (LRB): ANTERIOR REPAIR (CYSTOCELE) (N/A) HYSTERECTOMY VAGINAL,  SALPINGO OOPHORECTOMY (Bilateral)  Significant Labs: CBC Latest Ref Rng 04/11/2015 04/10/2015 04/05/2015  WBC 3.6 - 11.0 K/uL - 11.1(H) 6.3  Hemoglobin 12.0 - 16.0 g/dL 8.1(L) 9.8(L) 12.3  Hematocrit 35.0 - 47.0 % - 30.3(L) 37.2  Platelets 150 - 440 K/uL - 199 212    Hospital Course:  Kelly Nguyen is a 59 y.o. G2P1  admitted for scheduled surgery.  She underwent the procedures as mentioned above, her operation was uncomplicated. For further details about surgery, please refer to the operative report. Patient had an uncomplicated postoperative course. She had post op hypotension evaluation with no cardiac findings and most likely related to post op anes/hydration.  By time of discharge on POD#1, her pain was controlled on oral pain medications; she was ambulating, voiding without difficulty, tolerating regular diet and passing flatus. She was deemed stable for discharge to home.   Discharge Exam: Blood pressure 101/66, pulse 72, temperature 98 F (36.7 C), temperature source Oral, resp. rate 18, height 5\' 6"  (1.676 m), weight 184 lb (83.462 kg), SpO2 97 %. General appearance: alert and no distress  Resp: clear to auscultation bilaterally  Cardio: regular rate and rhythm  GI: soft, non-tender; bowel sounds normal; no masses, no organomegaly.  Pelvic: scant blood on pad  Extremities: extremities normal, atraumatic, no cyanosis or edema and Homans sign is negative, no sign of DVT  Discharged Condition: Stable  Disposition: Home    Medication List    TAKE these medications        etodolac 300 MG capsule  Commonly known as:  LODINE  Take 300 mg by mouth every morning.      Fish Oil Oil  1 capsule by Does not apply route every morning.     fluticasone 50 MCG/ACT nasal spray  Commonly known as:  FLONASE  Place into both nostrils daily as needed for allergies or rhinitis.     lisinopril-hydrochlorothiazide 10-12.5 MG tablet  Commonly known as:  PRINZIDE,ZESTORETIC  Take 1 tablet by mouth daily.     MAGNESIUM OXIDE PO  Take 1 tablet by mouth every morning.     multivitamin with minerals tablet  Take 1 tablet by mouth daily.     oxyCODONE-acetaminophen 5-325 MG tablet  Commonly known as:  PERCOCET/ROXICET  Take 1-2 tablets by mouth every 4 (four) hours as needed (moderate to severe pain (when tolerating fluids)).     PRILOSEC OTC PO  Take 0.25 tablets by mouth every morning.     PROBIOTIC DAILY PO  Take 1 capsule by mouth every morning.     RESVERATROL PO  Take 1 capsule by mouth every morning.     VITAMIN B COMPLEX PO  Take 1 tablet by mouth every morning.      HOLD LISINOPRIL DUE TO LOW BLOOD PRESSURES, HAVE RECHECKED in OFFICE. PERCOCET or MOTRIN FOR PAIN  Follow-up Information    Follow up with Hoyt Koch, MD In 2 weeks.   Specialty:  Obstetrics and Gynecology   Why:  As Scheduled, For Post Op   Contact information:   [address] [place] [address] [phone]       Barnett Applebaum, MD

## 2015-04-11 NOTE — Progress Notes (Signed)
Pt POD 1 TVH BSO AR.  Doing well from bleeding and pain standpoint.  Last night had lower BPs then usual for her and for post op.  Treated w fluid bolus, medicine cleared from a cardiac standpoint. Today, some O2 desaturations at times to the 70s and 80s with occas SOB with lightheadedness this afternoon. O2 became normal with O2 suplementation and taking deep breaths. O2 off with normal sats for the last 2 hours now.  O; General : alert, awake and answering questions appropriately BP 111/63 mmHg  Pulse 82  Temp(Src) 98.4 F (36.9 C) (Oral)  Resp 20  Ht 5\' 6"  (1.676 m)  Wt 184 lb (83.462 kg)  BMI 29.71 kg/m2  SpO2 96%  Post op hemoglobin 8.1gm/dl CXR yesterday was negative for pneumonia  A/P: Unsure as to the cause of O2 desaturation. Concern for PE postoperatively. Although low liklihood, will check CT angiogram to rule out PE or other etiology.  D/W Hospitalist Dr Margaretmary Eddy.  Hoyt Koch, MD

## 2015-04-12 LAB — SURGICAL PATHOLOGY

## 2015-04-16 NOTE — Anesthesia Postprocedure Evaluation (Signed)
  Anesthesia Post-op Note  Patient: Kelly Nguyen  Procedure(s) Performed: Procedure(s) with comments: ANTERIOR REPAIR (CYSTOCELE) (N/A) HYSTERECTOMY VAGINAL,  SALPINGO OOPHORECTOMY (Bilateral) - vaginal  Anesthesia type:General ETT  Patient location: PACU  Post pain: Pain level controlled  Post assessment: Post-op Vital signs reviewed, Patient's Cardiovascular Status Stable, Respiratory Function Stable, Patent Airway and No signs of Nausea or vomiting  Post vital signs: Reviewed and stable  Last Vitals:  Filed Vitals:   04/11/15 1905  BP:   Pulse: 88  Temp:   Resp:     Level of consciousness: awake, alert  and patient cooperative  Complications: No apparent anesthesia complications

## 2015-05-25 ENCOUNTER — Encounter: Payer: Self-pay | Admitting: Family Medicine

## 2015-07-11 ENCOUNTER — Other Ambulatory Visit: Payer: Self-pay | Admitting: Family Medicine

## 2015-07-11 DIAGNOSIS — I10 Essential (primary) hypertension: Secondary | ICD-10-CM

## 2016-03-28 DIAGNOSIS — M5416 Radiculopathy, lumbar region: Secondary | ICD-10-CM | POA: Insufficient documentation

## 2016-10-07 ENCOUNTER — Ambulatory Visit (INDEPENDENT_AMBULATORY_CARE_PROVIDER_SITE_OTHER): Payer: BLUE CROSS/BLUE SHIELD | Admitting: Physician Assistant

## 2016-10-07 VITALS — BP 112/62 | HR 64 | Temp 98.3°F | Resp 16 | Wt 188.0 lb

## 2016-10-07 DIAGNOSIS — I10 Essential (primary) hypertension: Secondary | ICD-10-CM | POA: Diagnosis not present

## 2016-10-07 MED ORDER — LISINOPRIL-HYDROCHLOROTHIAZIDE 10-12.5 MG PO TABS: 1.0000 | ORAL_TABLET | Freq: Every day | ORAL | 0 refills | Status: DC

## 2016-10-07 NOTE — Progress Notes (Signed)
Patient: Kelly Nguyen Female    DOB: Apr 18, 1956   61 y.o.   MRN: [id number] Visit Date: 10/07/2016  Today's Provider: Trinna Post, PA-C   Chief Complaint  Patient presents with  . Hypertension   Subjective:    Hypertension  The problem is controlled. Pertinent negatives include no anxiety, blurred vision, chest pain, headaches, malaise/fatigue, neck pain, orthopnea, palpitations, peripheral edema, PND, shortness of breath or sweats. There are no compliance problems.    Diet and exercise are poor right now. Having to take care of sick mother. Does water aerobics but hasn't had time. Walking limited by orthopedic issues, currently getting Syn Visc injections.    No Known Allergies   Current Outpatient Prescriptions:  .  B Complex Vitamins (VITAMIN B COMPLEX PO), Take 1 tablet by mouth every morning., Disp: , Rfl:  .  Fish Oil OIL, 1 capsule by Does not apply route every morning., Disp: , Rfl:  .  fluticasone (FLONASE) 50 MCG/ACT nasal spray, Place into both nostrils daily as needed for allergies or rhinitis., Disp: , Rfl:  .  lisinopril-hydrochlorothiazide (PRINZIDE,ZESTORETIC) 10-12.5 MG tablet, TAKE ONE TABLET BY MOUTH EVERY DAY, Disp: 90 tablet, Rfl: 3 .  MAGNESIUM OXIDE PO, Take 1 tablet by mouth every morning., Disp: , Rfl:  .  Multiple Vitamins-Minerals (MULTIVITAMIN WITH MINERALS) tablet, Take 1 tablet by mouth daily., Disp: , Rfl:  .  Omeprazole Magnesium (PRILOSEC OTC PO), Take 0.25 tablets by mouth every morning., Disp: , Rfl:  .  Probiotic Product (PROBIOTIC DAILY PO), Take 1 capsule by mouth every morning., Disp: , Rfl:  .  RESVERATROL PO, Take 1 capsule by mouth every morning., Disp: , Rfl:   Review of Systems  Constitutional: Negative.  Negative for malaise/fatigue.  Eyes: Negative for blurred vision.  Respiratory: Negative.  Negative for shortness of breath.   Cardiovascular: Negative.  Negative for chest pain, palpitations, orthopnea and PND.    Gastrointestinal: Negative.        Has a history of IBS.  Pt states it is stable right.    Musculoskeletal: Negative.  Negative for neck pain.  Neurological: Negative for dizziness, light-headedness and headaches.    Social History  Substance Use Topics  . Smoking status: Never Smoker  . Smokeless tobacco: Never Used  . Alcohol use 4.2 oz/week    7 Glasses of wine per week   Objective:   BP 112/62 (BP Location: Left Arm, Patient Position: Sitting, Cuff Size: Normal)   Pulse 64   Temp 98.3 F (36.8 C) (Oral)   Resp 16   Wt 188 lb (85.3 kg)   BMI 30.34 kg/m  Vitals:   10/07/16 1044  BP: 112/62  Pulse: 64  Resp: 16  Temp: 98.3 F (36.8 C)  TempSrc: Oral  Weight: 188 lb (85.3 kg)     Physical Exam  Constitutional: She appears well-developed and well-nourished.  Cardiovascular: Normal rate and regular rhythm.   Pulmonary/Chest: Effort normal and breath sounds normal.  Skin: Skin is warm and dry.  Psychiatric: She has a normal mood and affect. Her behavior is normal.        Assessment & Plan:     1. Essential (primary) hypertension  Refilled BP meds. Labs as below to get before CPE so we can discuss at that time. Needs CPE for maintenance.   - lisinopril-hydrochlorothiazide (PRINZIDE,ZESTORETIC) 10-12.5 MG tablet; Take 1 tablet by mouth daily.  Dispense: 90 tablet; Refill: 0 - Comprehensive metabolic panel; Future -  CBC with Differential/Platelet; Future - Lipid panel; Future - TSH; Future  The entirety of the information documented in the History of Present Illness, Review of Systems and Physical Exam were personally obtained by me. Portions of this information were initially documented by Ashley Royalty, CMA and reviewed by me for thoroughness and accuracy.   Return in about 2 months (around 12/07/2016).        Trinna Post, PA-C  [facility]

## 2016-10-07 NOTE — Patient Instructions (Signed)
Hypertension °Hypertension is another name for high blood pressure. High blood pressure forces your heart to work harder to pump blood. This can cause problems over time. °There are two numbers in a blood pressure reading. There is a top number (systolic) over a bottom number (diastolic). It is best to have a blood pressure below 120/80. Healthy choices can help lower your blood pressure. You may need medicine to help lower your blood pressure if: °· Your blood pressure cannot be lowered with healthy choices. °· Your blood pressure is higher than 130/80. °Follow these instructions at home: °Eating and drinking  °· If directed, follow the DASH eating plan. This diet includes: °¨ Filling half of your plate at each meal with fruits and vegetables. °¨ Filling one quarter of your plate at each meal with whole grains. Whole grains include whole wheat pasta, brown rice, and whole grain bread. °¨ Eating or drinking low-fat dairy products, such as skim milk or low-fat yogurt. °¨ Filling one quarter of your plate at each meal with low-fat (lean) proteins. Low-fat proteins include fish, skinless chicken, eggs, beans, and tofu. °¨ Avoiding fatty meat, cured and processed meat, or chicken with skin. °¨ Avoiding premade or processed food. °· Eat less than 1,500 mg of salt (sodium) a day. °· Limit alcohol use to no more than 1 drink a day for nonpregnant women and 2 drinks a day for men. One drink equals 12 oz of beer, 5 oz of wine, or 1½ oz of hard liquor. °Lifestyle  °· Work with your doctor to stay at a healthy weight or to lose weight. Ask your doctor what the best weight is for you. °· Get at least 30 minutes of exercise that causes your heart to beat faster (aerobic exercise) most days of the week. This may include walking, swimming, or biking. °· Get at least 30 minutes of exercise that strengthens your muscles (resistance exercise) at least 3 days a week. This may include lifting weights or pilates. °· Do not use any  products that contain nicotine or tobacco. This includes cigarettes and e-cigarettes. If you need help quitting, ask your doctor. °· Check your blood pressure at home as told by your doctor. °· Keep all follow-up visits as told by your doctor. This is important. °Medicines  °· Take over-the-counter and prescription medicines only as told by your doctor. Follow directions carefully. °· Do not skip doses of blood pressure medicine. The medicine does not work as well if you skip doses. Skipping doses also puts you at risk for problems. °· Ask your doctor about side effects or reactions to medicines that you should watch for. °Contact a doctor if: °· You think you are having a reaction to the medicine you are taking. °· You have headaches that keep coming back (recurring). °· You feel dizzy. °· You have swelling in your ankles. °· You have trouble with your vision. °Get help right away if: °· You get a very bad headache. °· You start to feel confused. °· You feel weak or numb. °· You feel faint. °· You get very bad pain in your: °¨ Chest. °¨ Belly (abdomen). °· You throw up (vomit) more than once. °· You have trouble breathing. °Summary °· Hypertension is another name for high blood pressure. °· Making healthy choices can help lower blood pressure. If your blood pressure cannot be controlled with healthy choices, you may need to take medicine. °This information is not intended to replace advice given to you by your   health care provider. Make sure you discuss any questions you have with your health care provider. °Document Released: 11/05/2007 Document Revised: 04/16/2016 Document Reviewed: 04/16/2016 °Elsevier Interactive Patient Education © 2017 Elsevier Inc[employer] ° °

## 2016-11-01 IMAGING — CR DG CHEST 2V
2 series · 2 of 2 positions shown · non-contrast
Comparison: 03/11/2007.

CLINICAL DATA: Hysterectomy.

EXAM:
CHEST  2 VIEW

[chest pa]
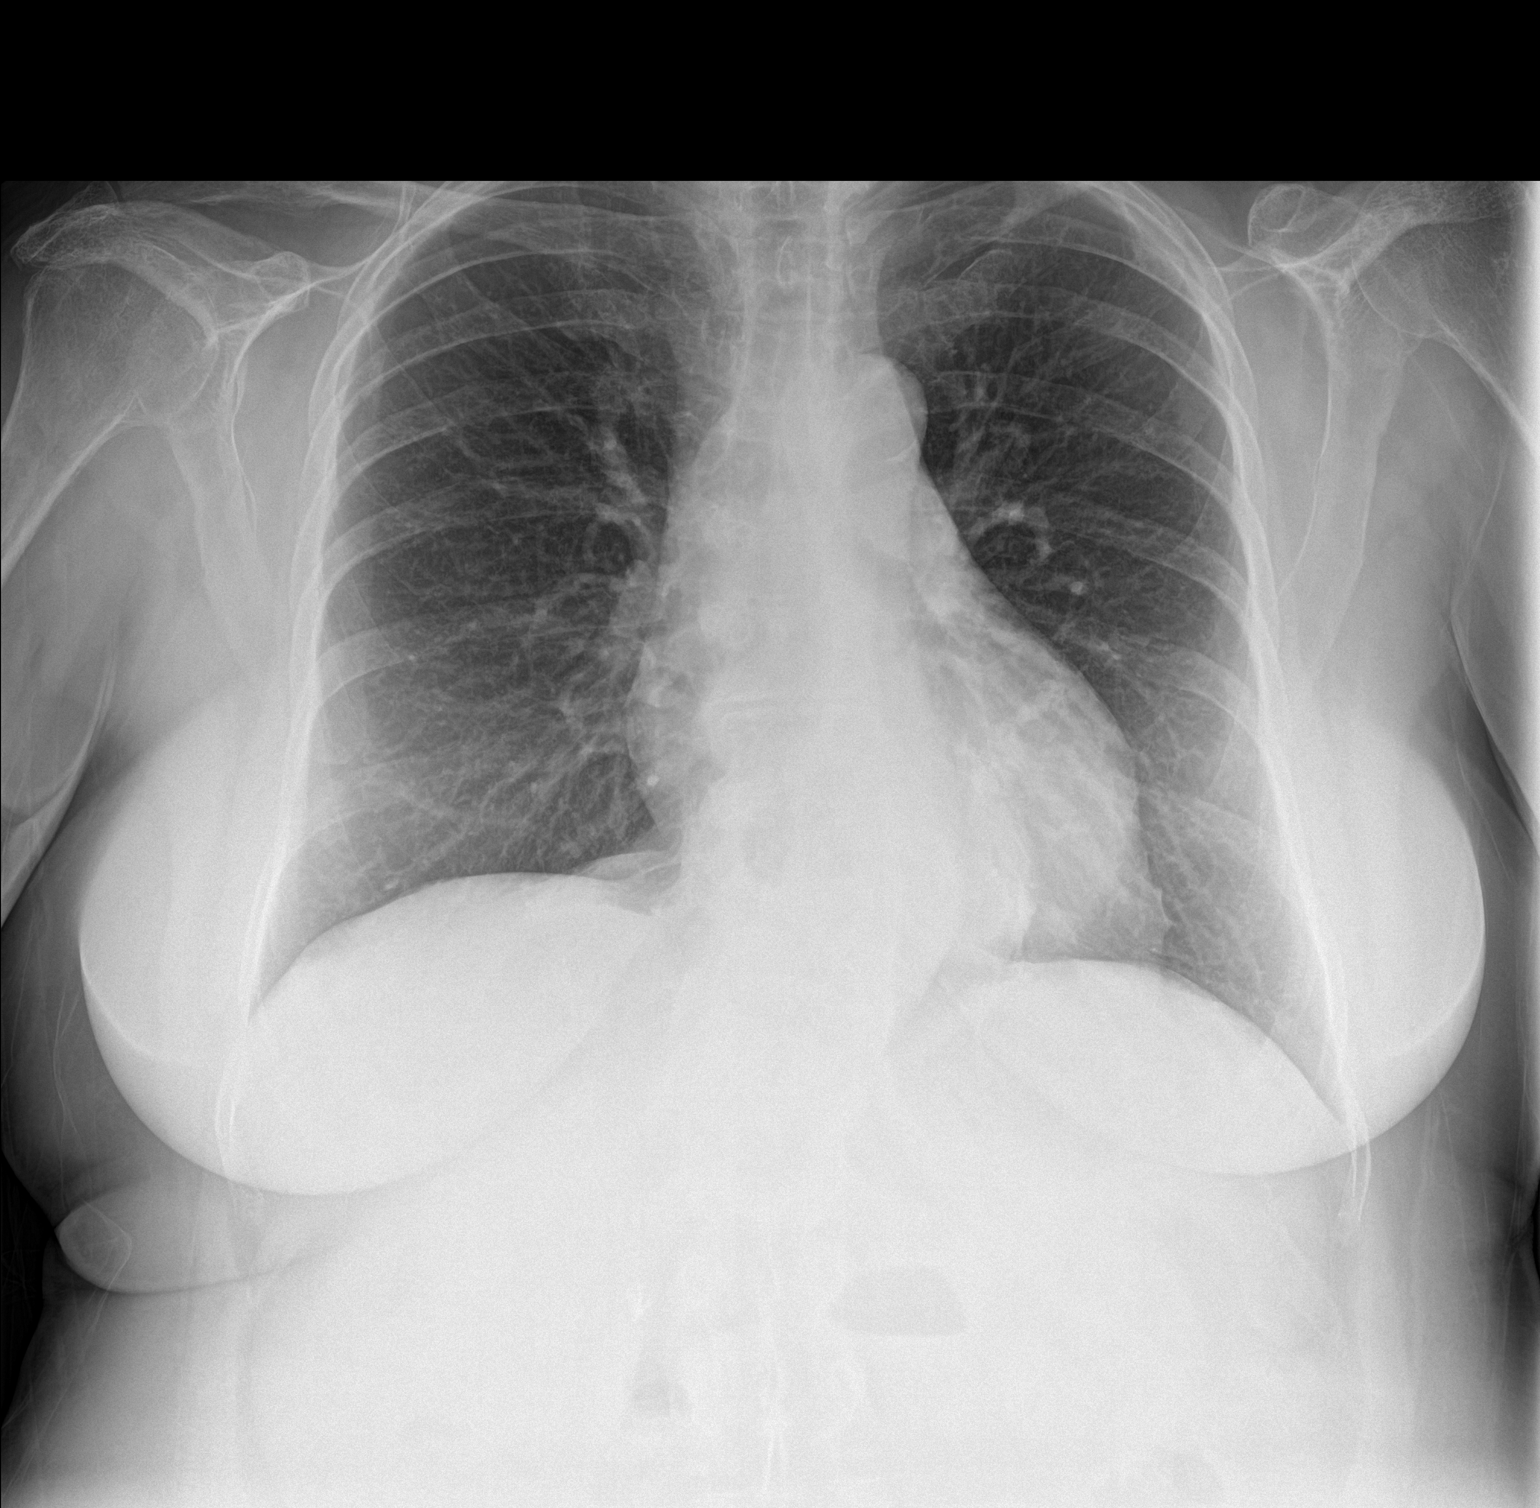

[chest lat]
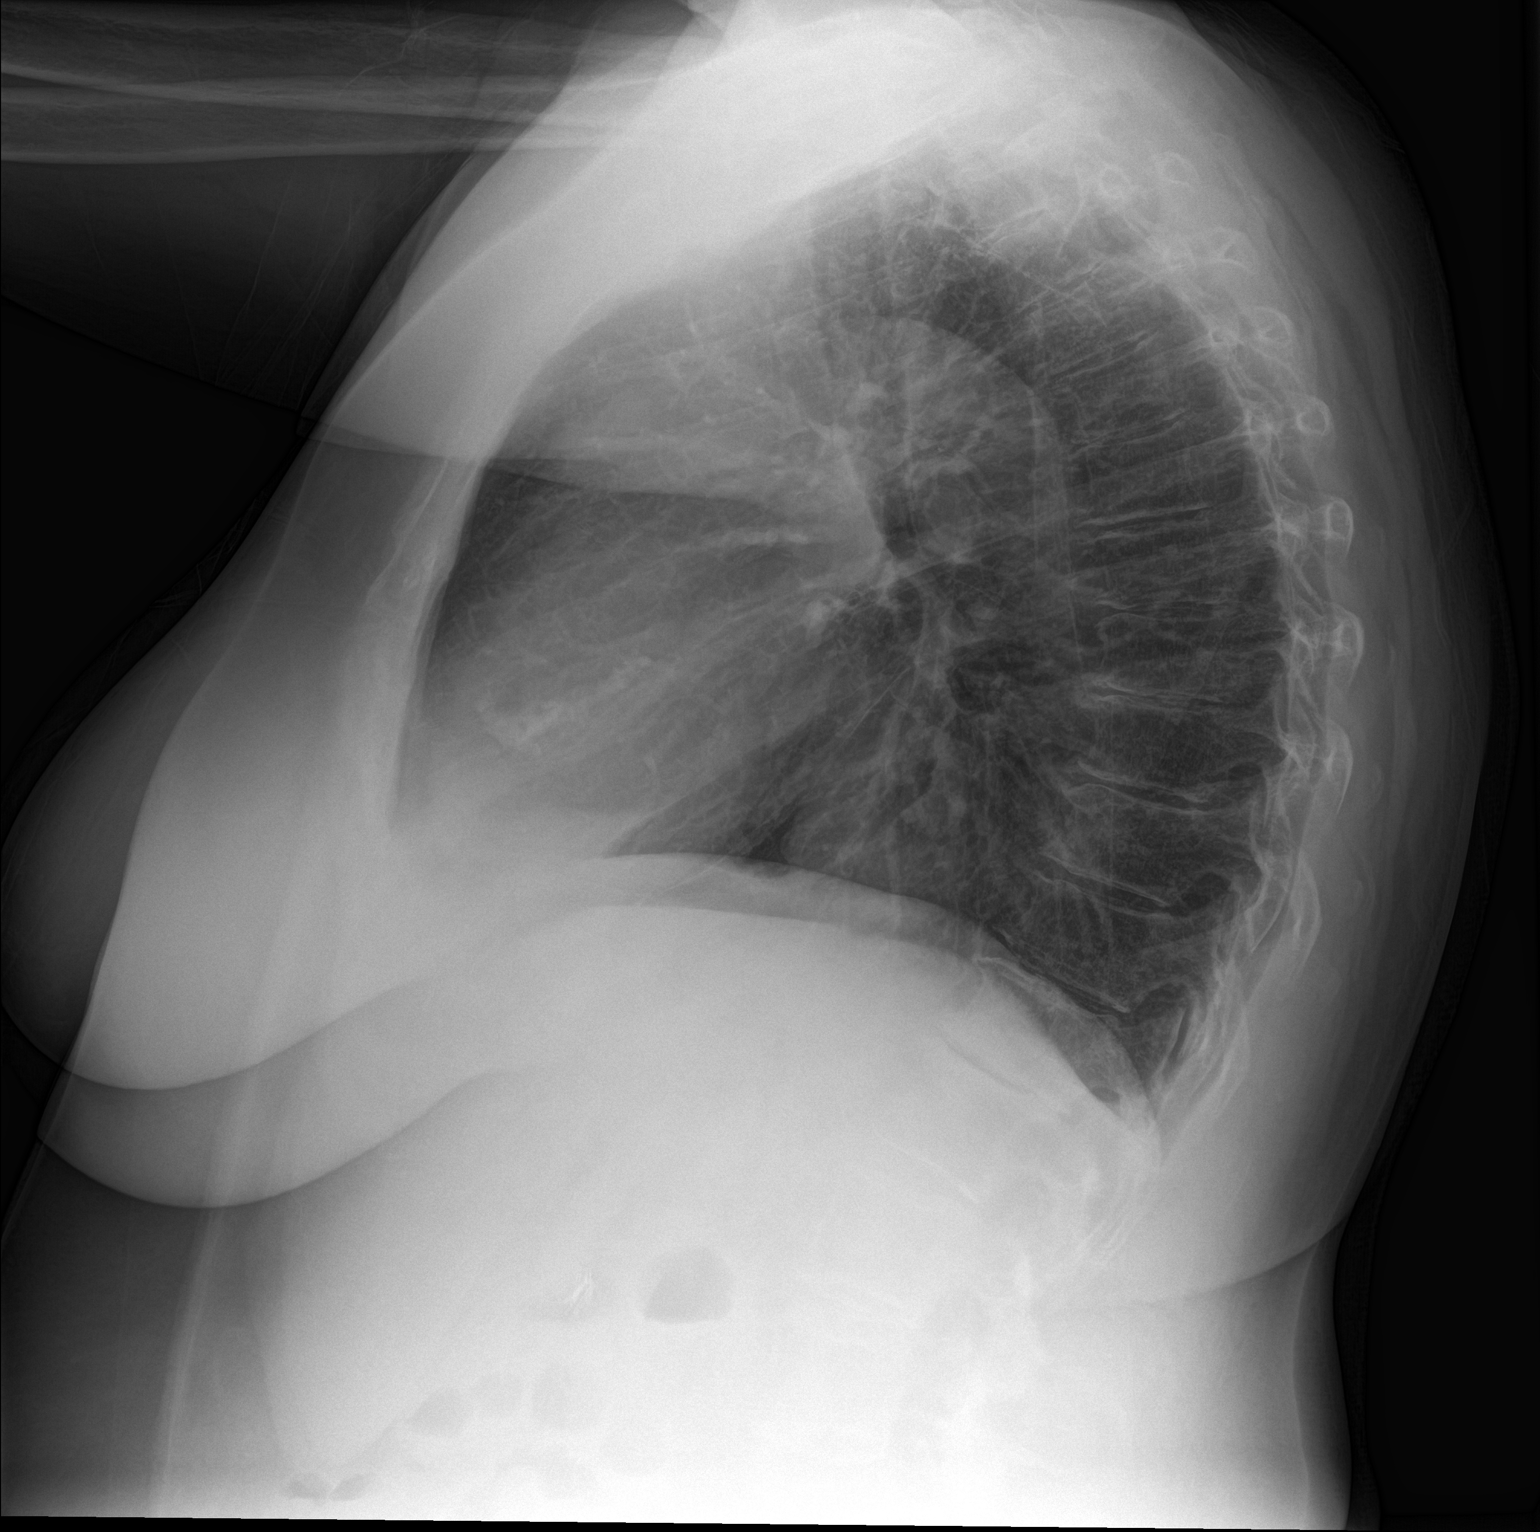

[2 of 2 positions shown; findings below may reference images not displayed]

FINDINGS: Mediastinum and hilar structures normal. Heart size normal. Lungs
are clear of acute infiltrates. No pleural effusion or pneumothorax.
Hiatal hernia. No acute bony abnormality.
IMPRESSION: 1. No acute cardiopulmonary disease.
2. Hiatal hernia.

## 2016-11-06 IMAGING — CR DG CHEST 1V
1 series · 1 of 1 positions shown · non-contrast
Comparison: PA and lateral chest x-ray April 05, 2015

CLINICAL DATA: Status post vaginal hysterectomy, complaints of
dizziness and nausea in the postoperative state, history of cardiac
dysrhythmia and hypertension

EXAM:
CHEST 1 VIEW

[ap]
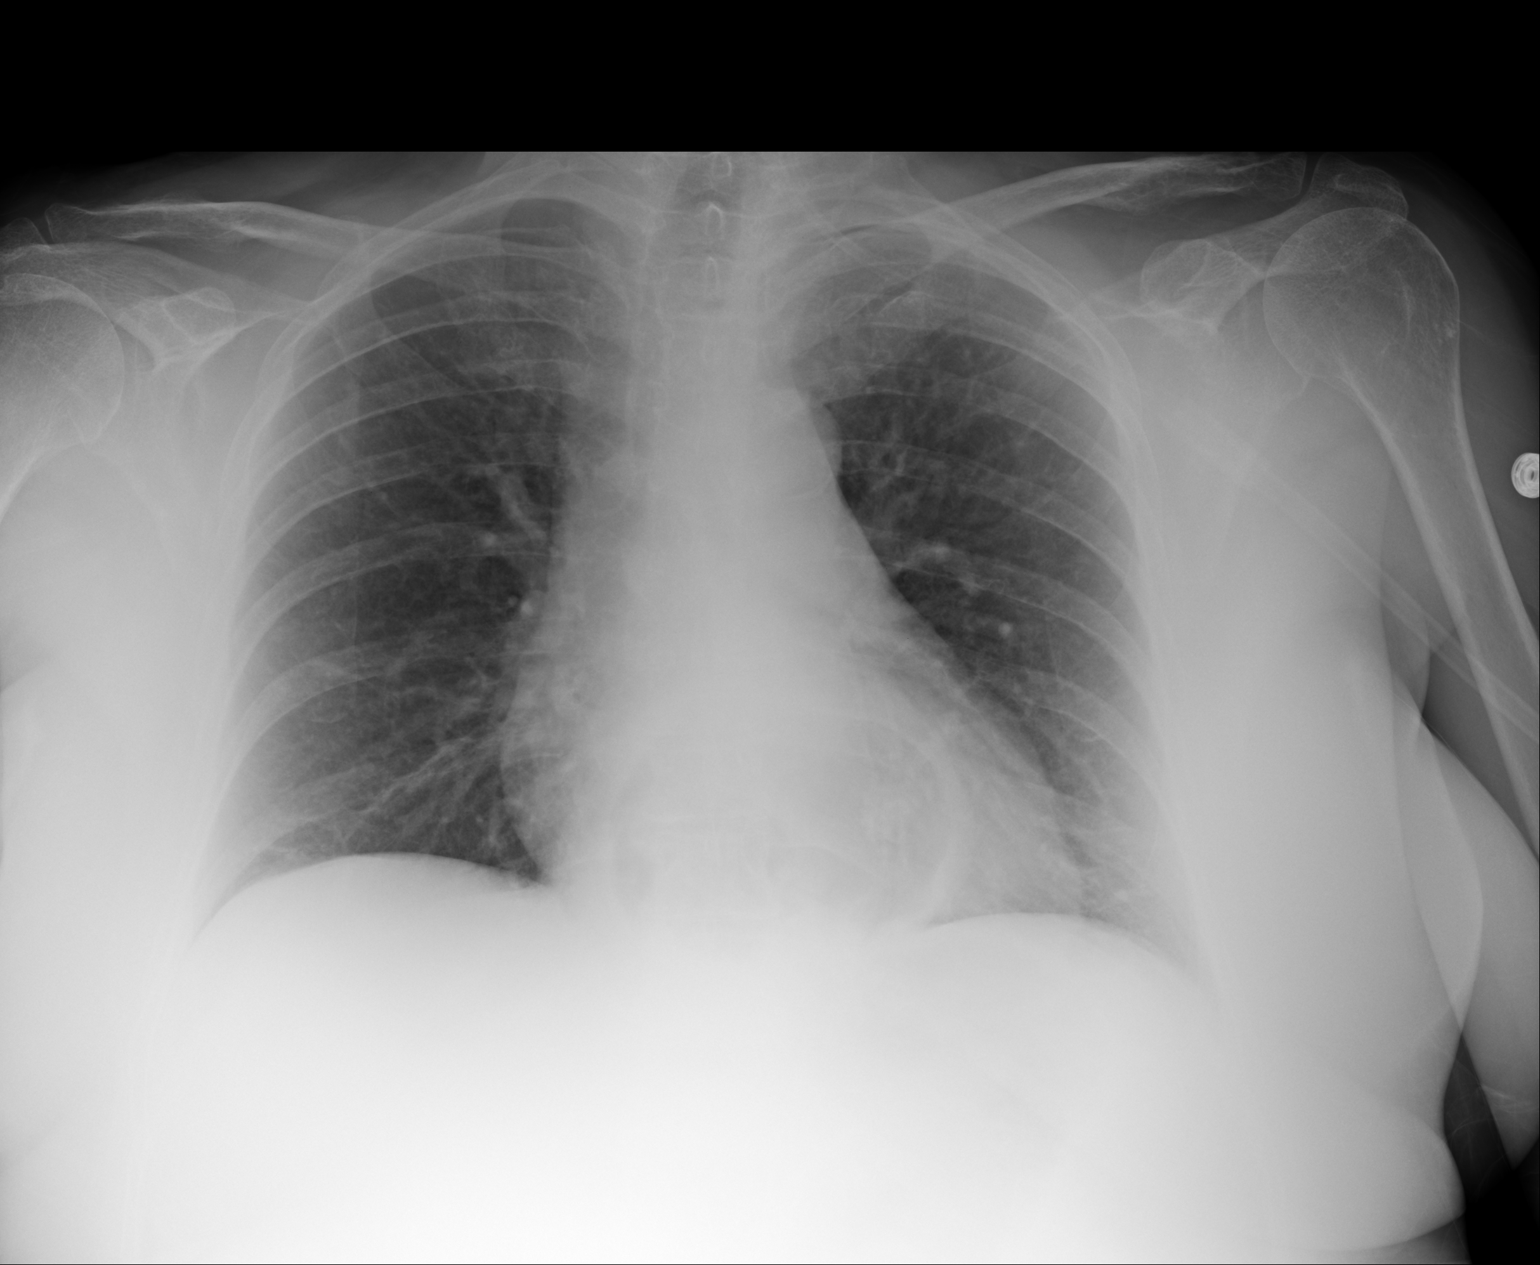

[1 of 1 positions shown; findings below may reference images not displayed]

FINDINGS: The lungs are well-expanded and clear. There is distention of the
hiatal hernia with gas. The heart and pulmonary vascularity are
normal. The mediastinum is normal in width. The trachea is midline.
There is no pleural effusion or pneumothorax. The bony thorax is
unremarkable.
IMPRESSION: There is mild gaseous distention of a known hiatal hernia. There is
no active cardiopulmonary disease.

## 2016-12-09 ENCOUNTER — Other Ambulatory Visit: Payer: Self-pay

## 2016-12-10 ENCOUNTER — Encounter: Payer: Self-pay | Admitting: Physician Assistant

## 2016-12-10 NOTE — Progress Notes (Deleted)
Patient: Kelly Nguyen, Female    DOB: January 29, 1956, 61 y.o.   MRN: [id number] Visit Date: 12/10/2016  Today's Provider: Trinna Post, PA-C   No chief complaint on file.  Subjective:    Annual physical exam Kelly Nguyen is a 61 y.o. female who presents today for health maintenance and complete physical. She feels {DESC; WELL/FAIRLY WELL/POORLY:18703}. She reports exercising ***. She reports she is sleeping {DESC; WELL/FAIRLY WELL/POORLY:18703}.  ----------------------------------------------------------------- Tdap: 04/25/2013 Hysterectomy: 04/10/2015   Review of Systems  Constitutional: Negative.   HENT: Negative.   Eyes: Negative.   Respiratory: Negative.   Cardiovascular: Negative.   Gastrointestinal: Negative.   Endocrine: Negative.   Genitourinary: Negative.   Musculoskeletal: Negative.   Skin: Negative.   Allergic/Immunologic: Negative.   Neurological: Negative.   Hematological: Negative.   Psychiatric/Behavioral: Negative.     Social History      She  reports that she has never smoked. She has never used smokeless tobacco. She reports that she drinks about 4.2 oz of alcohol per week . She reports that she does not use drugs.       Social History   Social History  . Marital status: Married    Spouse name: N/A  . Number of children: 1  . Years of education: N/A   Occupational History  . Full-Time     Retiring in March 2017   Social History Main Topics  . Smoking status: Never Smoker  . Smokeless tobacco: Never Used  . Alcohol use 4.2 oz/week    7 Glasses of wine per week  . Drug use: No  . Sexual activity: Yes    Birth control/ protection: Post-menopausal   Other Topics Concern  . Not on file   Social History Narrative  . No narrative on file    Past Medical History:  Diagnosis Date  . Dysrhythmia   . Hypertension   . Prolapse of uterus 2012  . Stress incontinence 2012     Patient Active Problem List   Diagnosis Date Noted  .  Lumbar radiculopathy 03/28/2016  . S/P hysterectomy 04/10/2015  . Uterine prolapse 04/09/2015  . Closed fracture of ankle 04/09/2015  . Abnormal EKG 04/09/2015  . Pre-op evaluation 04/09/2015  . Arthralgia of multiple joints 10/12/2007  . Colon, diverticulosis 08/28/2003  . Cardiac murmur 08/28/2003  . Diaphragmatic hernia 06/22/2003  . Essential (primary) hypertension 08/17/2002    Past Surgical History:  Procedure Laterality Date  . CHOLECYSTECTOMY    . CYSTOCELE REPAIR N/A 04/10/2015   Procedure: ANTERIOR REPAIR (CYSTOCELE);  Surgeon: Gae Dry, MD;  Location: [facility] ORS;  Service: Gynecology;  Laterality: N/A;  . HALLUX VALGUS CORRECTION Right 2004  . ROTATOR CUFF REPAIR Right 2004  . SALPINGOOPHORECTOMY Bilateral 04/10/2015   Procedure: SALPINGO OOPHORECTOMY;  Surgeon: Gae Dry, MD;  Location: [facility] ORS;  Service: Gynecology;  Laterality: Bilateral;  vaginal  . VAGINAL HYSTERECTOMY  04/10/2015   Procedure: HYSTERECTOMY VAGINAL, ;  Surgeon: Gae Dry, MD;  Location: [facility] ORS;  Service: Gynecology;;    Family History        Family Status  Relation Status  . Mother Alive  . Father Deceased at age 75  . Sister Alive        Her family history includes CAD in her father; Diabetes in her other; Healthy in her mother; Heart disease in her father; Mental illness in her mother.     Allergies  Allergen Reactions  . Meloxicam Rash  Current Outpatient Prescriptions:  .  B Complex Vitamins (VITAMIN B COMPLEX PO), Take 1 tablet by mouth every morning., Disp: , Rfl:  .  celecoxib (CELEBREX) 200 MG capsule, celecoxib 200 mg capsule, Disp: , Rfl:  .  Fish Oil OIL, 1 capsule by Does not apply route every morning., Disp: , Rfl:  .  fluticasone (FLONASE) 50 MCG/ACT nasal spray, Place into both nostrils daily as needed for allergies or rhinitis., Disp: , Rfl:  .  lisinopril-hydrochlorothiazide (PRINZIDE,ZESTORETIC) 10-12.5 MG tablet, Take 1 tablet by mouth daily., Disp:  90 tablet, Rfl: 0 .  MAGNESIUM OXIDE PO, Take 1 tablet by mouth every morning., Disp: , Rfl:  .  Multiple Vitamins-Minerals (MULTIVITAMIN WITH MINERALS) tablet, Take 1 tablet by mouth daily., Disp: , Rfl:  .  Omeprazole Magnesium (PRILOSEC OTC PO), Take 0.25 tablets by mouth every morning., Disp: , Rfl:  .  Probiotic Product (PROBIOTIC DAILY PO), Take 1 capsule by mouth every morning., Disp: , Rfl:  .  RESVERATROL PO, Take 1 capsule by mouth every morning., Disp: , Rfl:  .  traMADol (ULTRAM) 50 MG tablet, tramadol 50 mg tablet, Disp: , Rfl:    Patient Care Team: Paulene Floor as PCP - General (Physician Assistant)      Objective:   Vitals: There were no vitals taken for this visit.  There were no vitals filed for this visit.   Physical Exam   Depression Screen No flowsheet data found.    Assessment & Plan:     Routine Health Maintenance and Physical Exam  Exercise Activities and Dietary recommendations Goals    None      Immunization History  Administered Date(s) Administered  . Td 09/19/2004  . Tdap 04/25/2013    Health Maintenance  Topic Date Due  . Hepatitis C Screening  05-27-1956  . HIV Screening  08/03/1968  . PAP SMEAR  08/04/1974  . MAMMOGRAM  08/04/2003  . COLONOSCOPY  08/04/2003  . INFLUENZA VACCINE  12/31/2016  . TETANUS/TDAP  04/26/2023     Discussed health benefits of physical activity, and encouraged her to engage in regular exercise appropriate for her age and condition.    --------------------------------------------------------------------    Trinna Post, PA-C  [place]

## 2016-12-11 ENCOUNTER — Encounter: Payer: Self-pay | Admitting: Physician Assistant

## 2016-12-11 ENCOUNTER — Other Ambulatory Visit: Payer: Self-pay | Admitting: Physician Assistant

## 2016-12-11 ENCOUNTER — Ambulatory Visit (INDEPENDENT_AMBULATORY_CARE_PROVIDER_SITE_OTHER): Payer: BLUE CROSS/BLUE SHIELD | Admitting: Physician Assistant

## 2016-12-11 VITALS — BP 106/60 | HR 64 | Temp 97.7°F | Resp 16 | Ht 66.0 in | Wt 184.0 lb

## 2016-12-11 DIAGNOSIS — M1990 Unspecified osteoarthritis, unspecified site: Secondary | ICD-10-CM | POA: Diagnosis not present

## 2016-12-11 DIAGNOSIS — I10 Essential (primary) hypertension: Secondary | ICD-10-CM | POA: Diagnosis not present

## 2016-12-11 DIAGNOSIS — Z1239 Encounter for other screening for malignant neoplasm of breast: Secondary | ICD-10-CM

## 2016-12-11 DIAGNOSIS — Z1231 Encounter for screening mammogram for malignant neoplasm of breast: Secondary | ICD-10-CM

## 2016-12-11 DIAGNOSIS — Z1211 Encounter for screening for malignant neoplasm of colon: Secondary | ICD-10-CM | POA: Diagnosis not present

## 2016-12-11 DIAGNOSIS — Z0000 Encounter for general adult medical examination without abnormal findings: Secondary | ICD-10-CM

## 2016-12-11 MED ORDER — CELECOXIB 200 MG PO CAPS: 200.0000 mg | ORAL_CAPSULE | Freq: Every day | ORAL | 1 refills | Status: DC

## 2016-12-11 NOTE — Patient Instructions (Signed)

## 2016-12-11 NOTE — Progress Notes (Signed)
Patient: Kelly Nguyen, Female    DOB: 10/22/1955, 61 y.o.   MRN: [id number] Visit Date: 12/11/2016  Today's Provider: Trinna Post, PA-C   Chief Complaint  Patient presents with  . Annual Exam   Subjective:    Annual physical exam Kelly Nguyen is a 61 y.o. female who presents today for health maintenance and complete physical. She feels well. She has noticed easy bruising. She reports exercising twice weekly; she goes to water aerobics. She is also active with yard work. She reports she is sleeping well.   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  She has been married for 28 years. She has one child from a previous marriage and two step children from her husband's previous marriage. She has one grandchild.   Father deceased Mother living 73, schizophrenia  She does not smoke, does not use tobacco products. She drinks a "large pour" of wine per night. She does not use drugs.   She has had a hysterectomy performed by Dr. Kenton Kingfisher for prolapsed uterus.  She has a history of high blood pressure with cardiology referral. Saw Dr. Nehemiah Massed in 2015 for this, had echo and cardiac cath. No signs of ischemic disease or blockages per patient.   Last CPE- 12/19/2010 Last pap- 12/19/2010- Negative; HPV negative/ S/P hysterectomy Last mammogram- 03/13/2015- BI-RADS 1 Last colonoscopy- 08/18/2003- hyperplastic polyp, diverticulosis. -----------------------------------------------------------------   Review of Systems  Constitutional: Negative.   HENT: Negative.   Eyes: Negative.   Respiratory: Negative.   Cardiovascular: Negative.   Gastrointestinal: Negative.   Endocrine: Negative.   Genitourinary: Negative.   Musculoskeletal: Negative.   Skin: Negative.   Allergic/Immunologic: Negative.   Neurological: Negative.   Hematological: Negative for adenopathy. Bruises/bleeds easily.  Psychiatric/Behavioral: Negative.     Social History      She  reports  that she has never smoked. She has never used smokeless tobacco. She reports that she drinks about 4.2 oz of alcohol per week . She reports that she does not use drugs.       Social History   Social History  . Marital status: Married    Spouse name: Broadus John  . Number of children: 1  . Years of education: some college   Occupational History  . Retired     from [employer]  . Smoking status: Never Smoker  . Smokeless tobacco: Never Used  . Alcohol use 4.2 oz/week    7 Glasses of wine per week  . Drug use: No  . Sexual activity: Yes    Birth control/ protection: Surgical   Other Topics Concern  . None   Social History Narrative  . None    Past Medical History:  Diagnosis Date  . Dysrhythmia   . Hypertension   . Prolapse of uterus 2012  . Stress incontinence 2012     Patient Active Problem List   Diagnosis Date Noted  . Lumbar radiculopathy 03/28/2016  . S/P hysterectomy 04/10/2015  . Uterine prolapse 04/09/2015  . Closed fracture of ankle 04/09/2015  . Abnormal EKG 04/09/2015  . Pre-op evaluation 04/09/2015  . Arthralgia of multiple joints 10/12/2007  . Colon, diverticulosis 08/28/2003  . Cardiac murmur 08/28/2003  . Diaphragmatic hernia 06/22/2003  . Essential (primary) hypertension 08/17/2002    Past Surgical History:  Procedure Laterality Date  . ABDOMINAL HYSTERECTOMY    . CARDIAC CATHETERIZATION    . CHOLECYSTECTOMY    .  CYSTOCELE REPAIR N/A 04/10/2015   Procedure: ANTERIOR REPAIR (CYSTOCELE);  Surgeon: Gae Dry, MD;  Location: [facility] ORS;  Service: Gynecology;  Laterality: N/A;  . HALLUX VALGUS CORRECTION Right 2004  . HAMMER TOE SURGERY    . ROTATOR CUFF REPAIR Right 2004  . SALPINGOOPHORECTOMY Bilateral 04/10/2015   Procedure: SALPINGO OOPHORECTOMY;  Surgeon: Gae Dry, MD;  Location: [facility] ORS;  Service: Gynecology;  Laterality: Bilateral;  vaginal  . VAGINAL HYSTERECTOMY  04/10/2015   Procedure: HYSTERECTOMY  VAGINAL, ;  Surgeon: Gae Dry, MD;  Location: [facility] ORS;  Service: Gynecology;;    Family History        Family Status  Relation Status  . Mother Alive  . Father Deceased at age 99  . Sister Alive  . Other (Not Specified)        Her family history includes CAD in her father; Depression in her sister; Diabetes in her other; Healthy in her mother; Heart disease in her father; Mental illness in her mother; Migraines in her sister.     Allergies  Allergen Reactions  . Meloxicam Rash     Current Outpatient Prescriptions:  .  fluticasone (FLONASE) 50 MCG/ACT nasal spray, Place into both nostrils daily as needed for allergies or rhinitis., Disp: , Rfl:  .  lisinopril-hydrochlorothiazide (PRINZIDE,ZESTORETIC) 10-12.5 MG tablet, Take 1 tablet by mouth daily., Disp: 90 tablet, Rfl: 0 .  Multiple Vitamins-Minerals (MULTIVITAMIN WITH MINERALS) tablet, Take 1 tablet by mouth daily., Disp: , Rfl:  .  Omeprazole Magnesium (PRILOSEC OTC PO), Take 0.25 tablets by mouth every morning., Disp: , Rfl:  .  Probiotic Product (PROBIOTIC DAILY PO), Take 1 capsule by mouth every morning., Disp: , Rfl:  .  celecoxib (CELEBREX) 200 MG capsule, Take 1 capsule (200 mg total) by mouth daily., Disp: 90 capsule, Rfl: 1   Patient Care Team: Paulene Floor as PCP - General (Physician Assistant)      Objective:   Vitals: BP 106/60 (BP Location: Left Arm, Patient Position: Sitting, Cuff Size: Normal)   Pulse 64   Temp 97.7 F (36.5 C) (Oral)   Resp 16   Ht 5\' 6"  (1.676 m)   Wt 184 lb (83.5 kg)   BMI 29.70 kg/m    Vitals:   12/11/16 1004  BP: 106/60  Pulse: 64  Resp: 16  Temp: 97.7 F (36.5 C)  TempSrc: Oral  Weight: 184 lb (83.5 kg)  Height: 5\' 6"  (1.676 m)     Physical Exam  Constitutional: She is oriented to person, place, and time. She appears well-developed and well-nourished.  HENT:  Right Ear: Tympanic membrane normal.  Left Ear: Tympanic membrane normal.  Mouth/Throat:  Oropharynx is clear and moist. No oropharyngeal exudate.  Eyes: Conjunctivae are normal.  Neck: Neck supple. No thyromegaly present.  Cardiovascular: Normal rate and regular rhythm.   Pulmonary/Chest: Effort normal and breath sounds normal.  Abdominal: Soft. Bowel sounds are normal.  Lymphadenopathy:    She has no cervical adenopathy.  Neurological: She is alert and oriented to person, place, and time.  Skin: Skin is warm and dry.  Psychiatric: She has a normal mood and affect. Her behavior is normal.     Depression Screen PHQ 2/9 Scores 12/11/2016  PHQ - 2 Score 0      Assessment & Plan:     Routine Health Maintenance and Physical Exam  Exercise Activities and Dietary recommendations Goals    None      Immunization History  Administered Date(s) Administered  . Td 09/19/2004  . Tdap 04/25/2013    Health Maintenance  Topic Date Due  . Hepatitis C Screening  February 17, 1956  . HIV Screening  08/03/1968  . PAP SMEAR  08/04/1974  . MAMMOGRAM  08/04/2003  . COLONOSCOPY  08/04/2003  . INFLUENZA VACCINE  12/31/2016  . TETANUS/TDAP  04/26/2023     Discussed health benefits of physical activity, and encouraged her to engage in regular exercise appropriate for her age and condition.    1. Annual physical exam  Will request records from Dr. Alveria Apley office.  - TSH  2. Essential (primary) hypertension  Stable on Prinzide.   - Comprehensive metabolic panel  3. Breast cancer screening  Ordered today, declined breast exam.   - MM DIGITAL SCREENING BILATERAL; Future  4. Colon cancer screening  - Ambulatory referral to Gastroenterology  5. Arthritis  Refilled, counseled on GI risk.   - celecoxib (CELEBREX) 200 MG capsule; Take 1 capsule (200 mg total) by mouth daily.  Dispense: 90 capsule; Refill: 1    --------------------------------------------------------------------    Trinna Post, PA-C  [facility]

## 2016-12-12 ENCOUNTER — Telehealth: Payer: Self-pay

## 2016-12-12 ENCOUNTER — Telehealth: Payer: Self-pay | Admitting: Physician Assistant

## 2016-12-12 LAB — LIPID PANEL
Chol/HDL Ratio: 4.4 ratio (ref 0.0–4.4)
Cholesterol, Total: 206 mg/dL — ABNORMAL HIGH (ref 100–199)
HDL: 47 mg/dL (ref >39)
LDL Calculated: 132 mg/dL — ABNORMAL HIGH (ref 0–99)
Triglycerides: 133 mg/dL (ref 0–149)
VLDL Cholesterol Cal: 27 mg/dL (ref 5–40)

## 2016-12-12 LAB — COMPREHENSIVE METABOLIC PANEL
ALT: 38 IU/L — ABNORMAL HIGH (ref 0–32)
AST: 31 IU/L (ref 0–40)
Albumin/Globulin Ratio: 1.8 (ref 1.2–2.2)
Albumin: 4.5 g/dL (ref 3.6–4.8)
Alkaline Phosphatase: 95 IU/L (ref 39–117)
BUN/Creatinine Ratio: 22 (ref 12–28)
BUN: 19 mg/dL (ref 8–27)
Bilirubin Total: 0.5 mg/dL (ref 0.0–1.2)
CO2: 26 mmol/L (ref 20–29)
Calcium: 9.7 mg/dL (ref 8.7–10.3)
Chloride: 100 mmol/L (ref 96–106)
Creatinine, Ser: 0.85 mg/dL (ref 0.57–1.00)
GFR calc Af Amer: 84 mL/min/{1.73_m2} (ref >59)
GFR calc non Af Amer: 73 mL/min/{1.73_m2} (ref >59)
Globulin, Total: 2.5 g/dL (ref 1.5–4.5)
Glucose: 99 mg/dL (ref 65–99)
Potassium: 4.1 mmol/L (ref 3.5–5.2)
Sodium: 141 mmol/L (ref 134–144)
Total Protein: 7 g/dL (ref 6.0–8.5)

## 2016-12-12 LAB — CBC WITH DIFFERENTIAL/PLATELET
Basophils Absolute: 0 10*3/uL (ref 0.0–0.2)
Basos: 0 %
EOS (ABSOLUTE): 0.1 10*3/uL (ref 0.0–0.4)
Eos: 3 %
Hematocrit: 36.7 % (ref 34.0–46.6)
Hemoglobin: 11.9 g/dL (ref 11.1–15.9)
Immature Grans (Abs): 0 10*3/uL (ref 0.0–0.1)
Immature Granulocytes: 0 %
Lymphocytes Absolute: 1.5 10*3/uL (ref 0.7–3.1)
Lymphs: 29 %
MCH: 29.2 pg (ref 26.6–33.0)
MCHC: 32.4 g/dL (ref 31.5–35.7)
MCV: 90 fL (ref 79–97)
Monocytes Absolute: 0.5 10*3/uL (ref 0.1–0.9)
Monocytes: 9 %
Neutrophils Absolute: 3 10*3/uL (ref 1.4–7.0)
Neutrophils: 59 %
Platelets: 290 10*3/uL (ref 150–379)
RBC: 4.07 x10E6/uL (ref 3.77–5.28)
RDW: 14.5 % (ref 12.3–15.4)
WBC: 5.1 10*3/uL (ref 3.4–10.8)

## 2016-12-12 LAB — TSH: TSH: 1.47 u[IU]/mL (ref 0.450–4.500)

## 2016-12-12 NOTE — Telephone Encounter (Signed)
Advised patient of results.  

## 2016-12-12 NOTE — Telephone Encounter (Signed)
ROI faxed to [facility] Dr Nehemiah Massed  Rec'd records from [facility] forwarded 8 pages to Dr. Coralyn Pear

## 2016-12-12 NOTE — Telephone Encounter (Signed)
-----   Message from Trinna Post, [place] sent at 12/12/2016  8:28 AM EDT ----- TSH, CMET, CBC normal. Cholesterol slightly high with 4.6% risk of cardiovascular event in 10 years, though not requiring treatment yet. Encourage heart healthy diet and continuing with moderate exercise.

## 2016-12-19 ENCOUNTER — Encounter: Payer: Self-pay | Admitting: Physician Assistant

## 2016-12-24 ENCOUNTER — Ambulatory Visit
Admission: RE | Admit: 2016-12-24 | Discharge: 2016-12-24 | Disposition: A | Discharge status: Home / Self Care | Payer: BLUE CROSS/BLUE SHIELD | Source: Ambulatory Visit | Attending: Physician Assistant | Admitting: Physician Assistant

## 2016-12-24 DIAGNOSIS — N6310 Unspecified lump in the right breast, unspecified quadrant: Secondary | ICD-10-CM | POA: Insufficient documentation

## 2016-12-24 DIAGNOSIS — R928 Other abnormal and inconclusive findings on diagnostic imaging of breast: Secondary | ICD-10-CM | POA: Diagnosis not present

## 2016-12-24 DIAGNOSIS — Z1231 Encounter for screening mammogram for malignant neoplasm of breast: Secondary | ICD-10-CM | POA: Insufficient documentation

## 2016-12-26 ENCOUNTER — Telehealth: Payer: Self-pay

## 2016-12-26 ENCOUNTER — Other Ambulatory Visit: Payer: Self-pay

## 2016-12-26 DIAGNOSIS — Z1211 Encounter for screening for malignant neoplasm of colon: Secondary | ICD-10-CM

## 2016-12-26 NOTE — Telephone Encounter (Signed)
Gastroenterology Pre-Procedure Review  Request Date:  Requesting Physician: Dr.   PATIENT REVIEW QUESTIONS: The patient responded to the following health history questions as indicated:    1. Are you having any GI issues? no 2. Do you have a personal history of Polyps? no 3. Do you have a family history of Colon Cancer or Polyps? no 4. Diabetes Mellitus? no 5. Joint replacements in the past 12 months?no 6. Major health problems in the past 3 months?no 7. Any artificial heart valves, MVP, or defibrillator?no    MEDICATIONS & ALLERGIES:    Patient reports the following regarding taking any anticoagulation/antiplatelet therapy:   Plavix, Coumadin, Eliquis, Xarelto, Lovenox, Pradaxa, Brilinta, or Effient? no Aspirin? no  Patient confirms/reports the following medications:  Current Outpatient Prescriptions  Medication Sig Dispense Refill  . celecoxib (CELEBREX) 200 MG capsule Take 1 capsule (200 mg total) by mouth daily. 90 capsule 1  . fluticasone (FLONASE) 50 MCG/ACT nasal spray Place into both nostrils daily as needed for allergies or rhinitis.    Marland Kitchen lisinopril-hydrochlorothiazide (PRINZIDE,ZESTORETIC) 10-12.5 MG tablet Take 1 tablet by mouth daily. 90 tablet 0  . Multiple Vitamins-Minerals (MULTIVITAMIN WITH MINERALS) tablet Take 1 tablet by mouth daily.    . Omeprazole Magnesium (PRILOSEC OTC PO) Take 0.25 tablets by mouth every morning.    . Probiotic Product (PROBIOTIC DAILY PO) Take 1 capsule by mouth every morning.     No current facility-administered medications for this visit.     Patient confirms/reports the following allergies:  Allergies  Allergen Reactions  . Meloxicam Rash    No orders of the defined types were placed in this encounter.   AUTHORIZATION INFORMATION Primary Insurance: 1D#: Group #:  Secondary Insurance: 1D#: Group #:  SCHEDULE INFORMATION: Date: 02/27/17 Time: Location: [place]

## 2016-12-30 ENCOUNTER — Other Ambulatory Visit: Payer: Self-pay | Admitting: *Deleted

## 2016-12-30 ENCOUNTER — Inpatient Hospital Stay
Admission: RE | Admit: 2016-12-30 | Discharge: 2016-12-30 | Disposition: A | Discharge status: Home / Self Care | Payer: Self-pay | Source: Ambulatory Visit | Attending: *Deleted | Admitting: *Deleted

## 2016-12-30 DIAGNOSIS — Z9289 Personal history of other medical treatment: Secondary | ICD-10-CM

## 2017-01-01 ENCOUNTER — Other Ambulatory Visit: Payer: Self-pay | Admitting: Physician Assistant

## 2017-01-01 DIAGNOSIS — R928 Other abnormal and inconclusive findings on diagnostic imaging of breast: Secondary | ICD-10-CM

## 2017-01-01 DIAGNOSIS — N6310 Unspecified lump in the right breast, unspecified quadrant: Secondary | ICD-10-CM

## 2017-01-07 ENCOUNTER — Other Ambulatory Visit: Payer: Self-pay | Admitting: Physician Assistant

## 2017-01-07 DIAGNOSIS — I10 Essential (primary) hypertension: Secondary | ICD-10-CM

## 2017-01-14 ENCOUNTER — Ambulatory Visit
Admission: RE | Admit: 2017-01-14 | Discharge: 2017-01-14 | Disposition: A | Discharge status: Home / Self Care | Payer: BLUE CROSS/BLUE SHIELD | Source: Ambulatory Visit | Attending: Physician Assistant | Admitting: Physician Assistant

## 2017-01-14 DIAGNOSIS — N6310 Unspecified lump in the right breast, unspecified quadrant: Secondary | ICD-10-CM | POA: Diagnosis present

## 2017-01-14 DIAGNOSIS — R928 Other abnormal and inconclusive findings on diagnostic imaging of breast: Secondary | ICD-10-CM | POA: Diagnosis present

## 2017-01-14 DIAGNOSIS — N6001 Solitary cyst of right breast: Secondary | ICD-10-CM | POA: Diagnosis not present

## 2017-02-27 ENCOUNTER — Encounter
Admission: RE | Disposition: A | Discharge status: Home / Self Care | Payer: Self-pay | Source: Ambulatory Visit | Attending: Gastroenterology

## 2017-02-27 ENCOUNTER — Ambulatory Visit: Payer: BLUE CROSS/BLUE SHIELD | Admitting: Anesthesiology

## 2017-02-27 ENCOUNTER — Ambulatory Visit
Admission: RE | Admit: 2017-02-27 | Discharge: 2017-02-27 | Disposition: A | Discharge status: Home / Self Care | Payer: BLUE CROSS/BLUE SHIELD | Source: Ambulatory Visit | Attending: Gastroenterology | Admitting: Gastroenterology

## 2017-02-27 ENCOUNTER — Encounter: Payer: Self-pay | Admitting: *Deleted

## 2017-02-27 DIAGNOSIS — D122 Benign neoplasm of ascending colon: Secondary | ICD-10-CM | POA: Insufficient documentation

## 2017-02-27 DIAGNOSIS — K5730 Diverticulosis of large intestine without perforation or abscess without bleeding: Secondary | ICD-10-CM | POA: Diagnosis not present

## 2017-02-27 DIAGNOSIS — Z79899 Other long term (current) drug therapy: Secondary | ICD-10-CM | POA: Insufficient documentation

## 2017-02-27 DIAGNOSIS — Z818 Family history of other mental and behavioral disorders: Secondary | ICD-10-CM | POA: Insufficient documentation

## 2017-02-27 DIAGNOSIS — Z90710 Acquired absence of both cervix and uterus: Secondary | ICD-10-CM | POA: Diagnosis not present

## 2017-02-27 DIAGNOSIS — K640 First degree hemorrhoids: Secondary | ICD-10-CM | POA: Diagnosis not present

## 2017-02-27 DIAGNOSIS — Z803 Family history of malignant neoplasm of breast: Secondary | ICD-10-CM | POA: Diagnosis not present

## 2017-02-27 DIAGNOSIS — Z8249 Family history of ischemic heart disease and other diseases of the circulatory system: Secondary | ICD-10-CM | POA: Diagnosis not present

## 2017-02-27 DIAGNOSIS — K5790 Diverticulosis of intestine, part unspecified, without perforation or abscess without bleeding: Secondary | ICD-10-CM | POA: Diagnosis not present

## 2017-02-27 DIAGNOSIS — Z833 Family history of diabetes mellitus: Secondary | ICD-10-CM | POA: Insufficient documentation

## 2017-02-27 DIAGNOSIS — Z888 Allergy status to other drugs, medicaments and biological substances status: Secondary | ICD-10-CM | POA: Insufficient documentation

## 2017-02-27 DIAGNOSIS — I499 Cardiac arrhythmia, unspecified: Secondary | ICD-10-CM | POA: Insufficient documentation

## 2017-02-27 DIAGNOSIS — Z1211 Encounter for screening for malignant neoplasm of colon: Secondary | ICD-10-CM | POA: Diagnosis not present

## 2017-02-27 DIAGNOSIS — Z9049 Acquired absence of other specified parts of digestive tract: Secondary | ICD-10-CM | POA: Insufficient documentation

## 2017-02-27 DIAGNOSIS — I10 Essential (primary) hypertension: Secondary | ICD-10-CM | POA: Insufficient documentation

## 2017-02-27 DIAGNOSIS — D125 Benign neoplasm of sigmoid colon: Secondary | ICD-10-CM | POA: Insufficient documentation

## 2017-02-27 DIAGNOSIS — K5140 Inflammatory polyps of colon without complications: Secondary | ICD-10-CM | POA: Diagnosis not present

## 2017-02-27 HISTORY — PX: COLONOSCOPY WITH PROPOFOL: SHX5780

## 2017-02-27 SURGERY — COLONOSCOPY WITH PROPOFOL
Anesthesia: General

## 2017-02-27 MED ORDER — MIDAZOLAM HCL 2 MG/2ML IJ SOLN
INTRAMUSCULAR | Status: DC | PRN
  Administered 2017-02-27 (×2): 1 mg via INTRAVENOUS

## 2017-02-27 MED ORDER — LIDOCAINE HCL (PF) 2 % IJ SOLN
INTRAMUSCULAR | Status: AC
  Filled 2017-02-27: qty 4

## 2017-02-27 MED ORDER — PROPOFOL 10 MG/ML IV BOLUS
INTRAVENOUS | Status: DC | PRN
  Administered 2017-02-27: 10 mg via INTRAVENOUS
  Administered 2017-02-27: 70 mg via INTRAVENOUS

## 2017-02-27 MED ORDER — SODIUM CHLORIDE 0.9 % IV SOLN
INTRAVENOUS | Status: DC
  Administered 2017-02-27: 08:00:00 via INTRAVENOUS

## 2017-02-27 MED ORDER — LIDOCAINE HCL (CARDIAC) 20 MG/ML IV SOLN
INTRAVENOUS | Status: DC | PRN
  Administered 2017-02-27: 40 mg via INTRAVENOUS

## 2017-02-27 MED ORDER — PROPOFOL 500 MG/50ML IV EMUL
INTRAVENOUS | Status: DC | PRN
  Administered 2017-02-27: 140 ug/kg/min via INTRAVENOUS

## 2017-02-27 MED ORDER — MIDAZOLAM HCL 2 MG/2ML IJ SOLN
INTRAMUSCULAR | Status: AC
  Filled 2017-02-27: qty 2

## 2017-02-27 MED ORDER — EPHEDRINE SULFATE 50 MG/ML IJ SOLN
INTRAMUSCULAR | Status: DC | PRN
  Administered 2017-02-27 (×2): 5 mg via INTRAVENOUS

## 2017-02-27 NOTE — H&P (Signed)
Jonathon Bellows MD [address] [place], [place] [address] Phone: [phone] Fax : [phone]  Primary Care Physician:  Paulene Floor Primary Gastroenterologist:  Dr. Jonathon Bellows   Pre-Procedure History & Physical: HPI:  Kelly Nguyen is a 61 y.o. female is here for an colonoscopy.   Past Medical History:  Diagnosis Date  . Dysrhythmia   . Hypertension   . Prolapse of uterus 2012  . Stress incontinence 2012    Past Surgical History:  Procedure Laterality Date  . ABDOMINAL HYSTERECTOMY    . CARDIAC CATHETERIZATION    . CHOLECYSTECTOMY    . CYSTOCELE REPAIR N/A 04/10/2015   Procedure: ANTERIOR REPAIR (CYSTOCELE);  Surgeon: Gae Dry, MD;  Location: [facility] ORS;  Service: Gynecology;  Laterality: N/A;  . HALLUX VALGUS CORRECTION Right 2004  . HAMMER TOE SURGERY    . ROTATOR CUFF REPAIR Right 2004  . SALPINGOOPHORECTOMY Bilateral 04/10/2015   Procedure: SALPINGO OOPHORECTOMY;  Surgeon: Gae Dry, MD;  Location: [facility] ORS;  Service: Gynecology;  Laterality: Bilateral;  vaginal  . VAGINAL HYSTERECTOMY  04/10/2015   Procedure: HYSTERECTOMY VAGINAL, ;  Surgeon: Gae Dry, MD;  Location: [facility] ORS;  Service: Gynecology;;    Prior to Admission medications   Medication Sig Start Date End Date Taking? Authorizing Provider  celecoxib (CELEBREX) 200 MG capsule Take 1 capsule (200 mg total) by mouth daily. 12/11/16  Yes Carles Collet M, PA-C  fluticasone (FLONASE) 50 MCG/ACT nasal spray Place into both nostrils daily as needed for allergies or rhinitis.   Yes [provider]  lisinopril-hydrochlorothiazide (PRINZIDE,ZESTORETIC) 10-12.5 MG tablet TAKE ONE TABLET EVERY DAY 01/07/17  Yes Carles Collet M, PA-C  Multiple Vitamins-Minerals (MULTIVITAMIN WITH MINERALS) tablet Take 1 tablet by mouth daily.   Yes [provider]  Omeprazole Magnesium (PRILOSEC OTC PO) Take 0.25 tablets by mouth every morning.   Yes [provider]  Probiotic  Product (PROBIOTIC DAILY PO) Take 1 capsule by mouth every morning.   Yes [provider]    Allergies as of 12/26/2016 - Review Complete 12/11/2016  Allergen Reaction Noted  . Meloxicam Rash     Family History  Problem Relation Age of Onset  . Healthy Mother   . Mental illness Mother   . Heart disease Father   . CAD Father   . Depression Sister   . Migraines Sister   . Diabetes Other   . Breast cancer Paternal Grandmother 30    Social History   Social History  . Marital status: Married    Spouse name: Broadus John  . Number of children: 1  . Years of education: some college   Occupational History  . Retired     from [employer]  . Smoking status: Never Smoker  . Smokeless tobacco: Never Used  . Alcohol use 4.2 oz/week    7 Glasses of wine per week  . Drug use: No  . Sexual activity: Yes    Birth control/ protection: Surgical   Other Topics Concern  . Not on file   Social History Narrative  . No narrative on file    Review of Systems: See HPI, otherwise negative ROS  Physical Exam: BP (!) 152/87   Pulse 70   Temp (!) 97.5 F (36.4 C) (Tympanic)   Resp 16   Ht 5\' 6"  (1.676 m)   Wt 184 lb (83.5 kg)   SpO2 99%   BMI 29.70 kg/m  General:  Alert,  pleasant and cooperative in NAD Head:  Normocephalic and atraumatic. Neck:  Supple; no masses or thyromegaly. Lungs:  Clear throughout to auscultation.    Heart:  Regular rate and rhythm. Abdomen:  Soft, nontender and nondistended. Normal bowel sounds, without guarding, and without rebound.   Neurologic:  Alert and  oriented x4;  grossly normal neurologically.  Impression/Plan: Kelly Nguyen is here for an colonoscopy to be performed for Screening colonoscopy average risk    Risks, benefits, limitations, and alternatives regarding  colonoscopy have been reviewed with the patient.  Questions have been answered.  All parties agreeable.   Jonathon Bellows, MD  02/27/2017, 7:29 AM

## 2017-02-27 NOTE — Transfer of Care (Signed)
Immediate Anesthesia Transfer of Care Note  Patient: Kelly Nguyen  Procedure(s) Performed: Procedure(s): COLONOSCOPY WITH PROPOFOL (N/A)  Patient Location: PACU  Anesthesia Type:General  Level of Consciousness: awake, alert  and oriented  Airway & Oxygen Therapy: Patient Spontanous Breathing and Patient connected to nasal cannula oxygen  Post-op Assessment: Report given to RN and Post -op Vital signs reviewed and stable  Post vital signs: Reviewed and stable  Last Vitals:  Vitals:   02/27/17 0722 02/27/17 0921  BP: (!) 152/87 100/67  Pulse: 70 74  Resp: 16 15  Temp: (!) 36.4 C (!) 35.7 C  SpO2: 99% 96%    Last Pain:  Vitals:   02/27/17 0921  TempSrc: Tympanic         Complications: No apparent anesthesia complications

## 2017-02-27 NOTE — Op Note (Signed)
[facility] Gastroenterology Patient Name: Kelly Nguyen Procedure Date: 02/27/2017 8:38 AM MRN: [id number] Account #: [id number] Date of Birth: 1955/08/21 Admit Type: Outpatient Age: 61 Room: [facility] ENDO ROOM 4 Gender: Female Note Status: Finalized Procedure:            Colonoscopy Indications:          Screening for colorectal malignant neoplasm Providers:            Jonathon Bellows MD, MD Referring MD:         Kipp Brood. Margretta Sidle (Referring MD) Medicines:            Monitored Anesthesia Care Complications:        No immediate complications. Procedure:            Pre-Anesthesia Assessment:                       - Prior to the procedure, a History and Physical was                        performed, and patient medications, allergies and                        sensitivities were reviewed. The patient's tolerance of                        previous anesthesia was reviewed.                       - The risks and benefits of the procedure and the                        sedation options and risks were discussed with the                        patient. All questions were answered and informed                        consent was obtained.                       - ASA Grade Assessment: III - A patient with severe                        systemic disease.                       After obtaining informed consent, the colonoscope was                        passed under direct vision. Throughout the procedure,                        the patient's blood pressure, pulse, and oxygen                        saturations were monitored continuously. The                        Colonoscope was introduced through the anus and  advanced to the the cecum, identified by the                        appendiceal orifice, IC valve and transillumination.                        The colonoscopy was performed with moderate difficulty                        due to a tortuous colon. The patient  tolerated the                        procedure well. The quality of the bowel preparation                        was adequate. Findings:      The perianal and digital rectal examinations were normal.      Multiple medium-mouthed diverticula were found in the sigmoid colon.      Non-bleeding internal hemorrhoids were found during retroflexion. The       hemorrhoids were medium-sized and Grade I (internal hemorrhoids that do       not prolapse).      A 3 mm polyp was found in the sigmoid colon. The polyp was sessile. The       polyp was removed with a cold biopsy forceps. Resection and retrieval       were complete.      Two sessile polyps were found in the ascending colon. The polyps were 6       to 8 mm in size. These polyps were removed with a cold snare. Resection       and retrieval were complete. To prevent bleeding after the polypectomy,       two hemostatic clips were successfully placed. There was no bleeding       during, or at the end, of the procedure.      The exam was otherwise without abnormality on direct and retroflexion       views. Impression:           - Diverticulosis in the sigmoid colon.                       - Non-bleeding internal hemorrhoids.                       - One 3 mm polyp in the sigmoid colon, removed with a                        cold biopsy forceps. Resected and retrieved.                       - Two 6 to 8 mm polyps in the ascending colon, removed                        with a cold snare. Resected and retrieved. Clips were                        placed.                       - The examination was otherwise normal on direct and  retroflexion views. Recommendation:       - Discharge patient to home (with escort).                       - Resume previous diet.                       - Continue present medications.                       - Await pathology results.                       - Repeat colonoscopy for surveillance based on                         pathology results.                       - Return to GI office PRN. Procedure Code(s):    --- Professional ---                       [phone], Colonoscopy, flexible; with removal of tumor(s),                        polyp(s), or other lesion(s) by snare technique                       [id number], 38, Colonoscopy, flexible; with biopsy, single                        or multiple Diagnosis Code(s):    --- Professional ---                       Z12.11, Encounter for screening for malignant neoplasm                        of colon                       K64.0, First degree hemorrhoids                       D12.5, Benign neoplasm of sigmoid colon                       D12.2, Benign neoplasm of ascending colon                       K57.30, Diverticulosis of large intestine without                        perforation or abscess without bleeding CPT copyright 2016 American Medical Association. All rights reserved. The codes documented in this report are preliminary and upon coder review may  be revised to meet current compliance requirements. Jonathon Bellows, MD Jonathon Bellows MD, MD 02/27/2017 9:19:59 AM This report has been signed electronically. Number of Addenda: 0 Note Initiated On: 02/27/2017 8:38 AM Scope Withdrawal Time: 0 hours 17 minutes 11 seconds  Total Procedure Duration: 0 hours 35 minutes 28 seconds       [facility]

## 2017-02-27 NOTE — Anesthesia Post-op Follow-up Note (Signed)
Anesthesia QCDR form completed.        

## 2017-02-27 NOTE — Anesthesia Preprocedure Evaluation (Signed)
Anesthesia Evaluation  Patient identified by MRN, date of birth, ID band Patient awake    Reviewed: Allergy & Precautions, H&P , NPO status , Patient's Chart, lab work & pertinent test results  History of Anesthesia Complications Negative for: history of anesthetic complications  Airway Mallampati: III  TM Distance: >3 FB Neck ROM: full    Dental  (+) Teeth Intact   Pulmonary neg pulmonary ROS, neg shortness of breath,           Cardiovascular Exercise Tolerance: Good hypertension, (-) angina(-) Past MI and (-) DOE + dysrhythmias + Valvular Problems/Murmurs MVP      Neuro/Psych  Neuromuscular disease negative psych ROS   GI/Hepatic negative GI ROS, Neg liver ROS,   Endo/Other  negative endocrine ROS  Renal/GU negative Renal ROS  negative genitourinary   Musculoskeletal   Abdominal   Peds  Hematology negative hematology ROS (+)   Anesthesia Other Findings Past Medical History: No date: Dysrhythmia No date: Hypertension 2012: Prolapse of uterus 2012: Stress incontinence  Past Surgical History: No date: ABDOMINAL HYSTERECTOMY No date: CARDIAC CATHETERIZATION No date: CHOLECYSTECTOMY 04/10/2015: CYSTOCELE REPAIR; N/A     Comment:  Procedure: ANTERIOR REPAIR (CYSTOCELE);  Surgeon: Gae Dry, MD;  Location: [facility] ORS;  Service: Gynecology;               Laterality: N/A; 2004: HALLUX VALGUS CORRECTION; Right No date: HAMMER TOE SURGERY 2004: ROTATOR CUFF REPAIR; Right 04/10/2015: SALPINGOOPHORECTOMY; Bilateral     Comment:  Procedure: SALPINGO OOPHORECTOMY;  Surgeon: Gae Dry, MD;  Location: [facility] ORS;  Service: Gynecology;                Laterality: Bilateral;  vaginal 04/10/2015: VAGINAL HYSTERECTOMY     Comment:  Procedure: HYSTERECTOMY VAGINAL, ;  Surgeon: Gae Dry, MD;  Location: [facility] ORS;  Service: Gynecology;;  BMI    Body Mass Index:  29.70  kg/m      Reproductive/Obstetrics negative OB ROS                             Anesthesia Physical Anesthesia Plan  ASA: III  Anesthesia Plan: General   Post-op Pain Management:    Induction: Intravenous  PONV Risk Score and Plan: Propofol infusion  Airway Management Planned: Natural Airway and Nasal Cannula  Additional Equipment:   Intra-op Plan:   Post-operative Plan:   Informed Consent: I have reviewed the patients History and Physical, chart, labs and discussed the procedure including the risks, benefits and alternatives for the proposed anesthesia with the patient or authorized representative who has indicated his/her understanding and acceptance.   Dental Advisory Given  Plan Discussed with: Anesthesiologist, CRNA and Surgeon  Anesthesia Plan Comments: (Patient consented for risks of anesthesia including but not limited to:  - adverse reactions to medications - risk of intubation if required - damage to teeth, lips or other oral mucosa - sore throat or hoarseness - Damage to heart, brain, lungs or loss of life  Patient voiced understanding.)        Anesthesia Quick Evaluation

## 2017-02-27 NOTE — Anesthesia Postprocedure Evaluation (Signed)
Anesthesia Post Note  Patient: Kelly Nguyen  Procedure(s) Performed: Procedure(s) (LRB): COLONOSCOPY WITH PROPOFOL (N/A)  Patient location during evaluation: Endoscopy Anesthesia Type: General Level of consciousness: awake and alert Pain management: pain level controlled Vital Signs Assessment: post-procedure vital signs reviewed and stable Respiratory status: spontaneous breathing, nonlabored ventilation, respiratory function stable and patient connected to nasal cannula oxygen Cardiovascular status: blood pressure returned to baseline and stable Postop Assessment: no apparent nausea or vomiting Anesthetic complications: no     Last Vitals:  Vitals:   02/27/17 0951 02/27/17 1001  BP: 131/78 127/72  Pulse: 63 (!) 55  Resp: 16 15  Temp:    SpO2: 98% 100%    Last Pain:  Vitals:   02/27/17 0921  TempSrc: Tympanic                 Precious Haws Malaney Mcbean

## 2017-03-02 ENCOUNTER — Encounter: Payer: Self-pay | Admitting: Gastroenterology

## 2017-03-02 LAB — SURGICAL PATHOLOGY

## 2017-03-17 ENCOUNTER — Other Ambulatory Visit: Payer: Self-pay | Admitting: Physician Assistant

## 2017-03-17 DIAGNOSIS — I10 Essential (primary) hypertension: Secondary | ICD-10-CM

## 2017-03-17 DIAGNOSIS — M1990 Unspecified osteoarthritis, unspecified site: Secondary | ICD-10-CM

## 2017-05-13 ENCOUNTER — Encounter
Admission: RE | Admit: 2017-05-13 | Discharge: 2017-05-13 | Disposition: A | Discharge status: Home / Self Care | Payer: BLUE CROSS/BLUE SHIELD | Source: Ambulatory Visit | Attending: Orthopedic Surgery | Admitting: Orthopedic Surgery

## 2017-05-13 ENCOUNTER — Other Ambulatory Visit: Payer: Self-pay

## 2017-05-13 DIAGNOSIS — Z01810 Encounter for preprocedural cardiovascular examination: Secondary | ICD-10-CM | POA: Insufficient documentation

## 2017-05-13 DIAGNOSIS — Z01812 Encounter for preprocedural laboratory examination: Secondary | ICD-10-CM | POA: Insufficient documentation

## 2017-05-13 DIAGNOSIS — R001 Bradycardia, unspecified: Secondary | ICD-10-CM | POA: Insufficient documentation

## 2017-05-13 HISTORY — DX: Personal history of other diseases of the digestive system: Z87.19

## 2017-05-13 HISTORY — DX: Gastro-esophageal reflux disease without esophagitis: K21.9

## 2017-05-13 HISTORY — DX: Endocarditis, valve unspecified: I38

## 2017-05-13 LAB — CBC
HEMATOCRIT: 39.6 % (ref 35.0–47.0)
Hemoglobin: 13.6 g/dL (ref 12.0–16.0)
MCH: 31 pg (ref 26.0–34.0)
MCHC: 34.2 g/dL (ref 32.0–36.0)
MCV: 90.7 fL (ref 80.0–100.0)
Platelets: 263 10*3/uL (ref 150–440)
RBC: 4.37 MIL/uL (ref 3.80–5.20)
RDW: 13.8 % (ref 11.5–14.5)
WBC: 4.4 10*3/uL (ref 3.6–11.0)

## 2017-05-13 LAB — SURGICAL PCR SCREEN
MRSA, PCR: NEGATIVE
Staphylococcus aureus: POSITIVE — AB

## 2017-05-13 LAB — COMPREHENSIVE METABOLIC PANEL
ALT: 36 U/L (ref 14–54)
AST: 31 U/L (ref 15–41)
Albumin: 4.1 g/dL (ref 3.5–5.0)
Alkaline Phosphatase: 89 U/L (ref 38–126)
Anion gap: 8 (ref 5–15)
BILIRUBIN TOTAL: 0.6 mg/dL (ref 0.3–1.2)
BUN: 22 mg/dL — AB (ref 6–20)
CO2: 29 mmol/L (ref 22–32)
CREATININE: 0.81 mg/dL (ref 0.44–1.00)
Calcium: 9.5 mg/dL (ref 8.9–10.3)
Chloride: 101 mmol/L (ref 101–111)
GFR calc Af Amer: >60 mL/min (ref >60)
Glucose, Bld: 113 mg/dL — ABNORMAL HIGH (ref 65–99)
POTASSIUM: 3.6 mmol/L (ref 3.5–5.1)
Sodium: 138 mmol/L (ref 135–145)
TOTAL PROTEIN: 7.7 g/dL (ref 6.5–8.1)

## 2017-05-13 LAB — URINALYSIS, ROUTINE W REFLEX MICROSCOPIC
BILIRUBIN URINE: NEGATIVE
GLUCOSE, UA: NEGATIVE mg/dL
HGB URINE DIPSTICK: NEGATIVE
KETONES UR: NEGATIVE mg/dL
Leukocytes, UA: NEGATIVE
Nitrite: NEGATIVE
PROTEIN: NEGATIVE mg/dL
Specific Gravity, Urine: 1.021 (ref 1.005–1.030)
pH: 5 (ref 5.0–8.0)

## 2017-05-13 LAB — SEDIMENTATION RATE: SED RATE: 20 mm/h (ref 0–30)

## 2017-05-13 LAB — TYPE AND SCREEN
ABO/RH(D): A POS
Antibody Screen: NEGATIVE

## 2017-05-13 LAB — PROTIME-INR
INR: 0.94
PROTHROMBIN TIME: 12.5 s (ref 11.4–15.2)

## 2017-05-13 LAB — APTT: aPTT: 35 seconds (ref 24–36)

## 2017-05-13 NOTE — Patient Instructions (Signed)
Your procedure is scheduled on: Wednesday 05/27/17 Report to [place]. 2ND FLOOR MEDICAL MALL ENTRANCE. To find out your arrival time please call [phone] between 1PM - 3PM on Monday 05/25/17.  Remember: Instructions that are not followed completely may result in serious medical risk, up to and including death, or upon the discretion of your surgeon and anesthesiologist your surgery may need to be rescheduled.    __X__ 1. Do not eat anything after midnight the night before your    procedure.  No gum chewing or hard candies.  You may drink clear   liquids up to 2 hours before you are scheduled to arrive at the   hospital for your procedure. Do not drink clear liquids within 2   hours of scheduled arrival to the hospital as this may lead to your   procedure being delayed or rescheduled.       Clear liquids include:   Water or Apple juice without pulp   Clear carbohydrate beverage such as Clearfast or Gatorade   Black coffee or Clear Tea (no milk, no creamer, do not add anything   to the coffee or tea)    Diabetics should only drink water   __X__ 2. No Alcohol for 24 hours before or after surgery.   ____ 3. Bring all medications with you on the day of surgery if instructed.    __X__ 4. Notify your doctor if there is any change in your medical condition     (cold, fever, infections).             __X___5. No smoking within 24 hours of your surgery.     Do not wear jewelry, make-up, hairpins, clips or nail polish.  Do not wear lotions, powders, or perfumes.   Do not shave 48 hours prior to surgery. Men may shave face and neck.  Do not bring valuables to the hospital.    [facility] is not responsible for any belongings or valuables.               Contacts, dentures or bridgework may not be worn into surgery.  Leave your suitcase in the car. After surgery it may be brought to your room.  For patients admitted to the hospital, discharge time is determined by your                 treatment team.   Patients discharged the day of surgery will not be allowed to drive home.   Please read over the following fact sheets that you were given:   MRSA Information   __X__ Take these medicines the morning of surgery with A SIP OF WATER:    1. PRILOSEC  2.   3.   4.  5.  6.  ____ Fleet Enema (as directed)   __X__ Use CHG Soap/SAGE wipes as directed  ____ Use inhalers on the day of surgery  ____ Stop metformin 2 days prior to surgery    ____ Take 1/2 of usual insulin dose the night before surgery and none on the morning of surgery.   ____ Stop Coumadin/Plavix/aspirin on   __X__ Stop Anti-inflammatories such as Advil, Aleve, Ibuprofen, Motrin, Naproxen, Naprosyn, Goodies,powder, or aspirin products.  OK to take Tylenol.   __X__ Stop supplements, Vitamin E, Fish Oil until after surgery.  STOP TURMERIC ALSO 1 WEEK PRIOR  ____ Bring C-Pap to the hospital.

## 2017-05-14 LAB — URINE CULTURE: Special Requests: NORMAL

## 2017-05-14 LAB — C-REACTIVE PROTEIN: CRP: <0.8 mg/dL (ref <1.0)

## 2017-05-14 NOTE — Pre-Procedure Instructions (Signed)
EKG COMPARED WITH OLD 

## 2017-05-26 MED ORDER — TRANEXAMIC ACID 1000 MG/10ML IV SOLN
1000.0000 mg | INTRAVENOUS | Status: AC
  Administered 2017-05-27: 1000 mg via INTRAVENOUS
  Filled 2017-05-26: qty 10

## 2017-05-26 MED ORDER — CEFAZOLIN SODIUM-DEXTROSE 2-4 GM/100ML-% IV SOLN
2.0000 g | INTRAVENOUS | Status: AC
  Administered 2017-05-27: 2 g via INTRAVENOUS

## 2017-05-27 ENCOUNTER — Encounter
Admission: RE | Disposition: A | Discharge status: Home Health | Payer: Self-pay | Source: Ambulatory Visit | Attending: Orthopedic Surgery

## 2017-05-27 ENCOUNTER — Inpatient Hospital Stay: Payer: BLUE CROSS/BLUE SHIELD

## 2017-05-27 ENCOUNTER — Inpatient Hospital Stay: Payer: BLUE CROSS/BLUE SHIELD | Admitting: Anesthesiology

## 2017-05-27 ENCOUNTER — Encounter: Payer: Self-pay | Admitting: Orthopedic Surgery

## 2017-05-27 ENCOUNTER — Inpatient Hospital Stay
Admission: RE | Admit: 2017-05-27 | Discharge: 2017-05-29 | DRG: 470 | Disposition: A | Discharge status: Home Health | Payer: BLUE CROSS/BLUE SHIELD | Source: Ambulatory Visit | Attending: Orthopedic Surgery | Admitting: Orthopedic Surgery

## 2017-05-27 DIAGNOSIS — Z8249 Family history of ischemic heart disease and other diseases of the circulatory system: Secondary | ICD-10-CM | POA: Diagnosis not present

## 2017-05-27 DIAGNOSIS — M1711 Unilateral primary osteoarthritis, right knee: Secondary | ICD-10-CM | POA: Diagnosis present

## 2017-05-27 DIAGNOSIS — K219 Gastro-esophageal reflux disease without esophagitis: Secondary | ICD-10-CM | POA: Diagnosis present

## 2017-05-27 DIAGNOSIS — I10 Essential (primary) hypertension: Secondary | ICD-10-CM | POA: Diagnosis present

## 2017-05-27 DIAGNOSIS — Z96651 Presence of right artificial knee joint: Secondary | ICD-10-CM

## 2017-05-27 DIAGNOSIS — Z96659 Presence of unspecified artificial knee joint: Secondary | ICD-10-CM

## 2017-05-27 HISTORY — PX: KNEE ARTHROPLASTY: SHX992

## 2017-05-27 SURGERY — ARTHROPLASTY, KNEE, TOTAL, USING IMAGELESS COMPUTER-ASSISTED NAVIGATION
Anesthesia: Spinal | Site: Knee | Laterality: Right | Wound class: Clean

## 2017-05-27 MED ORDER — PROPOFOL 500 MG/50ML IV EMUL
INTRAVENOUS | Status: DC | PRN
  Administered 2017-05-27: 60 ug/kg/min via INTRAVENOUS
  Administered 2017-05-27: 80 ug/kg/min via INTRAVENOUS

## 2017-05-27 MED ORDER — OXYCODONE HCL 5 MG PO TABS: 5.0000 mg | ORAL_TABLET | Freq: Once | ORAL | Status: DC | PRN

## 2017-05-27 MED ORDER — LISINOPRIL 10 MG PO TABS
10.0000 mg | ORAL_TABLET | Freq: Every day | ORAL | Status: DC
  Administered 2017-05-28: 10 mg via ORAL
  Filled 2017-05-27: qty 1

## 2017-05-27 MED ORDER — FENTANYL CITRATE (PF) 100 MCG/2ML IJ SOLN
INTRAMUSCULAR | Status: DC | PRN
  Administered 2017-05-27: 100 ug via INTRAVENOUS

## 2017-05-27 MED ORDER — SODIUM CHLORIDE 0.9 % IV SOLN
INTRAVENOUS | Status: DC
  Administered 2017-05-27 – 2017-05-28 (×2): via INTRAVENOUS

## 2017-05-27 MED ORDER — ALUM & MAG HYDROXIDE-SIMETH 200-200-20 MG/5ML PO SUSP: 30.0000 mL | ORAL | Status: DC | PRN

## 2017-05-27 MED ORDER — DEXAMETHASONE SODIUM PHOSPHATE 4 MG/ML IJ SOLN
INTRAMUSCULAR | Status: DC | PRN
  Administered 2017-05-27: 5 mg via INTRAVENOUS

## 2017-05-27 MED ORDER — BUPIVACAINE HCL (PF) 0.5 % IJ SOLN
INTRAMUSCULAR | Status: DC | PRN
  Administered 2017-05-27: 3 mL

## 2017-05-27 MED ORDER — DIPHENHYDRAMINE HCL 12.5 MG/5ML PO ELIX: 12.5000 mg | ORAL_SOLUTION | ORAL | Status: DC | PRN

## 2017-05-27 MED ORDER — HYDROCHLOROTHIAZIDE 12.5 MG PO CAPS
12.5000 mg | ORAL_CAPSULE | Freq: Every day | ORAL | Status: DC
  Administered 2017-05-28: 12.5 mg via ORAL
  Filled 2017-05-27: qty 1

## 2017-05-27 MED ORDER — TRAMADOL HCL 50 MG PO TABS
50.0000 mg | ORAL_TABLET | ORAL | Status: DC | PRN
  Administered 2017-05-27 – 2017-05-29 (×2): 100 mg via ORAL
  Filled 2017-05-27 (×2): qty 2

## 2017-05-27 MED ORDER — FENTANYL CITRATE (PF) 100 MCG/2ML IJ SOLN: 25.0000 ug | INTRAMUSCULAR | Status: DC | PRN

## 2017-05-27 MED ORDER — GLYCOPYRROLATE 0.2 MG/ML IJ SOLN
INTRAMUSCULAR | Status: DC | PRN
  Administered 2017-05-27: 0.2 mg via INTRAVENOUS

## 2017-05-27 MED ORDER — PROPOFOL 500 MG/50ML IV EMUL
INTRAVENOUS | Status: AC
  Filled 2017-05-27: qty 50

## 2017-05-27 MED ORDER — MORPHINE SULFATE (PF) 2 MG/ML IV SOLN: 2.0000 mg | INTRAVENOUS | Status: DC | PRN

## 2017-05-27 MED ORDER — KETAMINE HCL 50 MG/ML IJ SOLN
INTRAMUSCULAR | Status: DC | PRN
  Administered 2017-05-27: 12.5 mg via INTRAMUSCULAR
  Administered 2017-05-27: 25 mg via INTRAMUSCULAR
  Administered 2017-05-27: 12.5 mg via INTRAMUSCULAR

## 2017-05-27 MED ORDER — OXYCODONE HCL 5 MG PO TABS
5.0000 mg | ORAL_TABLET | ORAL | Status: DC | PRN
  Administered 2017-05-27 – 2017-05-28 (×3): 5 mg via ORAL
  Filled 2017-05-27 (×3): qty 1

## 2017-05-27 MED ORDER — CHLORHEXIDINE GLUCONATE 4 % EX LIQD: 60.0000 mL | Freq: Once | CUTANEOUS | Status: DC

## 2017-05-27 MED ORDER — BUPIVACAINE HCL (PF) 0.25 % IJ SOLN
INTRAMUSCULAR | Status: AC
  Filled 2017-05-27: qty 30

## 2017-05-27 MED ORDER — RISAQUAD PO CAPS
1.0000 | ORAL_CAPSULE | Freq: Every day | ORAL | Status: DC
  Administered 2017-05-28: 1 via ORAL
  Filled 2017-05-27 (×2): qty 1

## 2017-05-27 MED ORDER — MIDAZOLAM HCL 5 MG/5ML IJ SOLN
INTRAMUSCULAR | Status: DC | PRN
  Administered 2017-05-27 (×2): 1 mg via INTRAVENOUS

## 2017-05-27 MED ORDER — PHENYLEPHRINE HCL 10 MG/ML IJ SOLN
INTRAMUSCULAR | Status: AC
  Filled 2017-05-27: qty 1

## 2017-05-27 MED ORDER — LISINOPRIL-HYDROCHLOROTHIAZIDE 10-12.5 MG PO TABS: 1.0000 | ORAL_TABLET | Freq: Every day | ORAL | Status: DC

## 2017-05-27 MED ORDER — MENTHOL 3 MG MT LOZG
1.0000 | LOZENGE | OROMUCOSAL | Status: DC | PRN
  Filled 2017-05-27: qty 9

## 2017-05-27 MED ORDER — LACTATED RINGERS IV SOLN
INTRAVENOUS | Status: DC
  Administered 2017-05-27 (×2): via INTRAVENOUS

## 2017-05-27 MED ORDER — CEFAZOLIN SODIUM-DEXTROSE 2-4 GM/100ML-% IV SOLN
INTRAVENOUS | Status: AC
  Filled 2017-05-27: qty 100

## 2017-05-27 MED ORDER — METOCLOPRAMIDE HCL 10 MG PO TABS
10.0000 mg | ORAL_TABLET | Freq: Three times a day (TID) | ORAL | Status: DC
  Administered 2017-05-27 – 2017-05-28 (×4): 10 mg via ORAL
  Filled 2017-05-27 (×6): qty 1

## 2017-05-27 MED ORDER — MIDAZOLAM HCL 2 MG/2ML IJ SOLN
INTRAMUSCULAR | Status: AC
  Filled 2017-05-27: qty 2

## 2017-05-27 MED ORDER — PHENOL 1.4 % MT LIQD
1.0000 | OROMUCOSAL | Status: DC | PRN
  Filled 2017-05-27: qty 177

## 2017-05-27 MED ORDER — ONDANSETRON HCL 4 MG PO TABS: 4.0000 mg | ORAL_TABLET | Freq: Four times a day (QID) | ORAL | Status: DC | PRN

## 2017-05-27 MED ORDER — DEXAMETHASONE SODIUM PHOSPHATE 10 MG/ML IJ SOLN
INTRAMUSCULAR | Status: AC
  Filled 2017-05-27: qty 1

## 2017-05-27 MED ORDER — SENNOSIDES-DOCUSATE SODIUM 8.6-50 MG PO TABS
1.0000 | ORAL_TABLET | Freq: Two times a day (BID) | ORAL | Status: DC
Start: 2017-05-27 — End: 2017-05-29
  Administered 2017-05-27 – 2017-05-28 (×3): 1 via ORAL
  Filled 2017-05-27 (×3): qty 1

## 2017-05-27 MED ORDER — ACETAMINOPHEN 650 MG RE SUPP: 650.0000 mg | RECTAL | Status: DC | PRN

## 2017-05-27 MED ORDER — FENTANYL CITRATE (PF) 100 MCG/2ML IJ SOLN
INTRAMUSCULAR | Status: AC
  Filled 2017-05-27: qty 2

## 2017-05-27 MED ORDER — ADULT MULTIVITAMIN W/MINERALS CH
ORAL_TABLET | Freq: Every day | ORAL | Status: DC
  Administered 2017-05-28: 1 via ORAL
  Filled 2017-05-27: qty 1

## 2017-05-27 MED ORDER — TRANEXAMIC ACID 1000 MG/10ML IV SOLN
1000.0000 mg | Freq: Once | INTRAVENOUS | Status: AC
  Administered 2017-05-27: 1000 mg via INTRAVENOUS
  Filled 2017-05-27: qty 10

## 2017-05-27 MED ORDER — ACETAMINOPHEN 10 MG/ML IV SOLN
1000.0000 mg | Freq: Four times a day (QID) | INTRAVENOUS | Status: AC
  Administered 2017-05-27 – 2017-05-28 (×4): 1000 mg via INTRAVENOUS
  Filled 2017-05-27 (×4): qty 100

## 2017-05-27 MED ORDER — ONDANSETRON HCL 4 MG/2ML IJ SOLN
4.0000 mg | Freq: Four times a day (QID) | INTRAMUSCULAR | Status: DC | PRN
  Administered 2017-05-29: 4 mg via INTRAVENOUS
  Filled 2017-05-27: qty 2

## 2017-05-27 MED ORDER — PROPOFOL 10 MG/ML IV BOLUS
INTRAVENOUS | Status: DC | PRN
  Administered 2017-05-27 (×4): 16 mg via INTRAVENOUS

## 2017-05-27 MED ORDER — ACETAMINOPHEN 10 MG/ML IV SOLN
INTRAVENOUS | Status: AC
  Filled 2017-05-27: qty 100

## 2017-05-27 MED ORDER — CELECOXIB 200 MG PO CAPS
200.0000 mg | ORAL_CAPSULE | Freq: Two times a day (BID) | ORAL | Status: DC
  Administered 2017-05-27 – 2017-05-28 (×4): 200 mg via ORAL
  Filled 2017-05-27 (×4): qty 1

## 2017-05-27 MED ORDER — SODIUM CHLORIDE 0.9 % IV SOLN
INTRAVENOUS | Status: DC | PRN
  Administered 2017-05-27: 60 mL

## 2017-05-27 MED ORDER — LIDOCAINE HCL (PF) 2 % IJ SOLN
INTRAMUSCULAR | Status: AC
  Filled 2017-05-27: qty 10

## 2017-05-27 MED ORDER — MAGNESIUM HYDROXIDE 400 MG/5ML PO SUSP
30.0000 mL | Freq: Every day | ORAL | Status: DC | PRN
  Administered 2017-05-28: 30 mL via ORAL
  Filled 2017-05-27: qty 30

## 2017-05-27 MED ORDER — GLYCOPYRROLATE 0.2 MG/ML IJ SOLN
INTRAMUSCULAR | Status: AC
  Filled 2017-05-27: qty 1

## 2017-05-27 MED ORDER — ACETAMINOPHEN 325 MG PO TABS: 650.0000 mg | ORAL_TABLET | ORAL | Status: DC | PRN

## 2017-05-27 MED ORDER — BISACODYL 10 MG RE SUPP: 10.0000 mg | Freq: Every day | RECTAL | Status: DC | PRN

## 2017-05-27 MED ORDER — OXYCODONE HCL 5 MG/5ML PO SOLN: 5.0000 mg | Freq: Once | ORAL | Status: DC | PRN

## 2017-05-27 MED ORDER — OXYCODONE HCL 5 MG PO TABS
10.0000 mg | ORAL_TABLET | ORAL | Status: DC | PRN
  Administered 2017-05-28 – 2017-05-29 (×5): 10 mg via ORAL
  Filled 2017-05-27 (×5): qty 2

## 2017-05-27 MED ORDER — FLUTICASONE PROPIONATE 50 MCG/ACT NA SUSP
1.0000 | Freq: Every day | NASAL | Status: DC | PRN
  Filled 2017-05-27: qty 16

## 2017-05-27 MED ORDER — BUPIVACAINE HCL (PF) 0.25 % IJ SOLN
INTRAMUSCULAR | Status: DC | PRN
  Administered 2017-05-27: 60 mL

## 2017-05-27 MED ORDER — BUPIVACAINE HCL (PF) 0.5 % IJ SOLN
INTRAMUSCULAR | Status: AC
  Filled 2017-05-27: qty 10

## 2017-05-27 MED ORDER — ACETAMINOPHEN 10 MG/ML IV SOLN
INTRAVENOUS | Status: DC | PRN
  Administered 2017-05-27: 1000 mg via INTRAVENOUS

## 2017-05-27 MED ORDER — KETAMINE HCL 50 MG/ML IJ SOLN
INTRAMUSCULAR | Status: AC
  Filled 2017-05-27: qty 10

## 2017-05-27 MED ORDER — LIDOCAINE HCL (CARDIAC) 20 MG/ML IV SOLN
INTRAVENOUS | Status: DC | PRN
  Administered 2017-05-27: 100 mg via INTRAVENOUS

## 2017-05-27 MED ORDER — SODIUM CHLORIDE 0.9 % IV SOLN
INTRAVENOUS | Status: DC | PRN
  Administered 2017-05-27: 30 ug/min via INTRAVENOUS

## 2017-05-27 MED ORDER — NEOMYCIN-POLYMYXIN B GU 40-200000 IR SOLN
Status: DC | PRN
  Administered 2017-05-27: 14 mL

## 2017-05-27 MED ORDER — PANTOPRAZOLE SODIUM 40 MG PO TBEC
40.0000 mg | DELAYED_RELEASE_TABLET | Freq: Two times a day (BID) | ORAL | Status: DC
  Administered 2017-05-27 – 2017-05-28 (×3): 40 mg via ORAL
  Filled 2017-05-27 (×2): qty 1

## 2017-05-27 MED ORDER — FERROUS SULFATE 325 (65 FE) MG PO TABS
325.0000 mg | ORAL_TABLET | Freq: Two times a day (BID) | ORAL | Status: DC
  Administered 2017-05-28 – 2017-05-29 (×3): 325 mg via ORAL
  Filled 2017-05-27 (×3): qty 1

## 2017-05-27 MED ORDER — ENOXAPARIN SODIUM 30 MG/0.3ML ~~LOC~~ SOLN
30.0000 mg | Freq: Two times a day (BID) | SUBCUTANEOUS | Status: DC
  Administered 2017-05-28 – 2017-05-29 (×3): 30 mg via SUBCUTANEOUS
  Filled 2017-05-27 (×4): qty 0.3

## 2017-05-27 MED ORDER — DEXTROSE 5 % IV SOLN
2.0000 g | Freq: Four times a day (QID) | INTRAVENOUS | Status: AC
  Administered 2017-05-27 – 2017-05-28 (×4): 2 g via INTRAVENOUS
  Filled 2017-05-27 (×4): qty 20

## 2017-05-27 MED ORDER — FLEET ENEMA 7-19 GM/118ML RE ENEM: 1.0000 | ENEMA | Freq: Once | RECTAL | Status: DC | PRN

## 2017-05-27 SURGICAL SUPPLY — 62 items
BATTERY INSTRU NAVIGATION (MISCELLANEOUS) ×8 IMPLANT
BLADE SAW 1 (BLADE) ×2 IMPLANT
BLADE SAW 1/2 (BLADE) ×2 IMPLANT
BLADE SAW 70X12.5 (BLADE) IMPLANT
CANISTER SUCT 1200ML W/VALVE (MISCELLANEOUS) ×2 IMPLANT
CANISTER SUCT 3000ML PPV (MISCELLANEOUS) ×4 IMPLANT
CAPT KNEE TOTAL 3 ATTUNE ×2 IMPLANT
CEMENT HV SMART SET (Cement) ×4 IMPLANT
COOLER POLAR GLACIER W/PUMP (MISCELLANEOUS) ×2 IMPLANT
CUFF TOURN 24 STER (MISCELLANEOUS) IMPLANT
CUFF TOURN 30 STER DUAL PORT (MISCELLANEOUS) IMPLANT
DRAPE SHEET LG 3/4 BI-LAMINATE (DRAPES) ×2 IMPLANT
DRSG DERMACEA 8X12 NADH (GAUZE/BANDAGES/DRESSINGS) ×2 IMPLANT
DRSG OPSITE POSTOP 4X14 (GAUZE/BANDAGES/DRESSINGS) ×2 IMPLANT
DRSG TEGADERM 4X4.75 (GAUZE/BANDAGES/DRESSINGS) ×2 IMPLANT
DURAPREP 26ML APPLICATOR (WOUND CARE) ×4 IMPLANT
ELECT CAUTERY BLADE 6.4 (BLADE) ×2 IMPLANT
ELECT REM PT RETURN 9FT ADLT (ELECTROSURGICAL) ×2
ELECTRODE REM PT RTRN 9FT ADLT (ELECTROSURGICAL) ×1 IMPLANT
EVACUATOR 1/8 PVC DRAIN (DRAIN) ×2 IMPLANT
EX-PIN ORTHOLOCK NAV 4X150 (PIN) ×4 IMPLANT
GLOVE BIOGEL M STRL SZ7.5 (GLOVE) ×4 IMPLANT
GLOVE BIOGEL PI IND STRL 9 (GLOVE) ×1 IMPLANT
GLOVE BIOGEL PI INDICATOR 9 (GLOVE) ×1
GLOVE INDICATOR 8.0 STRL GRN (GLOVE) ×2 IMPLANT
GLOVE SURG SYN 9.0  PF PI (GLOVE) ×1
GLOVE SURG SYN 9.0 PF PI (GLOVE) ×1 IMPLANT
GOWN STRL REUS W/ TWL LRG LVL3 (GOWN DISPOSABLE) ×2 IMPLANT
GOWN STRL REUS W/TWL 2XL LVL3 (GOWN DISPOSABLE) ×2 IMPLANT
GOWN STRL REUS W/TWL LRG LVL3 (GOWN DISPOSABLE) ×2
HOLDER FOLEY CATH W/STRAP (MISCELLANEOUS) ×2 IMPLANT
HOOD PEEL AWAY FLYTE STAYCOOL (MISCELLANEOUS) ×4 IMPLANT
KIT RM TURNOVER STRD PROC AR (KITS) ×2 IMPLANT
KNIFE SCULPS 14X20 (INSTRUMENTS) ×2 IMPLANT
LABEL OR SOLS (LABEL) ×2 IMPLANT
NDL SAFETY ECLIPSE 18X1.5 (NEEDLE) ×1 IMPLANT
NEEDLE HYPO 18GX1.5 SHARP (NEEDLE) ×1
NEEDLE SPNL 20GX3.5 QUINCKE YW (NEEDLE) ×4 IMPLANT
NS IRRIG 500ML POUR BTL (IV SOLUTION) ×2 IMPLANT
PACK TOTAL KNEE (MISCELLANEOUS) ×2 IMPLANT
PAD WRAPON POLAR KNEE (MISCELLANEOUS) ×1 IMPLANT
PIN FIXATION 1/8DIA X 3INL (PIN) ×2 IMPLANT
PULSAVAC PLUS IRRIG FAN TIP (DISPOSABLE) ×2
SOL .9 NS 3000ML IRR  AL (IV SOLUTION) ×1
SOL .9 NS 3000ML IRR UROMATIC (IV SOLUTION) ×1 IMPLANT
SOL PREP PVP 2OZ (MISCELLANEOUS) ×2
SOLUTION PREP PVP 2OZ (MISCELLANEOUS) ×1 IMPLANT
SPONGE DRAIN TRACH 4X4 STRL 2S (GAUZE/BANDAGES/DRESSINGS) ×2 IMPLANT
STAPLER SKIN PROX 35W (STAPLE) ×2 IMPLANT
STRAP TIBIA SHORT (MISCELLANEOUS) ×2 IMPLANT
SUCTION FRAZIER HANDLE 10FR (MISCELLANEOUS) ×1
SUCTION TUBE FRAZIER 10FR DISP (MISCELLANEOUS) ×1 IMPLANT
SUT VIC AB 0 CT1 36 (SUTURE) ×2 IMPLANT
SUT VIC AB 1 CT1 36 (SUTURE) ×4 IMPLANT
SUT VIC AB 2-0 CT2 27 (SUTURE) ×2 IMPLANT
SYR 20CC LL (SYRINGE) ×2 IMPLANT
SYR 30ML LL (SYRINGE) ×4 IMPLANT
TIP FAN IRRIG PULSAVAC PLUS (DISPOSABLE) ×1 IMPLANT
TOWEL OR 17X26 4PK STRL BLUE (TOWEL DISPOSABLE) ×2 IMPLANT
TOWER CARTRIDGE SMART MIX (DISPOSABLE) ×2 IMPLANT
TRAY FOLEY W/METER SILVER 16FR (SET/KITS/TRAYS/PACK) ×2 IMPLANT
WRAPON POLAR PAD KNEE (MISCELLANEOUS) ×2

## 2017-05-27 NOTE — Anesthesia Procedure Notes (Signed)
Spinal  Patient location during procedure: OR Start time: 05/27/2017 7:30 AM End time: 05/27/2017 7:45 AM Staffing Performed: resident/CRNA  Preanesthetic Checklist Completed: patient identified, site marked, surgical consent, pre-op evaluation, timeout performed, IV checked, risks and benefits discussed and monitors and equipment checked Spinal Block Patient position: sitting Prep: ChloraPrep Patient monitoring: heart rate, continuous pulse ox, blood pressure and cardiac monitor Approach: right paramedian Location: L4-5 Injection technique: single-shot Needle Needle type: Introducer and Quincke  Needle gauge: 22 G Needle length: 12.7 cm Additional Notes Negative paresthesia. Negative blood return. Positive free-flowing CSF. Expiration date of kit checked and confirmed. Patient tolerated procedure well, without complications.

## 2017-05-27 NOTE — Anesthesia Post-op Follow-up Note (Signed)
Anesthesia QCDR form completed.        

## 2017-05-27 NOTE — NC FL2 (Signed)
[place] LEVEL OF CARE SCREENING TOOL     IDENTIFICATION  Patient Name: Kelly Nguyen Birthdate: 16-Oct-1955 Sex: female Admission Date (Current Location): 05/27/2017  [place] and [place] Number:  Engineering geologist and Address:  [facility], [address] [place], [place] [address]      Provider Number: [id number]  Attending Physician Name and Address:  Kelly Leep, MD  Relative Name and Phone Number:       Current Level of Care: Hospital Recommended Level of Care: [place] Prior Approval Number:    Date Approved/Denied:   PASRR Number: [id number])  Discharge Plan: SNF    Current Diagnoses: Patient Active Problem List   Diagnosis Date Noted  . S/P total knee arthroplasty 05/27/2017  . Lumbar radiculopathy 03/28/2016  . S/P hysterectomy 04/10/2015  . Uterine prolapse 04/09/2015  . Closed fracture of ankle 04/09/2015  . Abnormal EKG 04/09/2015  . Pre-op evaluation 04/09/2015  . Arthralgia of multiple joints 10/12/2007  . Colon, diverticulosis 08/28/2003  . Cardiac murmur 08/28/2003  . Diaphragmatic hernia 06/22/2003  . Essential (primary) hypertension 08/17/2002    Orientation RESPIRATION BLADDER Height & Weight     Self, Time, Situation, Place  Normal Continent Weight: 180 lb (81.6 kg) Height:  5' 7.5" (171.5 cm)  BEHAVIORAL SYMPTOMS/MOOD NEUROLOGICAL BOWEL NUTRITION STATUS      Continent Diet(Diet: Clear Liquid to be Advanced. )  AMBULATORY STATUS COMMUNICATION OF NEEDS Skin   Extensive Assist Verbally Surgical wounds(Incision: Right Knee. )                       Personal Care Assistance Level of Assistance  Bathing, Feeding, Dressing Bathing Assistance: Limited assistance Feeding assistance: Independent Dressing Assistance: Limited assistance     Functional Limitations Info  Sight, Hearing, Speech Sight Info: Adequate Hearing Info: Adequate Speech Info: Adequate    SPECIAL CARE  FACTORS FREQUENCY  PT (By licensed PT), OT (By licensed OT)     PT Frequency: (5) OT Frequency: (5)            Contractures      Additional Factors Info  Code Status, Allergies Code Status Info: (Full Code. ) Allergies Info: (Acyclovir And Related, Meloxicam)           Current Medications (05/27/2017):  This is the current hospital active medication list Current Facility-Administered Medications  Medication Dose Route Frequency Provider Last Rate Last Dose  . 0.9 %  sodium chloride infusion   Intravenous Continuous Hooten, Laurice Record, MD 100 mL/hr at 05/27/17 1412    . acetaminophen (OFIRMEV) IV 1,000 mg  1,000 mg Intravenous Q6H Hooten, Laurice Record, MD   Stopped at 05/27/17 1439  . acetaminophen (TYLENOL) tablet 650 mg  650 mg Oral Q4H PRN Hooten, Laurice Record, MD       Or  . acetaminophen (TYLENOL) suppository 650 mg  650 mg Rectal Q4H PRN Hooten, Laurice Record, MD      . Derrill Memo ON 05/28/2017] acidophilus (RISAQUAD) capsule 1 capsule  1 capsule Oral Daily Hooten, Laurice Record, MD      . alum & mag hydroxide-simeth (MAALOX/MYLANTA) 200-200-20 MG/5ML suspension 30 mL  30 mL Oral Q4H PRN Hooten, Laurice Record, MD      . bisacodyl (DULCOLAX) suppository 10 mg  10 mg Rectal Daily PRN Hooten, Laurice Record, MD      . ceFAZolin (ANCEF) 2 g in dextrose 5 % 100 mL IVPB  2 g Intravenous Q6H  Kelly Leep, MD   Stopped at 05/27/17 1503  . celecoxib (CELEBREX) capsule 200 mg  200 mg Oral Q12H Hooten, Laurice Record, MD   200 mg at 05/27/17 1430  . diphenhydrAMINE (BENADRYL) 12.5 MG/5ML elixir 12.5-25 mg  12.5-25 mg Oral Q4H PRN Hooten, Laurice Record, MD      . Derrill Memo ON 05/28/2017] enoxaparin (LOVENOX) injection 30 mg  30 mg Subcutaneous Q12H Hooten, Laurice Record, MD      . ferrous sulfate tablet 325 mg  325 mg Oral BID WC Hooten, Laurice Record, MD      . fluticasone (FLONASE) 50 MCG/ACT nasal spray 1 spray  1 spray Each Nare Daily PRN Hooten, Laurice Record, MD      . lisinopril (PRINIVIL,ZESTRIL) tablet 10 mg  10 mg Oral Daily Hallaji, Sheema M,  RPH       And  . hydrochlorothiazide (MICROZIDE) capsule 12.5 mg  12.5 mg Oral Daily Hallaji, Sheema M, RPH      . magnesium hydroxide (MILK OF MAGNESIA) suspension 30 mL  30 mL Oral Daily PRN Hooten, Laurice Record, MD      . menthol-cetylpyridinium (CEPACOL) lozenge 3 mg  1 lozenge Oral PRN Hooten, Laurice Record, MD       Or  . phenol (CHLORASEPTIC) mouth spray 1 spray  1 spray Mouth/Throat PRN Hooten, Laurice Record, MD      . metoCLOPramide (REGLAN) tablet 10 mg  10 mg Oral TID AC & HS Hooten, Laurice Record, MD      . morphine 2 MG/ML injection 2 mg  2 mg Intravenous Q2H PRN Hooten, Laurice Record, MD      . multivitamin with minerals tablet   Oral Daily Hooten, Laurice Record, MD      . ondansetron (ZOFRAN) tablet 4 mg  4 mg Oral Q6H PRN Hooten, Laurice Record, MD       Or  . ondansetron (ZOFRAN) injection 4 mg  4 mg Intravenous Q6H PRN Hooten, Laurice Record, MD      . oxyCODONE (Oxy IR/ROXICODONE) immediate release tablet 10 mg  10 mg Oral Q3H PRN Hooten, Laurice Record, MD      . oxyCODONE (Oxy IR/ROXICODONE) immediate release tablet 5 mg  5 mg Oral Q3H PRN Hooten, Laurice Record, MD   5 mg at 05/27/17 1516  . pantoprazole (PROTONIX) EC tablet 40 mg  40 mg Oral BID Hooten, Laurice Record, MD      . senna-docusate (Senokot-S) tablet 1 tablet  1 tablet Oral BID Hooten, Laurice Record, MD      . sodium phosphate (FLEET) 7-19 GM/118ML enema 1 enema  1 enema Rectal Once PRN Hooten, Laurice Record, MD      . traMADol Veatrice Bourbon) tablet 50-100 mg  50-100 mg Oral Q4H PRN Hooten, Laurice Record, MD         Discharge Medications: Please see discharge summary for a list of discharge medications.  Relevant Imaging Results:  Relevant Lab Results:   Additional Information (SSN: [id number])  [name], Veronia Beets, LCSW

## 2017-05-27 NOTE — Evaluation (Signed)
Physical Therapy Evaluation Patient Details Name: Kelly Nguyen MRN: [id number] DOB: 06/26/1955 Today's Date: 05/27/2017   History of Present Illness  Pt is a 61 y/o F s/p R TKA.    Clinical Impression  Pt is s/p R TKA resulting in the deficits listed below (see PT Problem List). Kelly Nguyen was able to ambulate 80 ft today with min guard assist and cues for proper management of RW.  She is independent at baseline. R knee ROM 0-63 deg. Pt will benefit from skilled PT to increase their independence and safety with mobility to allow discharge to the venue listed below.     Follow Up Recommendations Home health PT    Equipment Recommendations  Rolling walker with 5" wheels    Recommendations for Other Services       Precautions / Restrictions Precautions Precautions: Fall;Knee Precaution Booklet Issued: No Precaution Comments: Instructed pt in no pillow under knee Required Braces or Orthoses: Knee Immobilizer - Right Knee Immobilizer - Right: (if unable to perform SLR) Restrictions Weight Bearing Restrictions: Yes RLE Weight Bearing: Weight bearing as tolerated      Mobility  Bed Mobility Overal bed mobility: Needs Assistance Bed Mobility: Supine to Sit     Supine to sit: Supervision;HOB elevated     General bed mobility comments: Supervision for safety.  Pt uses bed rail to assist.    Transfers Overall transfer level: Needs assistance Equipment used: Rolling walker (2 wheeled) Transfers: Sit to/from Stand Sit to Stand: Min guard         General transfer comment: Cues for proper hand placement and technique.  Pt with well controlled descent to sit.    Ambulation/Gait Ambulation/Gait assistance: Min guard Ambulation Distance (Feet): 80 Feet Assistive device: Rolling walker (2 wheeled) Gait Pattern/deviations: Step-to pattern;Decreased stance time - right;Decreased step length - left;Decreased weight shift to right;Antalgic Gait velocity: decreased Gait velocity  interpretation: Below normal speed for age/gender General Gait Details: Cues for proper RW management as pt stepping too close to front bar on RW.  Cues to allow R knee to flex to decrease compensatory R hip hike.  Pt with slow but steady gait.   Stairs            Wheelchair Mobility    Modified Rankin (Stroke Patients Only)       Balance Overall balance assessment: Needs assistance Sitting-balance support: No upper extremity supported;Feet supported Sitting balance-Leahy Scale: Good     Standing balance support: No upper extremity supported;During functional activity Standing balance-Leahy Scale: Fair Standing balance comment: Pt able to stand statically without UE support but relies on UE support for dynamic activities                             Pertinent Vitals/Pain Pain Assessment: Faces Faces Pain Scale: Hurts little more Pain Location: R knee Pain Descriptors / Indicators: Aching;Grimacing;Guarding Pain Intervention(s): Limited activity within patient's tolerance;Premedicated before session;Monitored during session;Repositioned;Ice applied    Home Living Family/patient expects to be discharged to:: Private residence Living Arrangements: Spouse/significant other Available Help at Discharge: Family;Available 24 hours/day Type of Home: House Home Access: Stairs to enter Entrance Stairs-Rails: Chemical engineer of Steps: 4 Home Layout: Two level;Able to live on main level with bedroom/bathroom Home Equipment: Kasandra Knudsen - single point;Shower seat - built in      Prior Function Level of Independence: Independent         Comments: Pt ambulates without AD.  No  falls in the past 6 months.  Enjoy gardening and has a Programmer, multimedia garden.  Enjoys walking.  Retired ~1 year ago.      Hand Dominance        Extremity/Trunk Assessment   Upper Extremity Assessment Upper Extremity Assessment: Overall WFL for tasks assessed    Lower Extremity  Assessment Lower Extremity Assessment: RLE deficits/detail RLE Deficits / Details: Unable to formally assess but functionally pt able to perform SLR    Cervical / Trunk Assessment Cervical / Trunk Assessment: Normal  Communication   Communication: No difficulties  Cognition Arousal/Alertness: Awake/alert Behavior During Therapy: WFL for tasks assessed/performed Overall Cognitive Status: Within Functional Limits for tasks assessed                                        General Comments      Exercises Total Joint Exercises Ankle Circles/Pumps: AROM;Both;10 reps;Supine Quad Sets: Strengthening;Both;10 reps;Supine Straight Leg Raises: Strengthening;Right;10 reps;Supine Long Arc Quad: Strengthening;Right;10 reps;Seated Knee Flexion: AAROM;Right;5 reps;Seated;Other (comment)(with 5 second holds) Goniometric ROM: 0-63 deg   Assessment/Plan    PT Assessment Patient needs continued PT services  PT Problem List Decreased strength;Decreased range of motion;Decreased activity tolerance;Decreased balance;Decreased mobility;Decreased knowledge of use of DME;Decreased safety awareness;Decreased knowledge of precautions;Pain       PT Treatment Interventions DME instruction;Gait training;Stair training;Functional mobility training;Therapeutic activities;Therapeutic exercise;Balance training;Neuromuscular re-education;Patient/family education;Modalities;Manual techniques    PT Goals (Current goals can be found in the Care Plan section)  Acute Rehab PT Goals Patient Stated Goal: to go home PT Goal Formulation: With patient Time For Goal Achievement: 06/10/17 Potential to Achieve Goals: Good    Frequency BID   Barriers to discharge        Co-evaluation               AM-PAC PT "6 Clicks" Daily Activity  Outcome Measure Difficulty turning over in bed (including adjusting bedclothes, sheets and blankets)?: Unable Difficulty moving from lying on back to sitting on  the side of the bed? : Unable Difficulty sitting down on and standing up from a chair with arms (e.g., wheelchair, bedside commode, etc,.)?: A Little Help needed moving to and from a bed to chair (including a wheelchair)?: A Little Help needed walking in hospital room?: A Little Help needed climbing 3-5 steps with a railing? : A Little 6 Click Score: 14    End of Session Equipment Utilized During Treatment: Gait belt Activity Tolerance: Patient tolerated treatment well Patient left: in chair;with call bell/phone within reach;with chair alarm set;Other (comment)(polar care and bone foam in place) Nurse Communication: Mobility status;Other (comment)(handle on polar care broken) PT Visit Diagnosis: Pain;Muscle weakness (generalized) (M62.81);Unsteadiness on feet (R26.81);Other abnormalities of gait and mobility (R26.89) Pain - Right/Left: Right Pain - part of body: Knee    Time: 9983-3825 PT Time Calculation (min) (ACUTE ONLY): 26 min   Charges:   PT Evaluation $PT Eval Low Complexity: 1 Low PT Treatments $Therapeutic Exercise: 8-22 mins   PT G Codes:   PT G-Codes **NOT FOR INPATIENT CLASS** Functional Assessment Tool Used: AM-PAC 6 Clicks Basic Mobility;Clinical judgement Functional Limitation: Mobility: Walking and moving around Mobility: Walking and Moving Around Current Status (K5397): At least 40 percent but less than 60 percent impaired, limited or restricted Mobility: Walking and Moving Around Goal Status [phone]): At least 1 percent but less than 20 percent impaired, limited or restricted  Collie Siad PT, DPT 05/27/2017, 4:13 PM

## 2017-05-27 NOTE — Op Note (Addendum)
OPERATIVE NOTE  DATE OF SURGERY:  05/27/2017  PATIENT NAME:  Kelly Nguyen   DOB: 1955/08/14  MRN: [id number]  PRE-OPERATIVE DIAGNOSIS: Degenerative arthrosis of the right knee, primary  POST-OPERATIVE DIAGNOSIS:  Same  PROCEDURE:  Right total knee arthroplasty using computer-assisted navigation  SURGEON:  Marciano Sequin. M.D.  ASSISTANT: Jacqulyn Ducking, RNFA (present and scrubbed throughout the case, critical for assistance with exposure, retraction, instrumentation, and closure)  ANESTHESIA: spinal  ESTIMATED BLOOD LOSS: 50 mL  FLUIDS REPLACED: 1350 mL of crystalloid  TOURNIQUET TIME: 107 minutes  DRAINS: 2 medium Hemovac drains  SOFT TISSUE RELEASES: Anterior cruciate ligament, posterior cruciate ligament, deep medial collateral ligament, patellofemoral ligament, posterolateral corner  IMPLANTS UTILIZED: DePuy Attune size 5 posterior stabilized femoral component (cemented), size 6 rotating platform tibial component (cemented), 41 mm medialized dome patella (cemented), and a 5 mm stabilized rotating platform polyethylene insert.  INDICATIONS FOR SURGERY: Kelly Nguyen is a 61 y.o. year old female with a long history of progressive knee pain. X-rays demonstrated severe degenerative changes in tricompartmental fashion. The patient had not seen any significant improvement despite conservative nonsurgical intervention. After discussion of the risks and benefits of surgical intervention, the patient expressed understanding of the risks benefits and agree with plans for total knee arthroplasty.   The risks, benefits, and alternatives were discussed at length including but not limited to the risks of infection, bleeding, nerve injury, stiffness, blood clots, the need for revision surgery, cardiopulmonary complications, among others, and they were willing to proceed.  PROCEDURE IN DETAIL: The patient was brought into the operating room and, after adequate spinal anesthesia was achieved, a  tourniquet was placed on the patient's upper thigh. The patient's knee and leg were cleaned and prepped with alcohol and DuraPrep and draped in the usual sterile fashion. A "timeout" was performed as per usual protocol. The lower extremity was exsanguinated using an Esmarch, and the tourniquet was inflated to 300 mmHg. An anterior longitudinal incision was made followed by a standard mid vastus approach. The deep fibers of the medial collateral ligament were elevated in a subperiosteal fashion off of the medial flare of the tibia so as to maintain a continuous soft tissue sleeve. The patella was subluxed laterally and the patellofemoral ligament was incised. Inspection of the knee demonstrated severe degenerative changes with full-thickness loss of articular cartilage. Osteophytes were debrided using a rongeur. Anterior and posterior cruciate ligaments were excised. Two 4.0 mm Schanz pins were inserted in the femur and into the tibia for attachment of the array of trackers used for computer-assisted navigation. Hip center was identified using a circumduction technique. Distal landmarks were mapped using the computer. The distal femur and proximal tibia were mapped using the computer. The distal femoral cutting guide was positioned using computer-assisted navigation so as to achieve a 5 distal valgus cut. The femur was sized and it was felt that a size 5 femoral component was appropriate. A size 5 femoral cutting guide was positioned and the anterior cut was performed and verified using the computer. This was followed by completion of the posterior and chamfer cuts. Femoral cutting guide for the central box was then positioned in the center box cut was performed.  Attention was then directed to the proximal tibia. Medial and lateral menisci were excised. The extramedullary tibial cutting guide was positioned using computer-assisted navigation so as to achieve a 0 varus-valgus alignment and 3 posterior slope. The  cut was performed and verified using the computer. The proximal tibia was  sized and it was felt that a size 6 tibial tray was appropriate. Tibial and femoral trials were inserted followed by insertion of a 5 mm polyethylene insert.  The knee was felt to be tight laterally.  The trial components were removed and the knee was placed in extension and distracted using the Moreland retractors.  The posterolateral corner was carefully released using combination of electrocautery and Metzenbaum scissors.  The trial components were reinserted.  This allowed for excellent mediolateral soft tissue balancing both in flexion and in full extension. Finally, the patella was cut and prepared so as to accommodate a 41 mm medialized dome patella. A patella trial was placed and the knee was placed through a range of motion with excellent patellar tracking appreciated. The femoral trial was removed after debridement of posterior osteophytes. The central post-hole for the tibial component was reamed followed by insertion of a keel punch. Tibial trials were then removed. Cut surfaces of bone were irrigated with copious amounts of normal saline with antibiotic solution using pulsatile lavage and then suctioned dry. Polymethylmethacrylate cement was prepared in the usual fashion using a vacuum mixer. Cement was applied to the cut surface of the proximal tibia as well as along the undersurface of a size 6 rotating platform tibial component. Tibial component was positioned and impacted into place. Excess cement was removed using Civil Service fast streamer. Cement was then applied to the cut surfaces of the femur as well as along the posterior flanges of the size 5 femoral component. The femoral component was positioned and impacted into place. Excess cement was removed using Civil Service fast streamer. A 5 mm polyethylene trial was inserted and the knee was brought into full extension with steady axial compression applied. Finally, cement was applied to the  backside of a 41 mm medialized dome patella and the patellar component was positioned and patellar clamp applied. Excess cement was removed using Civil Service fast streamer. After adequate curing of the cement, the tourniquet was deflated after a total tourniquet time of 107 minutes. Hemostasis was achieved using electrocautery. The knee was irrigated with copious amounts of normal saline with antibiotic solution using pulsatile lavage and then suctioned dry. 20 mL of 1.3% Exparel and 60 mL of 0.25% Marcaine in 40 mL of normal saline was injected along the posterior capsule, medial and lateral gutters, and along the arthrotomy site. A 5 mm stabilized rotating platform polyethylene insert was inserted and the knee was placed through a range of motion with excellent mediolateral soft tissue balancing appreciated and excellent patellar tracking noted. 2 medium drains were placed in the wound bed and brought out through separate stab incisions. The medial parapatellar portion of the incision was reapproximated using interrupted sutures of #1 Vicryl. Subcutaneous tissue was approximated in layers using first #0 Vicryl followed #2-0 Vicryl. The skin was approximated with skin staples. A sterile dressing was applied.  The patient tolerated the procedure well and was transported to the recovery room in stable condition.    James P. Holley Bouche., M.D.

## 2017-05-27 NOTE — Discharge Instructions (Signed)
°  Instructions after Total Knee Replacement ° ° Manila Rommel P. Zoila Ditullio, Jr., M.D.    ° Dept. of Orthopaedics & Sports Medicine ° [facility] ° [address] ° [place], [place]  [address] ° Phone: [phone]   Fax: [phone] ° °  °DIET: °• Drink plenty of non-alcoholic fluids. °• Resume your normal diet. Include foods high in fiber. ° °ACTIVITY:  °• You may use crutches or a walker with weight-bearing as tolerated, unless instructed otherwise. °• You may be weaned off of the walker or crutches by your Physical Therapist.  °• Do NOT place pillows under the knee. Anything placed under the knee could limit your ability to straighten the knee.   °• Continue doing gentle exercises. Exercising will reduce the pain and swelling, increase motion, and prevent muscle weakness.   °• Please continue to use the TED compression stockings for 6 weeks. You may remove the stockings at night, but should reapply them in the morning. °• Do not drive or operate any equipment until instructed. ° °WOUND CARE:  °• Continue to use the PolarCare or ice packs periodically to reduce pain and swelling. °• You may bathe or shower after the staples are removed at the first office visit following surgery. ° °MEDICATIONS: °• You may resume your regular medications. °• Please take the pain medication as prescribed on the medication. °• Do not take pain medication on an empty stomach. °• You have been given a prescription for a blood thinner (Lovenox or Coumadin). Please take the medication as instructed. (NOTE: After completing a 2 week course of Lovenox, take one Enteric-coated aspirin once a day. This along with elevation will help reduce the possibility of phlebitis in your operated leg.) °• Do not drive or drink alcoholic beverages when taking pain medications. ° °CALL THE OFFICE FOR: °• Temperature above 101 degrees °• Excessive bleeding or drainage on the dressing. °• Excessive swelling, coldness, or paleness of the toes. °• Persistent  nausea and vomiting. ° °FOLLOW-UP:  °• You should have an appointment to return to the office in 10-14 days after surgery. °• Arrangements have been made for continuation of Physical Therapy (either home therapy or outpatient therapy). °  °

## 2017-05-27 NOTE — Transfer of Care (Signed)
Immediate Anesthesia Transfer of Care Note  Patient: Kelly Nguyen  Procedure(s) Performed: COMPUTER ASSISTED TOTAL KNEE ARTHROPLASTY (Right Knee)  Patient Location: PACU  Anesthesia Type:Spinal  Level of Consciousness: sedated  Airway & Oxygen Therapy: Patient Spontanous Breathing and Patient connected to nasal cannula oxygen  Post-op Assessment: Report given to RN and Post -op Vital signs reviewed and stable  Post vital signs: Reviewed and stable  Last Vitals:  Vitals:   05/27/17 0638  BP: 134/73  Pulse: 68  Resp: 17  Temp: (!) 36.2 C  SpO2: 99%    Last Pain:  Vitals:   05/27/17 0638  TempSrc: Oral  PainSc: 6       Patients Stated Pain Goal: 2 (23/36/12 2449)  Complications: No apparent anesthesia complications

## 2017-05-27 NOTE — H&P (Signed)
The patient has been re-examined, and the chart reviewed, and there have been no interval changes to the documented history and physical.    The risks, benefits, and alternatives have been discussed at length. The patient expressed understanding of the risks benefits and agreed with plans for surgical intervention.  James P. Hooten, Jr. M.D.    

## 2017-05-27 NOTE — Anesthesia Preprocedure Evaluation (Signed)
Anesthesia Evaluation  Patient identified by MRN, date of birth, ID band Patient awake    Reviewed: Allergy & Precautions, H&P , NPO status , Patient's Chart, lab work & pertinent test results  History of Anesthesia Complications Negative for: history of anesthetic complications  Airway Mallampati: III  TM Distance: <3 FB Neck ROM: limited    Dental  (+) Chipped   Pulmonary neg pulmonary ROS, neg shortness of breath,           Cardiovascular Exercise Tolerance: Good hypertension, (-) angina(-) DOE + dysrhythmias + Valvular Problems/Murmurs      Neuro/Psych  Neuromuscular disease negative psych ROS   GI/Hepatic Neg liver ROS, hiatal hernia, GERD  Medicated and Controlled,  Endo/Other  negative endocrine ROS  Renal/GU      Musculoskeletal   Abdominal   Peds  Hematology negative hematology ROS (+)   Anesthesia Other Findings Past Medical History: No date: Dysrhythmia No date: GERD (gastroesophageal reflux disease) No date: Heart valve problem No date: History of hiatal hernia No date: Hypertension 2012: Prolapse of uterus 2012: Stress incontinence  Past Surgical History: No date: ABDOMINAL HYSTERECTOMY No date: CARDIAC CATHETERIZATION No date: CHOLECYSTECTOMY 02/27/2017: COLONOSCOPY WITH PROPOFOL; N/A     Comment:  Procedure: COLONOSCOPY WITH PROPOFOL;  Surgeon: Jonathon Bellows, MD;  Location: [facility] ENDOSCOPY;  Service:               Gastroenterology;  Laterality: N/A; 04/10/2015: CYSTOCELE REPAIR; N/A     Comment:  Procedure: ANTERIOR REPAIR (CYSTOCELE);  Surgeon: Gae Dry, MD;  Location: [facility] ORS;  Service: Gynecology;               Laterality: N/A; 2004: HALLUX VALGUS CORRECTION; Right No date: HAMMER TOE SURGERY 2004: ROTATOR CUFF REPAIR; Right 04/10/2015: SALPINGOOPHORECTOMY; Bilateral     Comment:  Procedure: SALPINGO OOPHORECTOMY;  Surgeon: Gae Dry,  MD;  Location: [facility] ORS;  Service: Gynecology;                Laterality: Bilateral;  vaginal 04/10/2015: VAGINAL HYSTERECTOMY     Comment:  Procedure: HYSTERECTOMY VAGINAL, ;  Surgeon: Gae Dry, MD;  Location: [facility] ORS;  Service: Gynecology;;     Reproductive/Obstetrics negative OB ROS                             Anesthesia Physical Anesthesia Plan  ASA: III  Anesthesia Plan: Spinal   Post-op Pain Management:    Induction:   PONV Risk Score and Plan: Propofol infusion  Airway Management Planned: Natural Airway and Nasal Cannula  Additional Equipment:   Intra-op Plan:   Post-operative Plan:   Informed Consent: I have reviewed the patients History and Physical, chart, labs and discussed the procedure including the risks, benefits and alternatives for the proposed anesthesia with the patient or authorized representative who has indicated his/her understanding and acceptance.   Dental Advisory Given  Plan Discussed with: Anesthesiologist, CRNA and Surgeon  Anesthesia Plan Comments: (Patient reports no bleeding problems and no anticoagulant use.  Plan for spinal with backup GA  Patient consented for risks of anesthesia including but not limited to:  -  adverse reactions to medications - risk of bleeding, infection, nerve damage and headache - risk of failed spinal - damage to teeth, lips or other oral mucosa - sore throat or hoarseness - Damage to heart, brain, lungs or loss of life  Patient voiced understanding.)        Anesthesia Quick Evaluation

## 2017-05-28 ENCOUNTER — Encounter: Payer: Self-pay | Admitting: Orthopedic Surgery

## 2017-05-28 MED ORDER — ENOXAPARIN SODIUM 40 MG/0.4ML ~~LOC~~ SOLN: 40.0000 mg | SUBCUTANEOUS | 0 refills | Status: DC

## 2017-05-28 MED ORDER — OXYCODONE HCL 5 MG PO TABS: 5.0000 mg | ORAL_TABLET | ORAL | 0 refills | Status: DC | PRN

## 2017-05-28 NOTE — Progress Notes (Signed)
Physical Therapy Treatment Patient Details Name: Kelly Nguyen MRN: [id number] DOB: 1955-11-02 Today's Date: 05/28/2017    History of Present Illness Pt. is a 61 y.o. female who was admitted to [facility] for a Right TKR.    PT Comments    Pt continues to make progress with all functional mobility. Pt demonstrated min guard to supervision levels with transfers and use of RW. Pt increased ambulation distance this pm session with improvement of gait quality as was able to increase knee flexion with gait. Pt tolerated increased seated therex as noted below and was provided HEP as pending d/c to home tomorrow. Pt would continue to benefit from skilled PT to improve strength and ROM as to improve functional mobility tolerance.    Follow Up Recommendations  Home health PT     Equipment Recommendations  Rolling walker with 5" wheels    Recommendations for Other Services       Precautions / Restrictions Precautions Precautions: Fall;Knee Restrictions Weight Bearing Restrictions: Yes RLE Weight Bearing: Weight bearing as tolerated    Mobility  Bed Mobility                  Transfers Overall transfer level: Needs assistance Equipment used: Rolling walker (2 wheeled) Transfers: Sit to/from Stand Sit to Stand: Min guard         General transfer comment: Pt demonstrated good safey with transfers  Ambulation/Gait Ambulation/Gait assistance: Supervision;Min guard Ambulation Distance (Feet): 300 Feet Assistive device: Rolling walker (2 wheeled) Gait Pattern/deviations: Step-through pattern;Decreased stance time - right Gait velocity: decreased Gait velocity interpretation: Below normal speed for age/gender General Gait Details: pt demonstrating improving cadence and knee flexion with activity   Stairs            Wheelchair Mobility    Modified Rankin (Stroke Patients Only)       Balance Overall balance assessment: Needs assistance Sitting-balance support: No upper  extremity supported Sitting balance-Leahy Scale: Good     Standing balance support: No upper extremity supported;During functional activity Standing balance-Leahy Scale: Fair Standing balance comment: Pt maintained fair balance while washing hands after toileting                            Cognition Arousal/Alertness: Awake/alert Behavior During Therapy: WFL for tasks assessed/performed Overall Cognitive Status: Within Functional Limits for tasks assessed                                        Exercises Total Joint Exercises Ankle Circles/Pumps: AROM;Both;15 reps Quad Sets: Right;10 reps(3 sec hold) Short Arc Quad: AAROM;Strengthening;Right;15 reps;Seated Heel Slides: AAROM;Strengthening;Right;10 reps;Seated(long sit) Hip ABduction/ADduction: Strengthening;Right;15 reps;Seated Straight Leg Raises: Strengthening;Right;10 reps;Seated Long Arc Quad: AAROM;Strengthening;Right;15 reps;Seated    General Comments        Pertinent Vitals/Pain Pain Assessment: 0-10 Pain Score: 4  Pain Location: R knee Pain Descriptors / Indicators: Aching;Operative site guarding Pain Intervention(s): Monitored during session    Home Living                      Prior Function            PT Goals (current goals can now be found in the care plan section) Acute Rehab PT Goals Patient Stated Goal: To return home PT Goal Formulation: With patient Time For Goal Achievement: 06/10/17 Potential to Achieve Goals:  Good Progress towards PT goals: Progressing toward goals    Frequency    BID      PT Plan      Co-evaluation              AM-PAC PT "6 Clicks" Daily Activity  Outcome Measure  Difficulty turning over in bed (including adjusting bedclothes, sheets and blankets)?: A Little Difficulty moving from lying on back to sitting on the side of the bed? : A Little Difficulty sitting down on and standing up from a chair with arms (e.g.,  wheelchair, bedside commode, etc,.)?: A Little Help needed moving to and from a bed to chair (including a wheelchair)?: A Little Help needed walking in hospital room?: A Little Help needed climbing 3-5 steps with a railing? : A Little 6 Click Score: 18    End of Session Equipment Utilized During Treatment: Gait belt Activity Tolerance: Patient tolerated treatment well Patient left: in chair;with call bell/phone within reach;with chair alarm set;with family/visitor present   PT Visit Diagnosis: Pain;Muscle weakness (generalized) (M62.81);Unsteadiness on feet (R26.81);Other abnormalities of gait and mobility (R26.89) Pain - Right/Left: Right Pain - part of body: Knee     Time: 1345-1412 PT Time Calculation (min) (ACUTE ONLY): 27 min  Charges:  $Gait Training: 8-22 mins $Therapeutic Exercise: 8-22 mins             Venus Gilles  Zaydee Aina, PTA 05/28/2017, 2:21 PM

## 2017-05-28 NOTE — Evaluation (Signed)
Occupational Therapy Evaluation Patient Details Name: Kelly Nguyen MRN: [id number] DOB: 02/20/1956 Today's Date: 05/28/2017    History of Present Illness Pt. is a 61 y.o. female who was admitted to [facility] for a Right TKR.   Clinical Impression   Pt. is a 61 y.o. female who was admitted to [facility] for a right TKR. Pt. resides at home with her husband. Pt. Resides In a 2 storey home with bedroom, and bathroom on the 1st floor.. Pt. Was independent with ADLs, and IADLs prior to admission. Pt. Has retired after working for [employer] for 43 years. Pt. Education was provided about A/E use for LE ADLs. Pt. Reports no questions or concerns about OT services, home management, or A/E use. Pt. Anticipates that she will not need any A/E for ADLs, and declined to practice with them. Pt. Plans to have her husband assist her as needed upon discharge. Pt. To return home upon discharge with her husband. No further OT services are warranted at this time. Pt. Is in agreement.    Follow Up Recommendations  No OT follow up    Equipment Recommendations  3 in 1 bedside commode    Recommendations for Other Services       Precautions / Restrictions Precautions Precautions: Fall;Knee Required Braces or Orthoses: Knee Immobilizer - Right Restrictions Weight Bearing Restrictions: Yes RLE Weight Bearing: Weight bearing as tolerated                                                   ADL either performed or assessed with clinical judgement   ADL Overall ADL's : Needs assistance/impaired Eating/Feeding: Set up   Grooming: Set up   Upper Body Bathing: Set up   Lower Body Bathing: Minimal assistance   Upper Body Dressing : Set up   Lower Body Dressing: Minimal assistance               Functional mobility during ADLs: Min guard General ADL Comments: Pt. education was provided about A/E use for LE ADLs, and home set-up.     Vision Patient Visual Report: No change from baseline        Perception     Praxis      Pertinent Vitals/Pain Pain Assessment: No/denies pain     Hand Dominance Right   Extremity/Trunk Assessment Upper Extremity Assessment Upper Extremity Assessment: Overall WFL for tasks assessed           Communication Communication Communication: No difficulties   Cognition Arousal/Alertness: Awake/alert Behavior During Therapy: WFL for tasks assessed/performed Overall Cognitive Status: Within Functional Limits for tasks assessed                                     General Comments       Exercises     Shoulder Instructions      Home Living Family/patient expects to be discharged to:: Private residence Living Arrangements: Spouse/significant other Available Help at Discharge: Available 24 hours/day Type of Home: House Home Access: Stairs to enter [employer] of Steps: 4 Entrance Stairs-Rails: Right;Left Home Layout: Two level;Able to live on main level with bedroom/bathroom Alternate Level Stairs-Number of Steps: flight   Bathroom Shower/Tub: Walk-in shower;Door   Bathroom Toilet: Handicapped height     Home Equipment: [employer] -  single point;Shower seat - built in          Prior Functioning/Environment Level of Independence: Independent        Comments: Pt was independent with all ADLs, IADLs, medication management. Pt. has been retried approximately 2 years after working for [employer] for 43 years.        OT Problem List: Decreased strength;Decreased activity tolerance;Decreased range of motion;Decreased knowledge of use of DME or AE;Pain      OT Treatment/Interventions: Self-care/ADL training;Therapeutic exercise;Patient/family education;Therapeutic activities;DME and/or AE instruction    OT Goals(Current goals can be found in the care plan section) Acute Rehab OT Goals Patient Stated Goal: To return home OT Goal Formulation: With patient Potential to Achieve Goals: Good  OT Frequency:      Barriers to D/C:            Co-evaluation              AM-PAC PT "6 Clicks" Daily Activity     Outcome Measure Help from another person eating meals?: None Help from another person taking care of personal grooming?: None Help from another person toileting, which includes using toliet, bedpan, or urinal?: None Help from another person bathing (including washing, rinsing, drying)?: A Little Help from another person to put on and taking off regular upper body clothing?: None Help from another person to put on and taking off regular lower body clothing?: A Little 6 Click Score: 22   End of Session    Activity Tolerance: Patient tolerated treatment well Patient left: in bed  OT Visit Diagnosis: Muscle weakness (generalized) (M62.81)                Time: 9038-3338 OT Time Calculation (min): 15 min Charges:  OT General Charges $OT Visit: 1 Visit OT Evaluation $OT Eval Low Complexity: 1 Low G-Codes: OT G-codes **NOT FOR INPATIENT CLASS** Functional Limitation: Self care Self Care Current Status (V2919): At least 1 percent but less than 20 percent impaired, limited or restricted Self Care Goal Status (T6606): 0 percent impaired, limited or restricted   Harrel Carina, MS, OTR/L   Harrel Carina, MS, OTR/L 05/28/2017, 9:46 AM

## 2017-05-28 NOTE — Plan of Care (Signed)
  Progressing Education: Knowledge of General Education information will improve 05/28/2017 1933 - Progressing by Milderd Meager, RN Health Behavior/Discharge Planning: Ability to manage health-related needs will improve 05/28/2017 1933 - Progressing by Milderd Meager, RN Clinical Measurements: Ability to maintain clinical measurements within normal limits will improve 05/28/2017 1933 - Progressing by Milderd Meager, RN Will remain free from infection 05/28/2017 1933 - Progressing by Milderd Meager, RN Diagnostic test results will improve 05/28/2017 1933 - Progressing by Milderd Meager, RN Respiratory complications will improve 05/28/2017 1933 - Progressing by Milderd Meager, RN Cardiovascular complication will be avoided 05/28/2017 1933 - Progressing by Milderd Meager, RN Activity: Risk for activity intolerance will decrease 05/28/2017 1933 - Progressing by Milderd Meager, RN Nutrition: Adequate nutrition will be maintained 05/28/2017 1933 - Progressing by Milderd Meager, RN Coping: Level of anxiety will decrease 05/28/2017 1933 - Progressing by Milderd Meager, RN Elimination: Will not experience complications related to bowel motility 05/28/2017 1933 - Progressing by Milderd Meager, RN Will not experience complications related to urinary retention 05/28/2017 1933 - Progressing by Milderd Meager, RN Pain Managment: General experience of comfort will improve 05/28/2017 1933 - Progressing by Milderd Meager, RN Safety: Ability to remain free from injury will improve 05/28/2017 1933 - Progressing by Milderd Meager, RN Skin Integrity: Risk for impaired skin integrity will decrease 05/28/2017 1933 - Progressing by Milderd Meager, RN Education: Knowledge of the prescribed therapeutic regimen will improve 05/28/2017 1933 - Progressing by Milderd Meager, RN Activity: Ability to avoid complications of mobility  impairment will improve 05/28/2017 1933 - Progressing by Milderd Meager, RN Range of joint motion will improve 05/28/2017 1933 - Progressing by Milderd Meager, RN Clinical Measurements: Postoperative complications will be avoided or minimized 05/28/2017 1933 - Progressing by Milderd Meager, RN Pain Management: Pain level will decrease with appropriate interventions 05/28/2017 1933 - Progressing by Milderd Meager, RN Skin Integrity: Signs of wound healing will improve 05/28/2017 1933 - Progressing by Milderd Meager, RN

## 2017-05-28 NOTE — Progress Notes (Signed)
Physical Therapy Treatment Patient Details Name: Kelly Nguyen MRN: [id number] DOB: 1955/08/30 Today's Date: 05/28/2017    History of Present Illness Pt. is a 61 y.o. female who was admitted to [facility] for a Right TKR.    PT Comments    Pt presented in bed agreeable to therapy. Performed supine to sit min guard with use of features and was able to ambulate to toilet and safely perform toilet transfer from elevated toilet seat. Pt demonstrated improved tolerance with ambulation with decreased compensatory hip hiking and increased ambulation distance. Pt able to participate in seated therex after gait as noted below and demonstrated improved ROM. She would continue to benefit from skilled PT to improve ROM, gait, and RLE strength to return to PLOF.   Follow Up Recommendations  Home health PT     Equipment Recommendations  Rolling walker with 5" wheels    Recommendations for Other Services       Precautions / Restrictions Precautions Precautions: Fall;Knee Required Braces or Orthoses: Knee Immobilizer - Right Restrictions Weight Bearing Restrictions: Yes RLE Weight Bearing: Weight bearing as tolerated    Mobility  Bed Mobility Overal bed mobility: Needs Assistance Bed Mobility: Supine to Sit     Supine to sit: Supervision     General bed mobility comments: use of features noted  Transfers Overall transfer level: Needs assistance Equipment used: Rolling walker (2 wheeled) Transfers: Sit to/from Stand Sit to Stand: Min guard         General transfer comment: cues for safety with RW and placement of RLE, from bed and elevated toilet  Ambulation/Gait Ambulation/Gait assistance: Min guard Ambulation Distance (Feet): 150 Feet Assistive device: Rolling walker (2 wheeled) Gait Pattern/deviations: Step-to pattern;Step-through pattern;Decreased step length - right;Decreased stance time - right Gait velocity: decreased Gait velocity interpretation: Below normal speed for  age/gender General Gait Details: min cues for increasing R knee flexion with gait   Stairs Stairs: Yes   Stair Management: One rail Right;Sideways Number of Stairs: 4 General stair comments: Pt unable to reach B rails, used singe R rail, demonstrating good safety  Wheelchair Mobility    Modified Rankin (Stroke Patients Only)       Balance Overall balance assessment: Needs assistance Sitting-balance support: No upper extremity supported Sitting balance-Leahy Scale: Good     Standing balance support: No upper extremity supported Standing balance-Leahy Scale: Fair                              Cognition Arousal/Alertness: Awake/alert Behavior During Therapy: WFL for tasks assessed/performed Overall Cognitive Status: Within Functional Limits for tasks assessed                                        Exercises Total Joint Exercises Ankle Circles/Pumps: AROM;Both;15 reps Quad Sets: Strengthening;Both;15 reps;Supine Short Arc Quad: AAROM;Strengthening;Right;15 reps;Seated Hip ABduction/ADduction: Strengthening;Right;15 reps;Seated Straight Leg Raises: Strengthening;Right;10 reps;Seated(long sit in recliner) Long Arc Quad: AAROM;Strengthening;Right;10 reps;Seated Knee Flexion: AAROM;Right;5 reps Goniometric ROM: 3-85    General Comments        Pertinent Vitals/Pain Pain Assessment: 0-10 Pain Score: 4  Pain Location: R knee Pain Descriptors / Indicators: Aching;Discomfort;Operative site guarding Pain Intervention(s): Limited activity within patient's tolerance;Monitored during session    [place] expects to be discharged to:: Private residence Living Arrangements: Spouse/significant other Available Help at Discharge: Available 24 hours/day Type  of Home: House Home Access: Stairs to enter Entrance Stairs-Rails: Right;Left Home Layout: Two level;Able to live on main level with bedroom/bathroom Home Equipment: Kelly [employer] -  single point;Shower seat - built in      Prior Function Level of Independence: Independent      Comments: Pt was independent with all ADLs, IADLs, medication management. Pt. has been retried approximately 2 years after working for [employer] for 43 years.   PT Goals (current goals can now be found in the care plan section) Acute Rehab PT Goals Patient Stated Goal: To return home PT Goal Formulation: With patient Time For Goal Achievement: 06/10/17 Potential to Achieve Goals: Good    Frequency    BID      PT Plan      Co-evaluation              AM-PAC PT "6 Clicks" Daily Activity  Outcome Measure  Difficulty turning over in bed (including adjusting bedclothes, sheets and blankets)?: A Little Difficulty moving from lying on back to sitting on the side of the bed? : A Little Difficulty sitting down on and standing up from a chair with arms (e.g., wheelchair, bedside commode, etc,.)?: A Little Help needed moving to and from a bed to chair (including a wheelchair)?: A Little Help needed walking in hospital room?: A Little Help needed climbing 3-5 steps with a railing? : A Little 6 Click Score: 18    End of Session Equipment Utilized During Treatment: Gait belt Activity Tolerance: Patient tolerated treatment well Patient left: in chair;with call bell/phone within reach;with family/visitor present   PT Visit Diagnosis: Pain;Muscle weakness (generalized) (M62.81);Unsteadiness on feet (R26.81);Other abnormalities of gait and mobility (R26.89) Pain - Right/Left: Right Pain - part of body: Knee     Time: 3154-0086 PT Time Calculation (min) (ACUTE ONLY): 30 min  Charges:  $Therapeutic Exercise: 8-22 mins $Therapeutic Activity: 8-22 mins                      Kelly Nguyen  Kelly Nguyen, PTA 05/28/2017, 10:35 AM

## 2017-05-28 NOTE — Progress Notes (Signed)
Clinical Social Worker (CSW) received SNF consult. PT is recommending home health. RN case manager aware of above. Please reconsult if future social work needs arise. CSW signing off.   Lenord Fralix, LCSW [phone] 

## 2017-05-28 NOTE — Care Management Note (Signed)
Case Management Note  Patient Details  Name: Kelly Nguyen MRN: [id number] Date of Birth: 1955-12-22  Subjective/Objective:  POD # 1 TKA. Met with patient and her daughter to discuss discharge plan.PAtient lives at home with her spouse. She will need a walker. Ordered from [facility]. Offered choice of home health agencies. Referral to [facility] for HHPT. [facility]acy: [facility] - [phone]. Will check cost.                   Action/Plan: [facility] for HHPT. [facility] for wale[facility].  Expected Discharge Date:                  Expected Discharge Plan:  [place]  In-House Referral:     Discharge planning Services  CM Consult  Post Acute Care Choice:  Durable Medical Equipment, Home Health Choice offered to:  Patient, Adult Children  DME Arranged:  Walker rolling DME Agency:  [place]:  PT [place]:  [facility]  Status of Service:  In process, will continue to follow  If discussed at Long Length of Stay Meetings, dates discussed:    Additional Comments:  Jolly Mango, RN 05/28/2017, 11:24 AM

## 2017-05-28 NOTE — Discharge Summary (Signed)
Physician Discharge Summary  Patient ID: Kelly Nguyen MRN: [id number] DOB/AGE: [birth year] 63 y.o.  Admit date: 05/27/2017 Discharge date: 05/29/2017  Admission Diagnoses:  PRIMARY OSTEOARTHRITIS RIGHT KNEE  Discharge Diagnoses: Patient Active Problem List   Diagnosis Date Noted  . S/P total knee arthroplasty 05/27/2017  . Lumbar radiculopathy 03/28/2016  . S/P hysterectomy 04/10/2015  . Uterine prolapse 04/09/2015  . Closed fracture of ankle 04/09/2015  . Abnormal EKG 04/09/2015  . Pre-op evaluation 04/09/2015  . Arthralgia of multiple joints 10/12/2007  . Colon, diverticulosis 08/28/2003  . Cardiac murmur 08/28/2003  . Diaphragmatic hernia 06/22/2003  . Essential (primary) hypertension 08/17/2002  Primary osteoarthritis of the right knee  Past Medical History:  Diagnosis Date  . Dysrhythmia   . GERD (gastroesophageal reflux disease)   . Heart valve problem   . History of hiatal hernia   . Hypertension   . Prolapse of uterus 2012  . Stress incontinence 2012     Transfusion: None.   Consultants (if any):   Discharged Condition: Improved  Hospital Course: Kelly Nguyen is an 61 y.o. female who was admitted 05/27/2017 with a diagnosis of degenerative arthrosis of the right knee and went to the operating room on 05/27/2017 and underwent the above named procedures.    Surgeries: Procedure(s): COMPUTER ASSISTED TOTAL KNEE ARTHROPLASTY on 05/27/2017 Patient tolerated the surgery well. Taken to PACU where she was stabilized and then transferred to the orthopedic floor.  Started on Lovenox 30mg  q 12 hrs. Foot pumps applied bilaterally at 80 mm. Heels elevated on bed with rolled towels. No evidence of DVT. Negative Homan. Physical therapy started on day #1 for gait training and transfer. OT started day #1 for ADL and assisted devices.  Patient's IV and foley were d/c on POD1.  Hemovac drain removed on POD2.  Implants: DePuy Attune size 5 posterior stabilized femoral component  (cemented), size 6 rotating platform tibial component (cemented), 41 mm medialized dome patella (cemented), and a 5 mm stabilized rotating platform polyethylene insert.  She was given perioperative antibiotics:  Anti-infectives (From admission, onward)   Start     Dose/Rate Route Frequency Ordered Stop   05/27/17 1400  ceFAZolin (ANCEF) 2 g in dextrose 5 % 100 mL IVPB     2 g 240 mL/hr over 30 Minutes Intravenous Every 6 hours 05/27/17 1313 05/28/17 1105   05/27/17 0647  ceFAZolin (ANCEF) 2-4 GM/100ML-% IVPB    Comments:  Phineas Real   : cabinet override      05/27/17 0647 05/27/17 0758   05/27/17 0600  ceFAZolin (ANCEF) IVPB 2g/100 mL premix     2 g 200 mL/hr over 30 Minutes Intravenous On call to O.R. 05/26/17 2144 05/27/17 0626    .  She was given sequential compression devices, early ambulation, and Lovenox for DVT prophylaxis.  She benefited maximally from the hospital stay and there were no complications.    Recent vital signs:  Vitals:   05/28/17 1954 05/29/17 0434  BP: (!) 102/55 (!) 96/43  Pulse: 71 63  Resp: 18 19  Temp: 98.2 F (36.8 C) 98.3 F (36.8 C)  SpO2: 99% 96%    Recent laboratory studies:  Lab Results  Component Value Date   HGB 13.6 05/13/2017   HGB 11.9 12/11/2016   HGB 8.1 (L) 04/11/2015   Lab Results  Component Value Date   WBC 4.4 05/13/2017   PLT 263 05/13/2017   Lab Results  Component Value Date   INR 0.94 05/13/2017   Lab Results  Component Value Date   NA 138 05/13/2017   K 3.6 05/13/2017   CL 101 05/13/2017   CO2 29 05/13/2017   BUN 22 (H) 05/13/2017   CREATININE 0.81 05/13/2017   GLUCOSE 113 (H) 05/13/2017    Discharge Medications:   Allergies as of 05/29/2017      Reactions   Acyclovir And Related    Meloxicam Rash      Medication List    TAKE these medications   celecoxib 200 MG capsule Commonly known as:  CELEBREX TAKE 1 CAPSULE BY MOUTH EVERY DAY   enoxaparin 40 MG/0.4ML injection Commonly known as:   LOVENOX Inject 0.4 mLs (40 mg total) into the skin daily.   fluticasone 50 MCG/ACT nasal spray Commonly known as:  FLONASE Place into both nostrils daily as needed for allergies or rhinitis.   ibuprofen 200 MG tablet Commonly known as:  ADVIL,MOTRIN Take 400-600 mg by mouth 2 (two) times daily as needed.   lisinopril-hydrochlorothiazide 10-12.5 MG tablet Commonly known as:  PRINZIDE,ZESTORETIC TAKE ONE TABLET DAILY   multivitamin with minerals tablet Take 1 tablet by mouth daily.   ondansetron 4 MG tablet Commonly known as:  ZOFRAN Take 1 tablet (4 mg total) by mouth every 6 (six) hours as needed for nausea.   oxyCODONE 5 MG immediate release tablet Commonly known as:  Oxy IR/ROXICODONE Take 1-2 tablets (5-10 mg total) by mouth every 4 (four) hours as needed for moderate pain or severe pain.   PRILOSEC OTC PO Take 0.25 tablets by mouth every morning. 1/4 of a tablet   PROBIOTIC DAILY PO Take 1 capsule by mouth every morning.   Probiotic Caps Take 1 capsule by mouth daily.   Turmeric 500 MG Caps Take 2 capsules by mouth daily.            Durable Medical Equipment  (From admission, onward)        Start     Ordered   05/27/17 1314  DME Walker rolling  Once    Question:  Patient needs a walker to treat with the following condition  Answer:  Total knee replacement status   05/27/17 1313   05/27/17 1314  DME Bedside commode  Once    Question:  Patient needs a bedside commode to treat with the following condition  Answer:  Total knee replacement status   05/27/17 1313      Diagnostic Studies: Dg Knee Right Port  Result Date: 05/27/2017 CLINICAL DATA:  Status post total knee replacement EXAM: PORTABLE RIGHT KNEE - 1-2 VIEW COMPARISON:  None. FINDINGS: Frontal and lateral views were obtained. Patient is status post total knee replacement with femoral and tibial prosthetic components well-seated. No fracture or dislocation. A drain is noted in the suprapatellar  bursa. There is soft tissue fluid and air in this area. No erosive change. IMPRESSION: Postoperative changes. Femoral and tibial prosthetic components well-seated. No fracture or dislocation. Electronically Signed   By: Lowella Grip III M.D.   On: 05/27/2017 11:52   Disposition:  Plan will be for discharge home on 05/29/17 with home health PT.  Follow-up Information    Watt Climes, PA Follow up on 06/11/2017.   Specialty:  Physician Assistant Why:  at 1:15pm Contact information: [place] [address] [phone]        Dereck Leep, MD Follow up on 07/09/2017.   Specialty:  Orthopedic Surgery Why:  at 9:45am Contact information: [address]  [place] [address] [phone]          Signed: Judson Roch PA-C 05/29/2017, 7:36 AM

## 2017-05-28 NOTE — Progress Notes (Signed)
  Subjective: 1 Day Post-Op Procedure(s) (LRB): COMPUTER ASSISTED TOTAL KNEE ARTHROPLASTY (Right) Patient reports pain as mild.   Patient is well, and has had no acute complaints or problems PT and care management to assist with discharge planning. Negative for chest pain and shortness of breath Fever: no Gastrointestinal:Negative for nausea and vomiting  Objective: Vital signs in last 24 hours: Temp:  [97.5 F (36.4 C)-98.7 F (37.1 C)] 97.7 F (36.5 C) (12/27 0352) Pulse Rate:  [54-80] 56 (12/27 0352) Resp:  [11-16] 15 (12/27 0352) BP: (96-147)/(52-82) 103/52 (12/27 0352) SpO2:  [94 %-100 %] 96 % (12/27 0352) Weight:  [81.6 kg (180 lb)] 81.6 kg (180 lb) (12/26 1313)  Intake/Output from previous day:  Intake/Output Summary (Last 24 hours) at 05/28/2017 0708 Last data filed at 05/28/2017 0400 Gross per 24 hour  Intake 2100 ml  Output 2550 ml  Net -450 ml    Intake/Output this shift: No intake/output data recorded.  Labs: No results for input(s): HGB in the last 72 hours. No results for input(s): WBC, RBC, HCT, PLT in the last 72 hours. No results for input(s): NA, K, CL, CO2, BUN, CREATININE, GLUCOSE, CALCIUM in the last 72 hours. No results for input(s): LABPT, INR in the last 72 hours.   EXAM General - Patient is Alert, Appropriate and Oriented Extremity - ABD soft Sensation intact distally Incision: dressing C/D/I No cellulitis present Dressing/Incision - clean, dry, no drainage, bulky dressing intact, hemovac intact. Motor Function - intact, moving foot and toes well on exam. Able to perform a straight leg raise with assistance.  Abdomen soft with normal BS.  Past Medical History:  Diagnosis Date  . Dysrhythmia   . GERD (gastroesophageal reflux disease)   . Heart valve problem   . History of hiatal hernia   . Hypertension   . Prolapse of uterus 2012  . Stress incontinence 2012    Assessment/Plan: 1 Day Post-Op Procedure(s) (LRB): COMPUTER ASSISTED  TOTAL KNEE ARTHROPLASTY (Right) Active Problems:   S/P total knee arthroplasty  Estimated body mass index is 27.78 kg/m as calculated from the following:   Height as of this encounter: 5' 7.5" (1.715 m).   Weight as of this encounter: 81.6 kg (180 lb). Advance diet Up with therapy D/C IV fluids when tolerating po intake.  Up with therapy today. Will remove dressing tomorrow and hemovac as well. Plan will be for discharge home tomorrow pending progress with PT. Begin working on having a BM at this time.  DVT Prophylaxis - Lovenox, Foot Pumps and TED hose Weight-Bearing as tolerated to right leg  J. Cameron Proud, PA-C [facility] Orthopaedic Surgery 05/28/2017, 7:08 AM

## 2017-05-28 NOTE — Anesthesia Postprocedure Evaluation (Signed)
Anesthesia Post Note  Patient: Kelly Nguyen  Procedure(s) Performed: COMPUTER ASSISTED TOTAL KNEE ARTHROPLASTY (Right Knee)  Patient location during evaluation: Nursing Unit Anesthesia Type: Spinal Level of consciousness: awake, awake and alert and oriented Pain management: pain level controlled Vital Signs Assessment: post-procedure vital signs reviewed and stable Respiratory status: spontaneous breathing, nonlabored ventilation and respiratory function stable Cardiovascular status: blood pressure returned to baseline and stable Postop Assessment: no headache and no backache Anesthetic complications: no     Last Vitals:  Vitals:   05/28/17 0352 05/28/17 0741  BP: (!) 103/52 113/69  Pulse: (!) 56 60  Resp: 15 18  Temp: 36.5 C 36.7 C  SpO2: 96% 100%    Last Pain:  Vitals:   05/28/17 0741  TempSrc: Oral  PainSc:                  Johnna Acosta

## 2017-05-29 MED ORDER — ONDANSETRON HCL 4 MG PO TABS: 4.0000 mg | ORAL_TABLET | Freq: Four times a day (QID) | ORAL | 0 refills | Status: DC | PRN

## 2017-05-29 NOTE — Progress Notes (Signed)
  Subjective: 2 Days Post-Op Procedure(s) (LRB): COMPUTER ASSISTED TOTAL KNEE ARTHROPLASTY (Right) Patient reports pain as moderate.   Patient is well, and has had no acute complaints or problems Plan for discharge home today with HHPT. Negative for chest pain and shortness of breath Fever: no Gastrointestinal:Positive for nausea.  Objective: Vital signs in last 24 hours: Temp:  [97.8 F (36.6 C)-98.3 F (36.8 C)] 98.3 F (36.8 C) (12/28 0434) Pulse Rate:  [60-71] 63 (12/28 0434) Resp:  [18-19] 19 (12/28 0434) BP: (96-127)/(43-69) 96/43 (12/28 0434) SpO2:  [96 %-100 %] 96 % (12/28 0434)  Intake/Output from previous day:  Intake/Output Summary (Last 24 hours) at 05/29/2017 0733 Last data filed at 05/29/2017 0447 Gross per 24 hour  Intake 1020 ml  Output 196 ml  Net 824 ml    Intake/Output this shift: No intake/output data recorded.  Labs: No results for input(s): HGB in the last 72 hours. No results for input(s): WBC, RBC, HCT, PLT in the last 72 hours. No results for input(s): NA, K, CL, CO2, BUN, CREATININE, GLUCOSE, CALCIUM in the last 72 hours. No results for input(s): LABPT, INR in the last 72 hours.   EXAM General - Patient is Alert, Appropriate and Oriented Extremity - ABD soft Sensation intact distally Incision: dressing C/D/I No cellulitis present Dressing/Incision - clean, dry, no drainage, bulky dressing removed today, hemovac removed and 4x4 dressing applied. Motor Function - intact, moving foot and toes well on exam. Able to perform a straight leg raise with assistance.  Abdomen soft with normal BS.  Past Medical History:  Diagnosis Date  . Dysrhythmia   . GERD (gastroesophageal reflux disease)   . Heart valve problem   . History of hiatal hernia   . Hypertension   . Prolapse of uterus 2012  . Stress incontinence 2012    Assessment/Plan: 2 Days Post-Op Procedure(s) (LRB): COMPUTER ASSISTED TOTAL KNEE ARTHROPLASTY (Right) Active Problems:  S/P total knee arthroplasty  Estimated body mass index is 27.78 kg/m as calculated from the following:   Height as of this encounter: 5' 7.5" (1.715 m).   Weight as of this encounter: 81.6 kg (180 lb). Discharge home with home health  Up with therapy today. Hemovac and bulky dressing removed. Needs to have a BM, FLEET enema as needed. Plan for discharge home this afternoon.  DVT Prophylaxis - Lovenox, Foot Pumps and TED hose Weight-Bearing as tolerated to right leg  J. Cameron Proud, PA-C [facility] Orthopaedic Surgery 05/29/2017, 7:33 AM

## 2017-05-29 NOTE — Plan of Care (Signed)
  Completed/Met Education: Knowledge of General Education information will improve 05/29/2017 1017 - Completed/Met by Milderd Meager, RN Health Behavior/Discharge Planning: Ability to manage health-related needs will improve 05/29/2017 1017 - Completed/Met by Milderd Meager, RN Clinical Measurements: Ability to maintain clinical measurements within normal limits will improve 05/29/2017 1017 - Completed/Met by Milderd Meager, RN Will remain free from infection 05/29/2017 1017 - Completed/Met by Milderd Meager, RN Diagnostic test results will improve 05/29/2017 1017 - Completed/Met by Milderd Meager, RN Respiratory complications will improve 05/29/2017 1017 - Completed/Met by Milderd Meager, RN Cardiovascular complication will be avoided 05/29/2017 1017 - Completed/Met by Milderd Meager, RN Activity: Risk for activity intolerance will decrease 05/29/2017 1017 - Completed/Met by Milderd Meager, RN Nutrition: Adequate nutrition will be maintained 05/29/2017 1017 - Completed/Met by Milderd Meager, RN Coping: Level of anxiety will decrease 05/29/2017 1017 - Completed/Met by Milderd Meager, RN Elimination: Will not experience complications related to bowel motility 05/29/2017 1017 - Completed/Met by Milderd Meager, RN Will not experience complications related to urinary retention 05/29/2017 1017 - Completed/Met by Milderd Meager, RN Pain Managment: General experience of comfort will improve 05/29/2017 1017 - Completed/Met by Milderd Meager, RN Safety: Ability to remain free from injury will improve 05/29/2017 1017 - Completed/Met by Milderd Meager, RN Skin Integrity: Risk for impaired skin integrity will decrease 05/29/2017 1017 - Completed/Met by Milderd Meager, RN Education: Knowledge of the prescribed therapeutic regimen will improve 05/29/2017 1017 - Completed/Met by Milderd Meager, RN Activity: Ability to avoid  complications of mobility impairment will improve 05/29/2017 1017 - Completed/Met by Milderd Meager, RN Range of joint motion will improve 05/29/2017 1017 - Completed/Met by Milderd Meager, RN Clinical Measurements: Postoperative complications will be avoided or minimized 05/29/2017 1017 - Completed/Met by Milderd Meager, RN Pain Management: Pain level will decrease with appropriate interventions 05/29/2017 1017 - Completed/Met by Milderd Meager, RN Skin Integrity: Signs of wound healing will improve 05/29/2017 1017 - Completed/Met by Milderd Meager, RN

## 2017-05-29 NOTE — Progress Notes (Signed)
Physical Therapy Treatment Patient Details Name: Kelly Nguyen MRN: [id number] DOB: 23-Apr-1956 Today's Date: 05/29/2017    History of Present Illness Pt. is a 61 y.o. female who was admitted to [facility] for a Right TKR.    PT Comments    Pt presented in bed agreeable to therapy. Pt continues to demonstrate improvement with functional mobility and able to perform bed mobility and transfers at overall supervision level. Pt was able to perform seated and standing therex with good demo and tolerance. Pt demonstrated improved knee flexion with gait and less compensatory strategy. Anticipate d/c home with HHPT later today.   Follow Up Recommendations        Equipment Recommendations       Recommendations for Other Services       Precautions / Restrictions Precautions Precautions: Fall;Knee Restrictions Weight Bearing Restrictions: Yes RLE Weight Bearing: Weight bearing as tolerated    Mobility  Bed Mobility Overal bed mobility: Modified Independent Bed Mobility: Supine to Sit     Supine to sit: Modified independent (Device/Increase time)     General bed mobility comments: HOB elevated  Transfers Overall transfer level: Needs assistance Equipment used: Rolling walker (2 wheeled) Transfers: Sit to/from Stand Sit to Stand: Modified independent (Device/Increase time)         General transfer comment: Pt demonstrating good safety with transfers, provided pt edu on car transfer  Ambulation/Gait Ambulation/Gait assistance: Supervision Ambulation Distance (Feet): 300 Feet Assistive device: Rolling walker (2 wheeled) Gait Pattern/deviations: Step-through pattern;Decreased step length - right;Decreased stance time - right Gait velocity: decreased Gait velocity interpretation: Below normal speed for age/gender General Gait Details: cues for increasing R knee flexion during ambulation   Stairs            Wheelchair Mobility    Modified Rankin (Stroke Patients Only)        Balance Overall balance assessment: Needs assistance Sitting-balance support: No upper extremity supported;Feet supported Sitting balance-Leahy Scale: Good     Standing balance support: No upper extremity supported Standing balance-Leahy Scale: Fair                              Cognition Arousal/Alertness: Awake/alert Behavior During Therapy: WFL for tasks assessed/performed Overall Cognitive Status: Within Functional Limits for tasks assessed                                        Exercises Total Joint Exercises Ankle Circles/Pumps: Strengthening;Both;10 reps;Seated Quad Sets: Strengthening;Right;10 reps Hip ABduction/ADduction: Strengthening;Right;10 reps;Standing Straight Leg Raises: Strengthening;Right;10 reps;Standing Knee Flexion: AROM;Right;10 reps;Standing Goniometric ROM: 0-75(significant guarding due to pain)    General Comments        Pertinent Vitals/Pain Pain Assessment: 0-10 Pain Score: 5  Pain Location: R knee Pain Descriptors / Indicators: Aching;Operative site guarding Pain Intervention(s): Monitored during session    Home Living                      Prior Function            PT Goals (current goals can now be found in the care plan section) Acute Rehab PT Goals Patient Stated Goal: To return home PT Goal Formulation: With patient Time For Goal Achievement: 06/10/17 Potential to Achieve Goals: Good Progress towards PT goals: Progressing toward goals    Frequency  PT Plan      Co-evaluation              AM-PAC PT "6 Clicks" Daily Activity  Outcome Measure  Difficulty turning over in bed (including adjusting bedclothes, sheets and blankets)?: A Little Difficulty moving from lying on back to sitting on the side of the bed? : A Little Difficulty sitting down on and standing up from a chair with arms (e.g., wheelchair, bedside commode, etc,.)?: A Little Help needed moving to  and from a bed to chair (including a wheelchair)?: None Help needed walking in hospital room?: A Little Help needed climbing 3-5 steps with a railing? : A Little 6 Click Score: 19    End of Session Equipment Utilized During Treatment: Gait belt Activity Tolerance: Patient tolerated treatment well Patient left: in chair;with call bell/phone within reach;with chair alarm set         Time: 9563-8756 PT Time Calculation (min) (ACUTE ONLY): 24 min  Charges:  $Gait Training: 8-22 mins $Therapeutic Exercise: 8-22 mins             Donovyn Guidice  Cherrell Maybee, PTA 05/29/2017, 9:43 AM

## 2017-05-29 NOTE — Care Management Note (Signed)
Case Management Note  Patient Details  Name: Chantee Cerino MRN: [id number] Date of Birth: 05/21/1956  Subjective/Objective:  Discharging today                  Action/Plan: Cost of Lovenox is $ 0. Updated patient [facility] notified of discharge.   Expected Discharge Date:  05/29/17               Expected Discharge Plan:  [place]  In-House Referral:     Discharge planning Services  CM Consult  Post Acute Care Choice:  Durable Medical Equipment, Home Health Choice offered to:  Patient, Adult Children  DME Arranged:  Walker rolling DME Agency:  [place]:  PT [place]:  [facility] ([facility]  Status of Service:  Completed, signed off  If discussed at [employer], dates discussed:    Additional Comments:  Jolly Mango, RN 05/29/2017, 8:51 AM

## 2017-07-17 ENCOUNTER — Ambulatory Visit: Payer: BLUE CROSS/BLUE SHIELD | Admitting: Physician Assistant

## 2017-09-25 ENCOUNTER — Other Ambulatory Visit: Payer: Self-pay | Admitting: Physician Assistant

## 2017-09-25 DIAGNOSIS — M1990 Unspecified osteoarthritis, unspecified site: Secondary | ICD-10-CM

## 2018-01-11 ENCOUNTER — Other Ambulatory Visit: Payer: Self-pay | Admitting: Physician Assistant

## 2018-01-11 DIAGNOSIS — I10 Essential (primary) hypertension: Secondary | ICD-10-CM

## 2018-01-11 NOTE — Telephone Encounter (Signed)
Refilled 30 days hasn't been seen in > 1 year, needs appt before more refills. Please schedule.

## 2018-01-12 ENCOUNTER — Telehealth: Payer: Self-pay | Admitting: Physician Assistant

## 2018-01-12 NOTE — Telephone Encounter (Signed)
Pt needs refill on   Lisinopril 10-12 mg  She uses [facility]  She would like it sent today.  She is going out of town Management consultant

## 2018-01-14 NOTE — Telephone Encounter (Signed)
RX sent in.   Thanks,   -Mickel Baas

## 2018-01-18 NOTE — Telephone Encounter (Signed)
Left message advising pt to schedule an office visit.   Thanks,   -Mickel Baas

## 2018-02-05 ENCOUNTER — Encounter: Payer: Self-pay | Admitting: Physician Assistant

## 2018-02-05 ENCOUNTER — Ambulatory Visit: Payer: BLUE CROSS/BLUE SHIELD | Admitting: Physician Assistant

## 2018-02-05 VITALS — BP 120/76 | HR 84 | Temp 98.4°F | Resp 16 | Wt 180.0 lb

## 2018-02-05 DIAGNOSIS — E7800 Pure hypercholesterolemia, unspecified: Secondary | ICD-10-CM

## 2018-02-05 DIAGNOSIS — I10 Essential (primary) hypertension: Secondary | ICD-10-CM | POA: Diagnosis not present

## 2018-02-05 MED ORDER — LISINOPRIL-HYDROCHLOROTHIAZIDE 10-12.5 MG PO TABS: 1.0000 | ORAL_TABLET | Freq: Every day | ORAL | 0 refills | Status: DC

## 2018-02-05 NOTE — Progress Notes (Signed)
Patient: Kelly Nguyen Female    DOB: 1956/04/01   62 y.o.   MRN: [id number] Visit Date: 02/05/2018  Today's Provider: Trinna Post, PA-C   Chief Complaint  Patient presents with  . Follow-up   Subjective:    HPI  Hypertension, follow-up:  BP Readings from Last 3 Encounters:  02/05/18 120/76  05/29/17 113/63  05/13/17 132/82    She was last seen for hypertension 1 years ago.  BP at that visit was 106/60. Management changes since that visit include . She reports excellent compliance with treatment. She is not having side effects.  She is exercising. She is adherent to low salt diet.   Outside blood pressures are stable. She is experiencing none.  Patient denies chest pain and lower extremity edema.   Cardiovascular risk factors include none.  Use of agents associated with hypertension: none.     Weight trend: stable Wt Readings from Last 3 Encounters:  02/05/18 180 lb (81.6 kg)  05/27/17 180 lb (81.6 kg)  05/13/17 181 lb (82.1 kg)    Current diet: in general, a "healthy" diet    ------------------------------------------------------------------------      Allergies  Allergen Reactions  . Acyclovir And Related   . Meloxicam Rash     Current Outpatient Medications:  .  celecoxib (CELEBREX) 200 MG capsule, TAKE 1 CAPSULE EVERY DAY, Disp: 90 capsule, Rfl: 1 .  ibuprofen (ADVIL,MOTRIN) 200 MG tablet, Take 400-600 mg by mouth 2 (two) times daily as needed., Disp: , Rfl:  .  lisinopril-hydrochlorothiazide (PRINZIDE,ZESTORETIC) 10-12.5 MG tablet, TAKE ONE TABLET EVERY DAY, Disp: 30 tablet, Rfl: 0 .  Multiple Vitamins-Minerals (MULTIVITAMIN WITH MINERALS) tablet, Take 1 tablet by mouth daily., Disp: , Rfl:  .  Omeprazole Magnesium (PRILOSEC OTC PO), Take 0.25 tablets by mouth every morning. 1/4 of a tablet, Disp: , Rfl:  .  Probiotic CAPS, Take 1 capsule by mouth daily., Disp: , Rfl:   Review of Systems  Constitutional: Negative.   Respiratory:  Negative.   Cardiovascular: Negative.   Allergic/Immunologic: Positive for environmental allergies.    Social History   Tobacco Use  . Smoking status: Never Smoker  . Smokeless tobacco: Never Used  Substance Use Topics  . Alcohol use: Yes    Alcohol/week: 7.0 standard drinks    Types: 7 Glasses of wine per week   Objective:   BP 120/76 (BP Location: Left Arm, Patient Position: Sitting, Cuff Size: Normal)   Pulse 84   Temp 98.4 F (36.9 C) (Oral)   Resp 16   Wt 180 lb (81.6 kg)   SpO2 98%   BMI 27.78 kg/m  Vitals:   02/05/18 1623  BP: 120/76  Pulse: 84  Resp: 16  Temp: 98.4 F (36.9 C)  TempSrc: Oral  SpO2: 98%  Weight: 180 lb (81.6 kg)     Physical Exam  Constitutional: She is oriented to person, place, and time. She appears well-developed and well-nourished.  Cardiovascular: Normal rate and regular rhythm.  Pulmonary/Chest: Effort normal and breath sounds normal.  Neurological: She is alert and oriented to person, place, and time.  Skin: Skin is warm and dry.  Psychiatric: She has a normal mood and affect. Her behavior is normal.        Assessment & Plan:     1. Essential (primary) hypertension  Please come back for physical, will refill as below.  - Comprehensive Metabolic Panel (CMET) - lisinopril-hydrochlorothiazide (PRINZIDE,ZESTORETIC) 10-12.5 MG tablet; Take 1 tablet by mouth  daily.  Dispense: 90 tablet; Refill: 0  2. Hypercholesterolemia  - Lipid Profile  Return in about 1 month (around 03/07/2018) for cpe.  The entirety of the information documented in the History of Present Illness, Review of Systems and Physical Exam were personally obtained by me. Portions of this information were initially documented by Lynford Humphrey, CMA and reviewed by me for thoroughness and accuracy.         Trinna Post, PA-C  [facility]

## 2018-02-05 NOTE — Patient Instructions (Signed)

## 2018-02-09 ENCOUNTER — Other Ambulatory Visit: Payer: Self-pay | Admitting: Physician Assistant

## 2018-02-09 DIAGNOSIS — Z1231 Encounter for screening mammogram for malignant neoplasm of breast: Secondary | ICD-10-CM

## 2018-02-19 ENCOUNTER — Ambulatory Visit
Admission: RE | Admit: 2018-02-19 | Discharge: 2018-02-19 | Disposition: A | Discharge status: Home / Self Care | Payer: BLUE CROSS/BLUE SHIELD | Source: Ambulatory Visit | Attending: Physician Assistant | Admitting: Physician Assistant

## 2018-02-19 DIAGNOSIS — Z1231 Encounter for screening mammogram for malignant neoplasm of breast: Secondary | ICD-10-CM

## 2018-03-10 ENCOUNTER — Encounter: Payer: Self-pay | Admitting: Physician Assistant

## 2018-03-10 ENCOUNTER — Ambulatory Visit (INDEPENDENT_AMBULATORY_CARE_PROVIDER_SITE_OTHER): Payer: BLUE CROSS/BLUE SHIELD | Admitting: Physician Assistant

## 2018-03-10 VITALS — BP 140/86 | HR 60 | Temp 98.0°F | Resp 16 | Ht 65.0 in | Wt 180.0 lb

## 2018-03-10 DIAGNOSIS — Z114 Encounter for screening for human immunodeficiency virus [HIV]: Secondary | ICD-10-CM

## 2018-03-10 DIAGNOSIS — I10 Essential (primary) hypertension: Secondary | ICD-10-CM | POA: Diagnosis not present

## 2018-03-10 DIAGNOSIS — Z1159 Encounter for screening for other viral diseases: Secondary | ICD-10-CM

## 2018-03-10 DIAGNOSIS — Z0000 Encounter for general adult medical examination without abnormal findings: Secondary | ICD-10-CM | POA: Diagnosis not present

## 2018-03-10 DIAGNOSIS — M1990 Unspecified osteoarthritis, unspecified site: Secondary | ICD-10-CM

## 2018-03-10 DIAGNOSIS — Z2821 Immunization not carried out because of patient refusal: Secondary | ICD-10-CM

## 2018-03-10 DIAGNOSIS — E7800 Pure hypercholesterolemia, unspecified: Secondary | ICD-10-CM

## 2018-03-10 NOTE — Progress Notes (Signed)
Patient: Kelly Nguyen, Female    DOB: 08-22-1955, 62 y.o.   MRN: [id number] Visit Date: 03/10/2018  Today's Provider: Trinna Post, PA-C   Chief Complaint  Patient presents with  . Annual Exam   Subjective:    Annual physical exam Kelly Nguyen is a 62 y.o. female who presents today for health maintenance and complete physical. She feels well. She reports exercising four days a week. She reports she is sleeping well. 12/24/10 Pap/HPV-negative, hysterectomy 02/19/18 Mammogram-BI-RADS 1 02/27/17 Colonoscopy-Diverticulosis, polyps Pathology-tubular adenoma repeat 5 years  04/25/2013 Tetanus updated   HTN: Currently taking Lisinopril-HCTZ 10-12.5 mg and she is doing well with it. She denies chest pain, SOB. She denies headache.   Arthritis: Currently takes Celebrex 200 mg daily for arthritis. She has history of total right knee replacement.  -----------------------------------------------------------------   Review of Systems  Constitutional: Negative.   HENT: Negative.   Eyes: Negative.   Respiratory: Negative.   Cardiovascular: Negative.   Gastrointestinal: Positive for diarrhea.  Endocrine: Negative.   Genitourinary: Negative.   Musculoskeletal: Negative.   Skin: Negative.   Allergic/Immunologic: Negative.   Neurological: Negative.   Hematological: Negative.   Psychiatric/Behavioral: Negative.     Social History      She  reports that she has never smoked. She has never used smokeless tobacco. She reports that she drinks about 7.0 standard drinks of alcohol per week. She reports that she does not use drugs.       Social History   Socioeconomic History  . Marital status: Married    Spouse name: Broadus John  . Number of children: 1  . Years of education: some college  . Highest education level: Not on file  Occupational History  . Occupation: Retired    Comment: from [employer]  . Financial resource strain: Not on file  . Food insecurity:   Worry: Not on file    Inability: Not on file  . Transportation needs:    Medical: Not on file    Non-medical: Not on file  Tobacco Use  . Smoking status: Never Smoker  . Smokeless tobacco: Never Used  Substance and Sexual Activity  . Alcohol use: Yes    Alcohol/week: 7.0 standard drinks    Types: 7 Glasses of wine per week  . Drug use: No  . Sexual activity: Yes    Birth control/protection: Surgical  Lifestyle  . Physical activity:    Days per week: Not on file    Minutes per session: Not on file  . Stress: Not on file  Relationships  . Social connections:    Talks on phone: Not on file    Gets together: Not on file    Attends religious service: Not on file    Active member of club or organization: Not on file    Attends meetings of clubs or organizations: Not on file    Relationship status: Not on file  Other Topics Concern  . Not on file  Social History Narrative  . Not on file    Past Medical History:  Diagnosis Date  . Dysrhythmia   . GERD (gastroesophageal reflux disease)   . Heart valve problem   . History of hiatal hernia   . Hypertension   . Prolapse of uterus 2012  . Stress incontinence 2012     Patient Active Problem List   Diagnosis Date Noted  . Status post total right knee replacement 05/27/2017  . Lumbar radiculopathy  03/28/2016  . S/P hysterectomy 04/10/2015  . Uterine prolapse 04/09/2015  . Closed fracture of ankle 04/09/2015  . Abnormal EKG 04/09/2015  . Pre-op evaluation 04/09/2015  . Arthralgia of multiple joints 10/12/2007  . Colon, diverticulosis 08/28/2003  . Cardiac murmur 08/28/2003  . Diaphragmatic hernia 06/22/2003  . Essential (primary) hypertension 08/17/2002    Past Surgical History:  Procedure Laterality Date  . ABDOMINAL HYSTERECTOMY    . CARDIAC CATHETERIZATION    . CHOLECYSTECTOMY    . COLONOSCOPY WITH PROPOFOL N/A 02/27/2017   Procedure: COLONOSCOPY WITH PROPOFOL;  Surgeon: Jonathon Bellows, MD;  Location[facility] ENDOSCOPY;   Service: Gastroenterology;  Laterality: N/A;  . CYSTOCELE REPAIR N/A 04/10/2015   Procedure: ANTERIOR REPAIR (CYSTOCELE);  Surgeon: Gae Dry, MD;  Location: [facility] ORS;  Service: Gynecology;  Laterality: N/A;  . HALLUX VALGUS CORRECTION Right 2004  . HAMMER TOE SURGERY    . KNEE ARTHROPLASTY Right 05/27/2017   Procedure: COMPUTER ASSISTED TOTAL KNEE ARTHROPLASTY;  Surgeon: Dereck Leep, MD;  Location: [facility] ORS;  Service: Orthopedics;  Laterality: Right;  . ROTATOR CUFF REPAIR Right 2004  . SALPINGOOPHORECTOMY Bilateral 04/10/2015   Procedure: SALPINGO OOPHORECTOMY;  Surgeon: Gae Dry, MD;  Location: [facility] ORS;  Service: Gynecology;  Laterality: Bilateral;  vaginal  . VAGINAL HYSTERECTOMY  04/10/2015   Procedure: HYSTERECTOMY VAGINAL, ;  Surgeon: Gae Dry, MD;  Location: [facility] ORS;  Service: Gynecology;;    Family History        Family Status  Relation Name Status  . Mother  Alive  . Father  Deceased at age 71  . Sister  Alive  . Other Maternal Side (Not Specified)  . PGM  (Not Specified)        Her family history includes Anxiety disorder in her sister; Breast cancer (age of onset: 1) in her paternal grandmother; CAD in her father; Depression in her sister; Diabetes in her other; Healthy in her mother; Heart disease in her father; Hypertension in her sister; Mental illness in her mother; Migraines in her sister; Schizophrenia in her mother and sister.      Allergies  Allergen Reactions  . Acyclovir And Related   . Meloxicam Rash     Current Outpatient Medications:  .  celecoxib (CELEBREX) 200 MG capsule, TAKE 1 CAPSULE EVERY DAY, Disp: 90 capsule, Rfl: 1 .  ibuprofen (ADVIL,MOTRIN) 200 MG tablet, Take 400-600 mg by mouth 2 (two) times daily as needed., Disp: , Rfl:  .  lisinopril-hydrochlorothiazide (PRINZIDE,ZESTORETIC) 10-12.5 MG tablet, Take 1 tablet by mouth daily., Disp: 90 tablet, Rfl: 0 .  Multiple Vitamins-Minerals (MULTIVITAMIN WITH MINERALS) tablet,  Take 1 tablet by mouth daily., Disp: , Rfl:  .  Omeprazole Magnesium (PRILOSEC OTC PO), Take 0.25 tablets by mouth every morning. 1/4 of a tablet, Disp: , Rfl:  .  Probiotic CAPS, Take 1 capsule by mouth daily., Disp: , Rfl:    Patient Care Team: Paulene Floor as PCP - General (Physician Assistant)      Objective:   Vitals: BP 140/86 (BP Location: Left Arm, Patient Position: Sitting, Cuff Size: Normal)   Pulse 60   Temp 98 F (36.7 C) (Oral)   Resp 16   Ht 5\' 5"  (1.651 m)   Wt 180 lb (81.6 kg)   SpO2 98%   BMI 29.95 kg/m    Vitals:   03/10/18 1014  BP: 140/86  Pulse: 60  Resp: 16  Temp: 98 F (36.7 C)  TempSrc: Oral  SpO2: 98%  Weight: 180 lb (81.6 kg)  Height: 5\' 5"  (1.651 m)     Physical Exam  Constitutional: She is oriented to person, place, and time. She appears well-developed and well-nourished.  Cardiovascular: Normal rate and regular rhythm.  Pulmonary/Chest: Effort normal and breath sounds normal.  Declines breast exam.   Abdominal: Soft. Bowel sounds are normal.  Neurological: She is alert and oriented to person, place, and time.  Skin: Skin is warm and dry.  Psychiatric: She has a normal mood and affect. Her behavior is normal.     Depression Screen PHQ 2/9 Scores 03/10/2018 12/11/2016  PHQ - 2 Score 0 0  PHQ- 9 Score 0 -      Assessment & Plan:     Routine Health Maintenance and Physical Exam  Exercise Activities and Dietary recommendations Goals   None     Immunization History  Administered Date(s) Administered  . Td 09/19/2004  . Tdap 04/25/2013    Health Maintenance  Topic Date Due  . Hepatitis C Screening  04-16-1956  . HIV Screening  08/03/1968  . INFLUENZA VACCINE  03/11/2019 (Originally 12/31/2017)  . MAMMOGRAM  02/20/2020  . COLONOSCOPY  02/27/2022  . TETANUS/TDAP  04/26/2023     Discussed health benefits of physical activity, and encouraged her to engage in regular exercise appropriate for her age and  condition.    1. Annual physical exam  She is UTD on screening examinations.  - TSH - Lipid Panel With LDL/HDL Ratio  2. Essential (primary) hypertension  BP is slightly high today but has been controlled otherwise, will keep medications the same.   - CBC with Differential/Platelet - Comprehensive metabolic panel - TSH - Lipid Panel With LDL/HDL Ratio  3. Hypercholesterolemia  - TSH - Lipid Panel With LDL/HDL Ratio  4. Influenza vaccination declined   5. Encounter for screening for HIV  - HIV antibody (with reflex)  6. Need for hepatitis C screening test  - Hepatitis C antibody  7. Arthritis  Takes celebrex frequently. Counseled that this puts her at risk for increased bleeding, stomach ulcers, and kidney injury. Recommended she take Tylenol as primary medication for arthritis and can use Celebrex if pain is especially bad.   Return in about 6 months (around 09/09/2018) for HTN.  The entirety of the information documented in the History of Present Illness, Review of Systems and Physical Exam were personally obtained by me. Portions of this information were initially documented by Lynford Humphrey, CMA and reviewed by me for thoroughness and accuracy.   --------------------------------------------------------------------    Trinna Post, PA-C  [facility]

## 2018-03-10 NOTE — Patient Instructions (Signed)

## 2018-03-11 ENCOUNTER — Telehealth: Payer: Self-pay

## 2018-03-11 NOTE — Telephone Encounter (Signed)
-----   Message from Trinna Post, [place] sent at 03/11/2018  2:54 PM EDT ----- Labwork stable. LDL is slightly high but cholesterol does not need medication treatment yet.

## 2018-03-11 NOTE — Telephone Encounter (Signed)
Patient advised as below. Patient verbalizes understanding and is in agreement with treatment plan.  

## 2018-03-13 LAB — CBC WITH DIFFERENTIAL/PLATELET
Basophils Absolute: 0 10*3/uL (ref 0.0–0.2)
Basos: 1 %
EOS (ABSOLUTE): 0.2 10*3/uL (ref 0.0–0.4)
Eos: 4 %
Hematocrit: 35.4 % (ref 34.0–46.6)
Hemoglobin: 11.1 g/dL (ref 11.1–15.9)
Immature Grans (Abs): 0 10*3/uL (ref 0.0–0.1)
Immature Granulocytes: 1 %
Lymphocytes Absolute: 1.3 10*3/uL (ref 0.7–3.1)
Lymphs: 34 %
MCH: 27.1 pg (ref 26.6–33.0)
MCHC: 31.4 g/dL — ABNORMAL LOW (ref 31.5–35.7)
MCV: 86 fL (ref 79–97)
Monocytes Absolute: 0.4 10*3/uL (ref 0.1–0.9)
Monocytes: 11 %
Neutrophils Absolute: 2 10*3/uL (ref 1.4–7.0)
Neutrophils: 49 %
Platelets: 278 10*3/uL (ref 150–450)
RBC: 4.1 x10E6/uL (ref 3.77–5.28)
RDW: 13.5 % (ref 12.3–15.4)
WBC: 3.9 10*3/uL (ref 3.4–10.8)

## 2018-03-13 LAB — COMPREHENSIVE METABOLIC PANEL
ALT: 30 IU/L (ref 0–32)
AST: 31 IU/L (ref 0–40)
Albumin/Globulin Ratio: 1.8 (ref 1.2–2.2)
Albumin: 4.1 g/dL (ref 3.6–4.8)
Alkaline Phosphatase: 102 IU/L (ref 39–117)
BUN/Creatinine Ratio: 27 (ref 12–28)
BUN: 22 mg/dL (ref 8–27)
Bilirubin Total: 0.4 mg/dL (ref 0.0–1.2)
CO2: 24 mmol/L (ref 20–29)
Calcium: 9.4 mg/dL (ref 8.7–10.3)
Chloride: 103 mmol/L (ref 96–106)
Creatinine, Ser: 0.83 mg/dL (ref 0.57–1.00)
GFR calc Af Amer: 86 mL/min/{1.73_m2} (ref >59)
GFR calc non Af Amer: 75 mL/min/{1.73_m2} (ref >59)
Globulin, Total: 2.3 g/dL (ref 1.5–4.5)
Glucose: 95 mg/dL (ref 65–99)
Potassium: 4.3 mmol/L (ref 3.5–5.2)
Sodium: 142 mmol/L (ref 134–144)
Total Protein: 6.4 g/dL (ref 6.0–8.5)

## 2018-03-13 LAB — LIPID PANEL WITH LDL/HDL RATIO
Cholesterol, Total: 186 mg/dL (ref 100–199)
HDL: 45 mg/dL (ref >39)
LDL Calculated: 124 mg/dL — ABNORMAL HIGH (ref 0–99)
LDl/HDL Ratio: 2.8 ratio (ref 0.0–3.2)
Triglycerides: 87 mg/dL (ref 0–149)
VLDL Cholesterol Cal: 17 mg/dL (ref 5–40)

## 2018-03-13 LAB — TSH: TSH: 0.911 u[IU]/mL (ref 0.450–4.500)

## 2018-03-13 LAB — HIV 1/2 AB DIFFERENTIATION
HIV 1 Ab: NEGATIVE
HIV 2 Ab: NEGATIVE
NOTE (HIV CONF MULTIP: NEGATIVE

## 2018-03-13 LAB — RNA QUALITATIVE: HIV 1 RNA Qualitative: NEGATIVE

## 2018-03-13 LAB — HEPATITIS C ANTIBODY: Hep C Virus Ab: <0.1 s/co ratio (ref 0.0–0.9)

## 2018-03-13 LAB — HIV ANTIBODY (ROUTINE TESTING W REFLEX): HIV Screen 4th Generation wRfx: REACTIVE — AB

## 2018-03-17 ENCOUNTER — Other Ambulatory Visit: Payer: Self-pay | Admitting: Physician Assistant

## 2018-03-19 ENCOUNTER — Telehealth: Payer: Self-pay

## 2018-03-19 NOTE — Telephone Encounter (Signed)
-----   Message from Trinna Post, [place] sent at 03/19/2018 10:52 AM EDT ----- Her HIV screen came back reactive but follow up testing showed the labs were negative and I suspect this is likely a false positive. If she has had new sexual partners or any exposures to contaminated needles we can consider testing again in 6 months.

## 2018-03-19 NOTE — Telephone Encounter (Signed)
Patient advised as below. Patient does not want to have test repeated.

## 2018-05-03 ENCOUNTER — Other Ambulatory Visit: Payer: Self-pay | Admitting: Physician Assistant

## 2018-05-03 DIAGNOSIS — I10 Essential (primary) hypertension: Secondary | ICD-10-CM

## 2018-05-20 ENCOUNTER — Other Ambulatory Visit: Payer: Self-pay | Admitting: Physician Assistant

## 2018-05-20 DIAGNOSIS — M1990 Unspecified osteoarthritis, unspecified site: Secondary | ICD-10-CM

## 2018-08-02 ENCOUNTER — Telehealth: Payer: Self-pay | Admitting: Physician Assistant

## 2018-08-02 NOTE — Telephone Encounter (Signed)
She needs Welcome to [employer] visit right?  Thanks,   -Mickel Baas

## 2018-08-02 NOTE — Telephone Encounter (Signed)
Yes welcome to medicare, thank you. It looks like she has appointment scheduled on 08/11/2018 not sure if this is welcome to medicare.

## 2018-08-02 NOTE — Telephone Encounter (Signed)
Pt has turned 63 and calling back to find out what visit she needs that Fabio Bering had said she needs after turning 63.  Please call pt back and advise.  Thanks, [employer]

## 2018-08-03 ENCOUNTER — Other Ambulatory Visit: Payer: Self-pay | Admitting: Physician Assistant

## 2018-08-03 DIAGNOSIS — I10 Essential (primary) hypertension: Secondary | ICD-10-CM

## 2018-08-03 NOTE — Telephone Encounter (Signed)
Scheduled patient for the same day just different time for Welcome to medicare.

## 2018-08-05 DIAGNOSIS — M1711 Unilateral primary osteoarthritis, right knee: Secondary | ICD-10-CM | POA: Diagnosis not present

## 2018-08-05 DIAGNOSIS — Z96651 Presence of right artificial knee joint: Secondary | ICD-10-CM | POA: Diagnosis not present

## 2018-08-11 ENCOUNTER — Encounter: Payer: Self-pay | Admitting: Physician Assistant

## 2018-08-11 ENCOUNTER — Other Ambulatory Visit: Payer: Self-pay

## 2018-08-11 ENCOUNTER — Ambulatory Visit (INDEPENDENT_AMBULATORY_CARE_PROVIDER_SITE_OTHER): Payer: Medicare HMO | Admitting: Physician Assistant

## 2018-08-11 ENCOUNTER — Ambulatory Visit: Payer: Self-pay | Admitting: Physician Assistant

## 2018-08-11 VITALS — BP 130/77 | HR 68 | Temp 97.6°F | Resp 16 | Ht 66.0 in | Wt 178.2 lb

## 2018-08-11 DIAGNOSIS — Z23 Encounter for immunization: Secondary | ICD-10-CM

## 2018-08-11 DIAGNOSIS — Z0000 Encounter for general adult medical examination without abnormal findings: Secondary | ICD-10-CM | POA: Diagnosis not present

## 2018-08-11 DIAGNOSIS — Z13820 Encounter for screening for osteoporosis: Secondary | ICD-10-CM

## 2018-08-11 NOTE — Progress Notes (Signed)
Patient: Kelly Nguyen, Female    DOB: 11/16/1955, 63 y.o.   MRN: [id number] Visit Date: 08/11/2018  Today's Provider: Trinna Post, PA-C   Chief Complaint  Patient presents with  . Medicare Wellness   Subjective:     Annual wellness visit Kelly Nguyen is a 63 y.o. female. She feels well. She reports exercising. She reports she is sleeping well.  Reports she has heart issues since she was a child, has history of abnormal EKG.   Bone Density: Due Mammo: 02/22/2018 normal  Colonoscopy 02/27/2017: precancerous, recommend repeating in 5 years.  PAP: 2012, patient reported complete hysterectomy   BP Readings from Last 3 Encounters:  08/11/18 (!) 149/76  03/10/18 140/86  02/05/18 120/76    -----------------------------------------------------------   Review of Systems  Constitutional: Negative.   HENT: Negative.   Eyes: Negative.   Respiratory: Negative.   Cardiovascular: Negative.   Gastrointestinal: Negative.   Endocrine: Negative.   Genitourinary: Negative.   Musculoskeletal: Negative.   Skin: Negative.   Allergic/Immunologic: Negative.   Neurological: Negative.   Hematological: Negative.   Psychiatric/Behavioral: Negative.     Social History   Socioeconomic History  . Marital status: Married    Spouse name: Kelly Nguyen  . Number of children: 1  . Years of education: some college  . Highest education level: Not on file  Occupational History  . Occupation: Retired    Comment: from [employer]  . Financial resource strain: Not on file  . Food insecurity:    Worry: Not on file    Inability: Not on file  . Transportation needs:    Medical: Not on file    Non-medical: Not on file  Tobacco Use  . Smoking status: Never Smoker  . Smokeless tobacco: Never Used  Substance and Sexual Activity  . Alcohol use: Yes    Alcohol/week: 7.0 standard drinks    Types: 7 Glasses of wine per week  . Drug use: No  . Sexual activity: Yes    Birth  control/protection: Surgical  Lifestyle  . Physical activity:    Days per week: Not on file    Minutes per session: Not on file  . Stress: Not on file  Relationships  . Social connections:    Talks on phone: Not on file    Gets together: Not on file    Attends religious service: Not on file    Active member of club or organization: Not on file    Attends meetings of clubs or organizations: Not on file    Relationship status: Not on file  . Intimate partner violence:    Fear of current or ex partner: Not on file    Emotionally abused: Not on file    Physically abused: Not on file    Forced sexual activity: Not on file  Other Topics Concern  . Not on file  Social History Narrative  . Not on file    Past Medical History:  Diagnosis Date  . Dysrhythmia   . GERD (gastroesophageal reflux disease)   . Heart valve problem   . History of hiatal hernia   . Hypertension   . Prolapse of uterus 2012  . Stress incontinence 2012     Patient Active Problem List   Diagnosis Date Noted  . Status post total right knee replacement 05/27/2017  . Lumbar radiculopathy 03/28/2016  . S/P hysterectomy 04/10/2015  . Uterine prolapse 04/09/2015  . Closed fracture of ankle  04/09/2015  . Abnormal EKG 04/09/2015  . Pre-op evaluation 04/09/2015  . Arthralgia of multiple joints 10/12/2007  . Colon, diverticulosis 08/28/2003  . Cardiac murmur 08/28/2003  . Diaphragmatic hernia 06/22/2003  . Essential (primary) hypertension 08/17/2002    Past Surgical History:  Procedure Laterality Date  . ABDOMINAL HYSTERECTOMY    . CARDIAC CATHETERIZATION    . CHOLECYSTECTOMY    . COLONOSCOPY WITH PROPOFOL N/A 02/27/2017   Procedure: COLONOSCOPY WITH PROPOFOL;  Surgeon: Jonathon Bellows, MD;  Location: [facility] ENDOSCOPY;  Service: Gastroenterology;  Laterality: N/A;  . CYSTOCELE REPAIR N/A 04/10/2015   Procedure: ANTERIOR REPAIR (CYSTOCELE);  Surgeon: Gae Dry, MD;  Location: [facility] ORS;  Service: Gynecology;   Laterality: N/A;  . HALLUX VALGUS CORRECTION Right 2004  . HAMMER TOE SURGERY    . KNEE ARTHROPLASTY Right 05/27/2017   Procedure: COMPUTER ASSISTED TOTAL KNEE ARTHROPLASTY;  Surgeon: Dereck Leep, MD;  Location: [facility] ORS;  Service: Orthopedics;  Laterality: Right;  . ROTATOR CUFF REPAIR Right 2004  . SALPINGOOPHORECTOMY Bilateral 04/10/2015   Procedure: SALPINGO OOPHORECTOMY;  Surgeon: Gae Dry, MD;  Location: [facility] ORS;  Service: Gynecology;  Laterality: Bilateral;  vaginal  . VAGINAL HYSTERECTOMY  04/10/2015   Procedure: HYSTERECTOMY VAGINAL, ;  Surgeon: Gae Dry, MD;  Location: [facility] ORS;  Service: Gynecology;;    Her family history includes Anxiety disorder in her sister; Breast cancer (age of onset: 66) in her paternal grandmother; CAD in her father; Depression in her sister; Diabetes in an other family member; Healthy in her mother; Heart disease in her father; Hypertension in her sister; Mental illness in her mother; Migraines in her sister; Schizophrenia in her mother and sister.   Current Outpatient Medications:  .  celecoxib (CELEBREX) 200 MG capsule, TAKE 1 CAPSULE BY MOUTH EVERY DAY, Disp: 90 capsule, Rfl: 1 .  lisinopril-hydrochlorothiazide (PRINZIDE,ZESTORETIC) 10-12.5 MG tablet, TAKE 1 TABLET BY MOUTH DAILY, Disp: 90 tablet, Rfl: 0 .  Multiple Vitamins-Minerals (MULTIVITAMIN WITH MINERALS) tablet, Take 1 tablet by mouth daily., Disp: , Rfl:  .  Omeprazole Magnesium (PRILOSEC OTC PO), Take 0.25 tablets by mouth every morning. 1/4 of a tablet, Disp: , Rfl:  .  Probiotic CAPS, Take 1 capsule by mouth daily., Disp: , Rfl:   Patient Care Team: Paulene Floor as PCP - General (Physician Assistant)    Objective:    Vitals: BP (!) 149/76 (BP Location: Left Arm, Patient Position: Sitting, Cuff Size: Large)   Pulse 74   Temp 97.6 F (36.4 C) (Oral)   Resp 16   Ht 5\' 6"  (1.676 m)   Wt 178 lb 3.2 oz (80.8 kg)   BMI 28.76 kg/m   Physical  Exam Constitutional:      Appearance: Normal appearance.  HENT:     Head: Normocephalic and atraumatic.     Right Ear: Tympanic membrane, ear canal and external ear normal.     Left Ear: Tympanic membrane, ear canal and external ear normal.     Nose: Nose normal.     Mouth/Throat:     Mouth: Mucous membranes are moist.     Pharynx: Oropharynx is clear.  Eyes:     Extraocular Movements: Extraocular movements intact.     Conjunctiva/sclera: Conjunctivae normal.     Pupils: Pupils are equal, round, and reactive to light.  Neck:     Musculoskeletal: Normal range of motion and neck supple.  Cardiovascular:     Rate and Rhythm: Normal rate and regular rhythm.  Pulses: Normal pulses.     Heart sounds: Normal heart sounds.  Pulmonary:     Effort: Pulmonary effort is normal.     Breath sounds: Normal breath sounds.  Abdominal:     General: Bowel sounds are normal.  Musculoskeletal: Normal range of motion.  Skin:    General: Skin is warm.  Neurological:     Mental Status: She is alert and oriented to person, place, and time.  Psychiatric:        Mood and Affect: Mood normal.        Behavior: Behavior normal.        Thought Content: Thought content normal.        Judgment: Judgment normal.     Activities of Daily Living In your present state of health, do you have any difficulty performing the following activities: 08/11/2018 03/10/2018  Hearing? N N  Vision? N N  Difficulty concentrating or making decisions? N N  Walking or climbing stairs? N N  Dressing or bathing? N N  Doing errands, shopping? N N  Some recent data might be hidden    Fall Risk Assessment Fall Risk  03/10/2018 12/11/2016  Falls in the past year? No No     Depression Screen PHQ 2/9 Scores 08/11/2018 03/10/2018 12/11/2016  PHQ - 2 Score 0 0 0  PHQ- 9 Score - 0 -    6CIT Screen 08/11/2018  What Year? 0 points  What month? 0 points  What time? 0 points  Count back from 20 0 points  Months in reverse 0  points  Repeat phrase 0 points  Total Score 0      Assessment & Plan:     Annual Wellness Visit  Reviewed patient's Family Medical History Reviewed and updated list of patient's medical providers Assessment of cognitive impairment was done Assessed patient's functional ability Established a written schedule for health screening [place] Completed and Reviewed  Exercise Activities and Dietary recommendations Goals   None     Immunization History  Administered Date(s) Administered  . Pneumococcal Polysaccharide-23 08/11/2018  . Td 09/19/2004  . Tdap 04/25/2013    Health Maintenance  Topic Date Due  . DEXA SCAN  08/04/2018  . PNA vac Low Risk Adult (1 of 2 - PCV13) 08/04/2018  . INFLUENZA VACCINE  03/11/2019 (Originally 12/31/2017)  . MAMMOGRAM  02/20/2020  . COLONOSCOPY  02/27/2022  . TETANUS/TDAP  04/26/2023  . Hepatitis C Screening  Completed  . HIV Screening  Completed     Discussed health benefits of physical activity, and encouraged her to engage in regular exercise appropriate for her age and condition.    1. Medicare welcome exam  EKG is abnormal but comparable to prior. She does not desire cardiology referral for this.   - EKG 12-Lead  2. Encounter for screening for osteoporosis  - DG Bone Density; Future  3. Need for pneumococcal vaccination  - Pneumococcal polysaccharide vaccine 23-valent greater than or equal to 2yo subcutaneous/IM  The entirety of the information documented in the History of Present Illness, Review of Systems and Physical Exam were personally obtained by me. Portions of this information were initially documented by Lyndel Pleasure, CMA and reviewed by me for thoroughness and accuracy.   F/u 6 mo for HTN.    ------------------------------------------------------------------------------------------------------------    Trinna Post, PA-C  [facility]

## 2018-11-01 ENCOUNTER — Other Ambulatory Visit: Payer: Self-pay

## 2018-11-01 DIAGNOSIS — I10 Essential (primary) hypertension: Secondary | ICD-10-CM

## 2018-11-01 MED ORDER — LISINOPRIL-HYDROCHLOROTHIAZIDE 10-12.5 MG PO TABS: 1.0000 | ORAL_TABLET | Freq: Every day | ORAL | 1 refills | Status: DC

## 2018-11-03 ENCOUNTER — Other Ambulatory Visit: Payer: BLUE CROSS/BLUE SHIELD

## 2018-11-08 ENCOUNTER — Telehealth: Payer: Self-pay | Admitting: Physician Assistant

## 2018-11-08 NOTE — Telephone Encounter (Signed)
Kelly Nguyen with [facility] called saying Lisinopril-Hctz 10-12.5 Is on national back order.  They have to 20-25 in stock  Is it ok to use that and 1/2 the tablet  [phone]  Thanks teri

## 2018-11-08 NOTE — Telephone Encounter (Signed)
Please review

## 2018-11-08 NOTE — Telephone Encounter (Signed)
Yes the half tablet of that dose would be fine.

## 2018-11-09 NOTE — Telephone Encounter (Signed)
Pharmacy advised of the info to be changed

## 2018-11-19 DIAGNOSIS — L918 Other hypertrophic disorders of the skin: Secondary | ICD-10-CM | POA: Diagnosis not present

## 2018-11-19 DIAGNOSIS — D225 Melanocytic nevi of trunk: Secondary | ICD-10-CM | POA: Diagnosis not present

## 2018-11-19 DIAGNOSIS — D2261 Melanocytic nevi of right upper limb, including shoulder: Secondary | ICD-10-CM | POA: Diagnosis not present

## 2018-11-19 DIAGNOSIS — L821 Other seborrheic keratosis: Secondary | ICD-10-CM | POA: Diagnosis not present

## 2018-11-19 DIAGNOSIS — L538 Other specified erythematous conditions: Secondary | ICD-10-CM | POA: Diagnosis not present

## 2018-11-19 DIAGNOSIS — H53452 Other localized visual field defect, left eye: Secondary | ICD-10-CM | POA: Diagnosis not present

## 2018-11-19 DIAGNOSIS — D2262 Melanocytic nevi of left upper limb, including shoulder: Secondary | ICD-10-CM | POA: Diagnosis not present

## 2018-12-24 IMAGING — DX DG KNEE 1-2V PORT*R*
2 series · 2 of 2 positions shown · non-contrast
Comparison: None.

CLINICAL DATA: Status post total knee replacement

EXAM:
PORTABLE RIGHT KNEE - 1-2 VIEW

[knee ap]
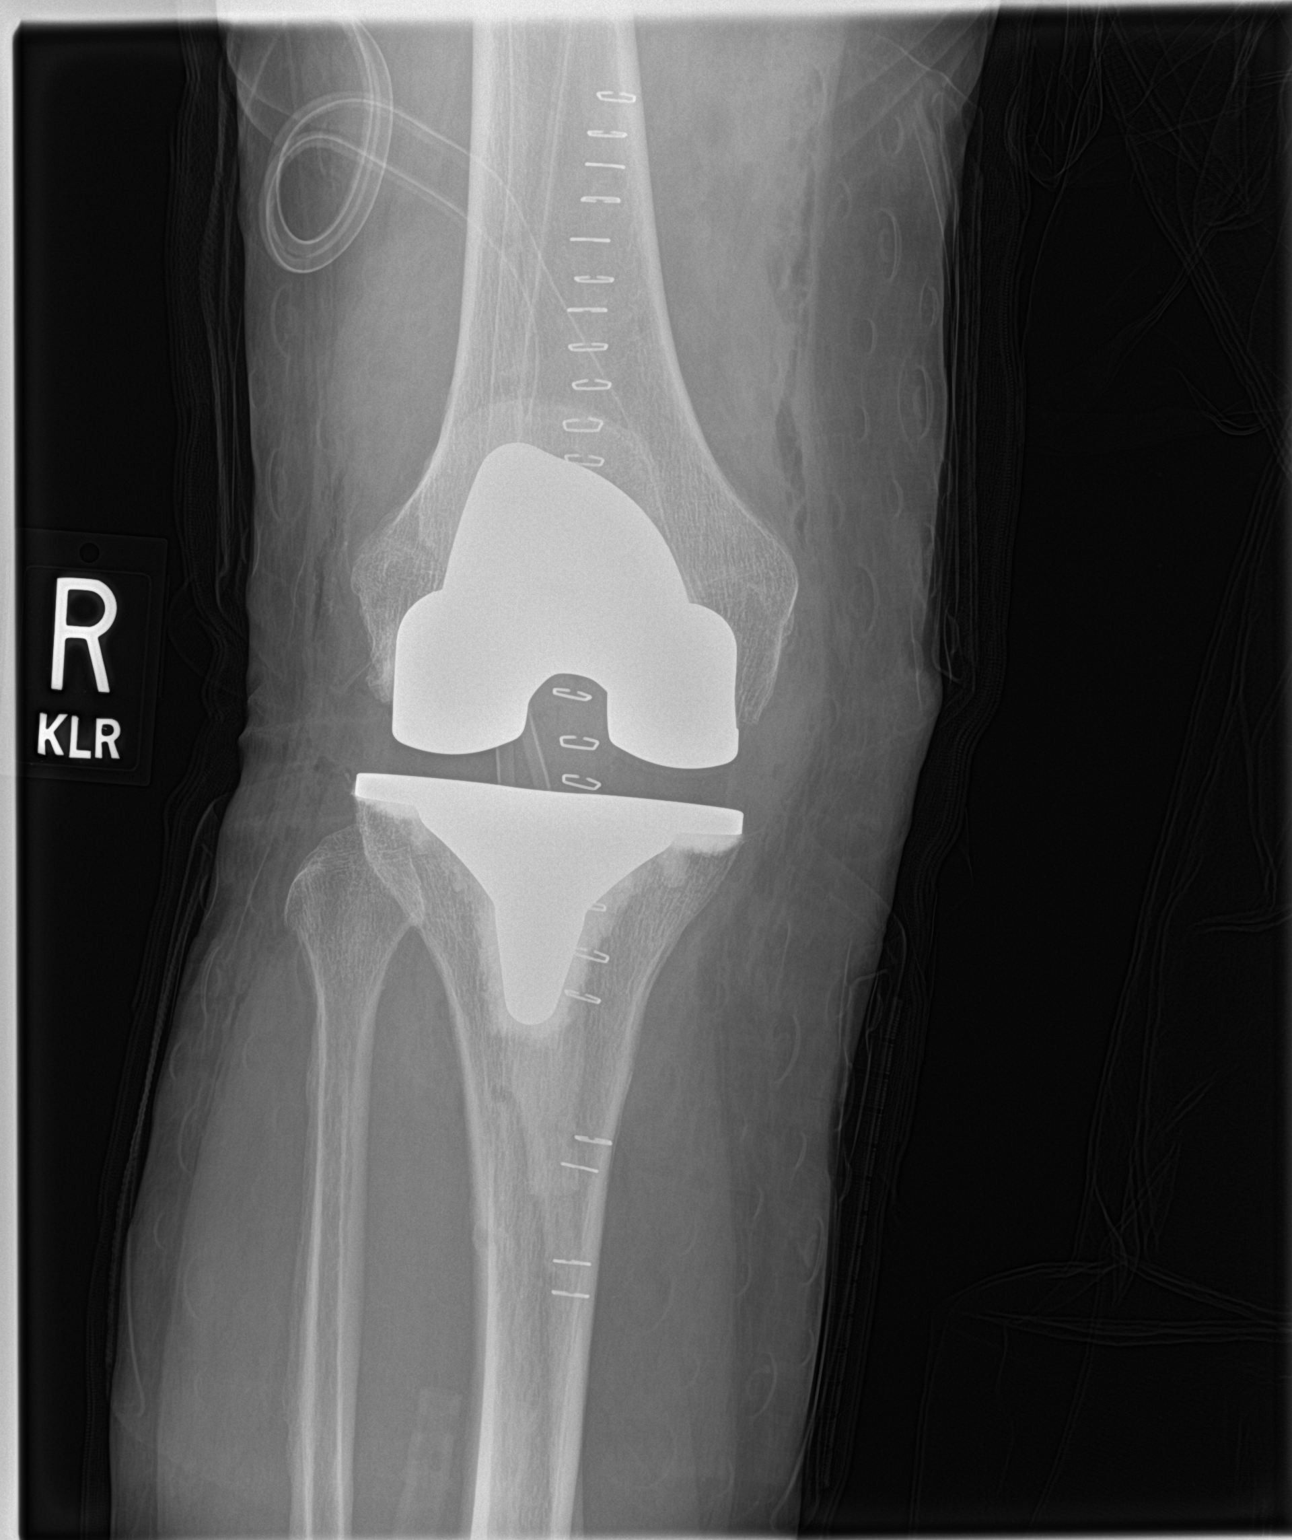

[knee lat]
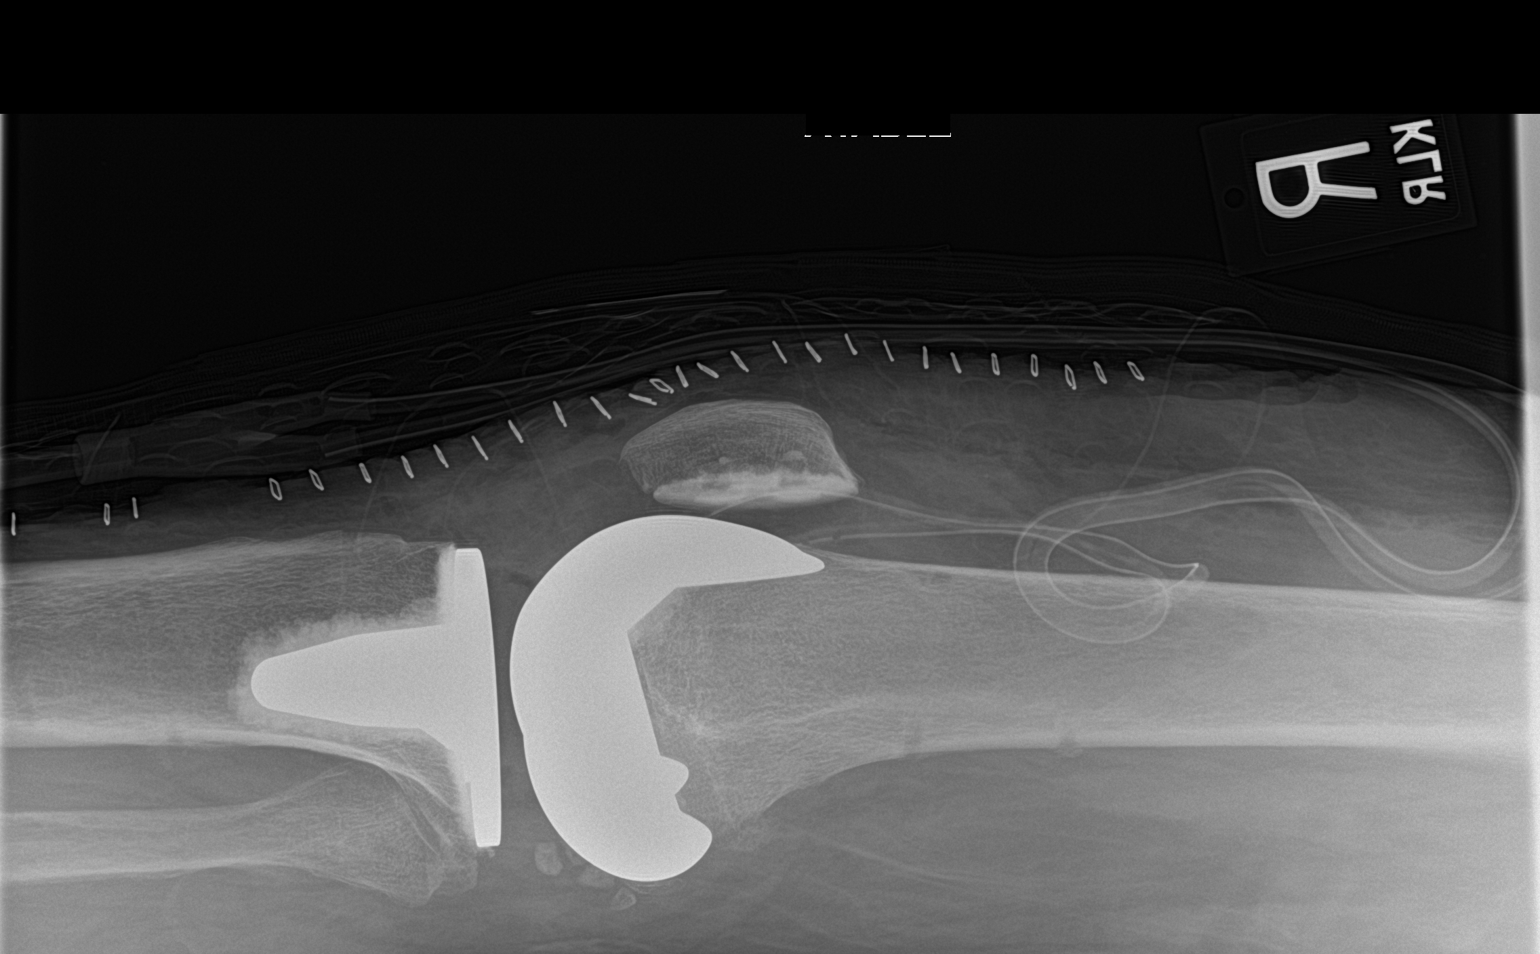

[2 of 2 positions shown; findings below may reference images not displayed]

FINDINGS: Frontal and lateral views were obtained. Patient is status post
total knee replacement with femoral and tibial prosthetic components
well-seated. No fracture or dislocation. A drain is noted in the
suprapatellar bursa. There is soft tissue fluid and air in this
area. No erosive change.
IMPRESSION: Postoperative changes. Femoral and tibial prosthetic components
well-seated. No fracture or dislocation.

## 2019-01-09 ENCOUNTER — Other Ambulatory Visit: Payer: Self-pay | Admitting: Physician Assistant

## 2019-01-09 DIAGNOSIS — M1990 Unspecified osteoarthritis, unspecified site: Secondary | ICD-10-CM

## 2019-02-08 ENCOUNTER — Ambulatory Visit: Payer: Self-pay | Admitting: Physician Assistant

## 2019-02-09 ENCOUNTER — Ambulatory Visit (INDEPENDENT_AMBULATORY_CARE_PROVIDER_SITE_OTHER): Payer: Medicare HMO | Admitting: Physician Assistant

## 2019-02-09 ENCOUNTER — Other Ambulatory Visit: Payer: Self-pay

## 2019-02-09 DIAGNOSIS — E785 Hyperlipidemia, unspecified: Secondary | ICD-10-CM

## 2019-02-09 DIAGNOSIS — Z780 Asymptomatic menopausal state: Secondary | ICD-10-CM | POA: Diagnosis not present

## 2019-02-09 DIAGNOSIS — I10 Essential (primary) hypertension: Secondary | ICD-10-CM

## 2019-02-09 NOTE — Progress Notes (Signed)
Subjective:    Patient ID: Kelly Nguyen, female    DOB: 1955/10/17, 63 y.o.   MRN: [id number]  Kelly Nguyen is a 63 y.o. female presenting on 02/09/2019 for Hypertension  Virtual Visit via Telephone Note  I connected with Layal Roacho on 02/10/19 at 11:00 AM EDT by telephone and verified that I am speaking with the correct person using two identifiers.   I discussed the limitations, risks, security and privacy concerns of performing an evaluation and management service by telephone and the availability of in person appointments. I also discussed with the patient that there may be a patient responsible charge related to this service. The patient expressed understanding and agreed to proceed.  Patient location: home Provider location: [place] office  Persons involved in the visit: patient, provider   HPI    HTN: Currently taking 10-12.5mg  zestoretic daily. She is not able to check blood pressure but is not having issues with medication.   BP Readings from Last 3 Encounters:  08/11/18 130/77  03/10/18 140/86  02/05/18 120/76    She reports she was not contacted for DEXA scan.    BP Readings from Last 3 Encounters:  08/11/18 130/77  03/10/18 140/86  02/05/18 120/76     Social History   Tobacco Use  . Smoking status: Never Smoker  . Smokeless tobacco: Never Used  Substance Use Topics  . Alcohol use: Yes    Alcohol/week: 7.0 standard drinks    Types: 7 Glasses of wine per week  . Drug use: No    Review of Systems Per HPI unless specifically indicated above     Objective:    There were no vitals taken for this visit.  Wt Readings from Last 3 Encounters:  08/11/18 178 lb 3.2 oz (80.8 kg)  03/10/18 180 lb (81.6 kg)  02/05/18 180 lb (81.6 kg)    Physical Exam Results for orders placed or performed in visit on 03/10/18  CBC with Differential/Platelet  Result Value Ref Range   WBC 3.9 3.4 - 10.8 x10E3/uL   RBC 4.10 3.77 - 5.28 x10E6/uL   Hemoglobin 11.1 11.1 - 15.9 g/dL   Hematocrit 35.4 34.0 - 46.6 %   MCV 86 79 - 97 fL   MCH 27.1 26.6 - 33.0 pg   MCHC 31.4 (L) 31.5 - 35.7 g/dL   RDW 13.5 12.3 - 15.4 %   Platelets 278 150 - 450 x10E3/uL   Neutrophils 49 Not Estab. %   Lymphs 34 Not Estab. %   Monocytes 11 Not Estab. %   Eos 4 Not Estab. %   Basos 1 Not Estab. %   Neutrophils Absolute 2.0 1.4 - 7.0 x10E3/uL   Lymphocytes Absolute 1.3 0.7 - 3.1 x10E3/uL   Monocytes Absolute 0.4 0.1 - 0.9 x10E3/uL   EOS (ABSOLUTE) 0.2 0.0 - 0.4 x10E3/uL   Basophils Absolute 0.0 0.0 - 0.2 x10E3/uL   Immature Granulocytes 1 Not Estab. %   Immature Grans (Abs) 0.0 0.0 - 0.1 x10E3/uL  Comprehensive metabolic panel  Result Value Ref Range   Glucose 95 65 - 99 mg/dL   BUN 22 8 - 27 mg/dL   Creatinine, Ser 0.83 0.57 - 1.00 mg/dL   GFR calc non Af Amer 75 >59 mL/min/1.73   GFR calc Af Amer 86 >59 mL/min/1.73   BUN/Creatinine Ratio 27 12 - 28   Sodium 142 134 - 144 mmol/L   Potassium 4.3 3.5 - 5.2 mmol/L   Chloride 103 96 - 106 mmol/L  CO2 24 20 - 29 mmol/L   Calcium 9.4 8.7 - 10.3 mg/dL   Total Protein 6.4 6.0 - 8.5 g/dL   Albumin 4.1 3.6 - 4.8 g/dL   Globulin, Total 2.3 1.5 - 4.5 g/dL   Albumin/Globulin Ratio 1.8 1.2 - 2.2   Bilirubin Total 0.4 0.0 - 1.2 mg/dL   Alkaline Phosphatase 102 39 - 117 IU/L   AST 31 0 - 40 IU/L   ALT 30 0 - 32 IU/L  TSH  Result Value Ref Range   TSH 0.911 0.450 - 4.500 uIU/mL  Lipid Panel With LDL/HDL Ratio  Result Value Ref Range   Cholesterol, Total 186 100 - 199 mg/dL   Triglycerides 87 0 - 149 mg/dL   HDL 45 >39 mg/dL   VLDL Cholesterol Cal 17 5 - 40 mg/dL   LDL Calculated 124 (H) 0 - 99 mg/dL   LDl/HDL Ratio 2.8 0.0 - 3.2 ratio  Hepatitis C antibody  Result Value Ref Range   Hep C Virus Ab <0.1 0.0 - 0.9 s/co ratio  HIV antibody (with reflex)  Result Value Ref Range   HIV Screen 4th Generation wRfx Reactive (A) Non Reactive  HIV 1/2 Ab Differentiation  Result Value Ref Range   HIV 1  Ab Negative Negative   HIV 2 Ab Negative Negative   NOTE (HIV CONF MULTIP Negative   RNA Qualitative  Result Value Ref Range   HIV 1 RNA Qualitative Negative Negative   Final Interpretation Comment       Assessment & Plan:  1. Essential (primary) hypertension  - Comprehensive Metabolic Panel (CMET) - Lipid Profile  2. Hyperlipidemia, unspecified hyperlipidemia type  - Comprehensive Metabolic Panel (CMET) - Lipid Profile  3. Postmenopausal  Reorder.   - DG Bone Density; Future   Follow up plan: Return in about 6 months (around 08/09/2019) for CPE and HTN.  Carles Collet, PA-C [facility] 02/10/2019, 2:18 PM

## 2019-02-10 NOTE — Patient Instructions (Signed)

## 2019-02-14 ENCOUNTER — Other Ambulatory Visit: Payer: Self-pay

## 2019-02-14 DIAGNOSIS — Z20822 Contact with and (suspected) exposure to covid-19: Secondary | ICD-10-CM

## 2019-02-14 DIAGNOSIS — R6889 Other general symptoms and signs: Secondary | ICD-10-CM | POA: Diagnosis not present

## 2019-02-15 ENCOUNTER — Telehealth: Payer: Self-pay

## 2019-02-15 LAB — NOVEL CORONAVIRUS, NAA: SARS-CoV-2, NAA: NOT DETECTED

## 2019-02-15 NOTE — Telephone Encounter (Signed)
Patient advised as below. Patient verbalizes understanding and is in agreement with treatment plan.  

## 2019-02-15 NOTE — Telephone Encounter (Signed)
She may go to the hospital lab if that's the case. That blood pressure is normal. Dizziness and light headedness can be caused by many things. If it was in the process of getting up, it may have been due to that. Sometimes it can be an inner ear issue. I would need more information. If it has stopped, I think she can observe it and work on getting her labwork in the hospital. Be sure to drink plenty of fluids.

## 2019-02-15 NOTE — Telephone Encounter (Signed)
Patient called to report that she was feeling dizzy, light headed this morning. Patient reports BP is at 147/68 patient took lisinopril-HCTZ this morning around 6 am. Patient reports she is still coughing and has not been able to come in for lab work, ordered on 02/10/2019. Please advise.

## 2019-02-16 ENCOUNTER — Other Ambulatory Visit
Admission: RE | Admit: 2019-02-16 | Discharge: 2019-02-16 | Disposition: A | Discharge status: Home / Self Care | Payer: Medicare HMO | Attending: Physician Assistant | Admitting: Physician Assistant

## 2019-02-16 DIAGNOSIS — I10 Essential (primary) hypertension: Secondary | ICD-10-CM | POA: Insufficient documentation

## 2019-02-16 DIAGNOSIS — E785 Hyperlipidemia, unspecified: Secondary | ICD-10-CM | POA: Diagnosis not present

## 2019-02-16 LAB — LIPID PANEL
Cholesterol: 208 mg/dL — ABNORMAL HIGH (ref 0–200)
HDL: 59 mg/dL (ref >40)
LDL Cholesterol: 128 mg/dL — ABNORMAL HIGH (ref 0–99)
Total CHOL/HDL Ratio: 3.5 RATIO
Triglycerides: 103 mg/dL (ref <150)
VLDL: 21 mg/dL (ref 0–40)

## 2019-02-16 LAB — COMPREHENSIVE METABOLIC PANEL
ALT: 40 U/L (ref 0–44)
AST: 35 U/L (ref 15–41)
Albumin: 3.9 g/dL (ref 3.5–5.0)
Alkaline Phosphatase: 80 U/L (ref 38–126)
Anion gap: 10 (ref 5–15)
BUN: 20 mg/dL (ref 8–23)
CO2: 25 mmol/L (ref 22–32)
Calcium: 9 mg/dL (ref 8.9–10.3)
Chloride: 102 mmol/L (ref 98–111)
Creatinine, Ser: 0.85 mg/dL (ref 0.44–1.00)
GFR calc Af Amer: >60 mL/min (ref >60)
GFR calc non Af Amer: >60 mL/min (ref >60)
Glucose, Bld: 110 mg/dL — ABNORMAL HIGH (ref 70–99)
Potassium: 4.2 mmol/L (ref 3.5–5.1)
Sodium: 137 mmol/L (ref 135–145)
Total Bilirubin: 0.8 mg/dL (ref 0.3–1.2)
Total Protein: 7 g/dL (ref 6.5–8.1)

## 2019-03-03 ENCOUNTER — Ambulatory Visit
Admission: RE | Admit: 2019-03-03 | Discharge: 2019-03-03 | Disposition: A | Discharge status: Home / Self Care | Payer: Medicare HMO | Source: Ambulatory Visit | Attending: Physician Assistant | Admitting: Physician Assistant

## 2019-03-03 DIAGNOSIS — M8589 Other specified disorders of bone density and structure, multiple sites: Secondary | ICD-10-CM | POA: Diagnosis not present

## 2019-03-03 DIAGNOSIS — Z780 Asymptomatic menopausal state: Secondary | ICD-10-CM | POA: Diagnosis not present

## 2019-03-10 ENCOUNTER — Other Ambulatory Visit: Payer: Self-pay

## 2019-03-10 DIAGNOSIS — Z20822 Contact with and (suspected) exposure to covid-19: Secondary | ICD-10-CM

## 2019-03-10 DIAGNOSIS — Z20828 Contact with and (suspected) exposure to other viral communicable diseases: Secondary | ICD-10-CM | POA: Diagnosis not present

## 2019-03-11 LAB — NOVEL CORONAVIRUS, NAA: SARS-CoV-2, NAA: NOT DETECTED

## 2019-03-31 DIAGNOSIS — M545 Low back pain: Secondary | ICD-10-CM | POA: Diagnosis not present

## 2019-03-31 DIAGNOSIS — M25551 Pain in right hip: Secondary | ICD-10-CM | POA: Diagnosis not present

## 2019-04-09 ENCOUNTER — Other Ambulatory Visit: Payer: Self-pay | Admitting: Physician Assistant

## 2019-04-09 DIAGNOSIS — M1990 Unspecified osteoarthritis, unspecified site: Secondary | ICD-10-CM

## 2019-04-12 NOTE — Telephone Encounter (Signed)
[place] faxed refill request for the following medications:  celecoxib (CELEBREX) 200 MG capsule   90 day supply Last Rx: 01/10/2019 LOV: 02/09/2019 Please advise. Thanks TNP

## 2019-04-14 NOTE — Telephone Encounter (Signed)
L.O.V. was on 02/09/2019.

## 2019-04-18 DIAGNOSIS — R69 Illness, unspecified: Secondary | ICD-10-CM | POA: Diagnosis not present

## 2019-05-06 ENCOUNTER — Other Ambulatory Visit: Payer: Self-pay | Admitting: Physician Assistant

## 2019-06-13 ENCOUNTER — Other Ambulatory Visit: Payer: Medicare HMO

## 2019-06-13 ENCOUNTER — Ambulatory Visit: Payer: Medicare HMO | Attending: Internal Medicine

## 2019-06-13 DIAGNOSIS — Z20822 Contact with and (suspected) exposure to covid-19: Secondary | ICD-10-CM

## 2019-06-14 LAB — NOVEL CORONAVIRUS, NAA: SARS-CoV-2, NAA: NOT DETECTED

## 2019-06-23 ENCOUNTER — Ambulatory Visit: Payer: Medicare HMO | Attending: Internal Medicine

## 2019-06-23 DIAGNOSIS — Z23 Encounter for immunization: Secondary | ICD-10-CM

## 2019-06-23 NOTE — Progress Notes (Signed)
   Covid-19 Vaccination Clinic  Name:  Kelly Nguyen    MRN: [id number] DOB: May 14, 1956  06/23/2019  Kelly Nguyen was observed post Covid-19 immunization for 15 minutes without incidence. She was provided with Vaccine Information Sheet and instruction to access the V-Safe system.   Kelly Nguyen was instructed to call 911 with any severe reactions post vaccine: Marland Kitchen Difficulty breathing  . Swelling of your face and throat  . A fast heartbeat  . A bad rash all over your body  . Dizziness and weakness    Immunizations Administered    Name Date Dose VIS Date Route   Pfizer COVID-19 Vaccine 06/23/2019  2:36 PM 0.3 mL 05/13/2019 Intramuscular   Manufacturer: [place]   Lot: [id number]   [place]: [id number]

## 2019-07-13 ENCOUNTER — Other Ambulatory Visit: Payer: Self-pay | Admitting: Physician Assistant

## 2019-07-13 DIAGNOSIS — M1990 Unspecified osteoarthritis, unspecified site: Secondary | ICD-10-CM

## 2019-07-15 ENCOUNTER — Ambulatory Visit: Payer: Medicare HMO | Attending: Internal Medicine

## 2019-07-15 DIAGNOSIS — Z23 Encounter for immunization: Secondary | ICD-10-CM

## 2019-07-15 NOTE — Progress Notes (Signed)
   Covid-19 Vaccination Clinic  Name:  Kelly Nguyen    MRN: [id number] DOB: 1955-11-28  07/15/2019  [name] was observed post Covid-19 immunization for 15 minutes without incidence. She was provided with Vaccine Information Sheet and instruction to access the V-Safe system.   [name] was instructed to call 911 with any severe reactions post vaccine: Marland Kitchen Difficulty breathing  . Swelling of your face and throat  . A fast heartbeat  . A bad rash all over your body  . Dizziness and weakness    Immunizations Administered    Name Date Dose VIS Date Route   Pfizer COVID-19 Vaccine 07/15/2019  1:09 PM 0.3 mL 05/13/2019 Intramuscular   Manufacturer: [place]   Lot: [id number]   [place]: [id number]

## 2019-08-30 DIAGNOSIS — R69 Illness, unspecified: Secondary | ICD-10-CM | POA: Diagnosis not present

## 2019-09-01 ENCOUNTER — Encounter: Payer: Self-pay | Admitting: Emergency Medicine

## 2019-09-01 ENCOUNTER — Emergency Department
Admission: EM | Admit: 2019-09-01 | Discharge: 2019-09-01 | Disposition: A | Discharge status: Home / Self Care | Payer: Medicare HMO | Attending: Emergency Medicine | Admitting: Emergency Medicine

## 2019-09-01 ENCOUNTER — Emergency Department: Payer: Medicare HMO

## 2019-09-01 ENCOUNTER — Other Ambulatory Visit: Payer: Self-pay

## 2019-09-01 DIAGNOSIS — Y92410 Unspecified street and highway as the place of occurrence of the external cause: Secondary | ICD-10-CM | POA: Insufficient documentation

## 2019-09-01 DIAGNOSIS — Y9389 Activity, other specified: Secondary | ICD-10-CM | POA: Diagnosis not present

## 2019-09-01 DIAGNOSIS — Y999 Unspecified external cause status: Secondary | ICD-10-CM | POA: Insufficient documentation

## 2019-09-01 DIAGNOSIS — S4992XA Unspecified injury of left shoulder and upper arm, initial encounter: Secondary | ICD-10-CM | POA: Diagnosis not present

## 2019-09-01 DIAGNOSIS — I10 Essential (primary) hypertension: Secondary | ICD-10-CM | POA: Insufficient documentation

## 2019-09-01 DIAGNOSIS — Z96651 Presence of right artificial knee joint: Secondary | ICD-10-CM | POA: Diagnosis not present

## 2019-09-01 DIAGNOSIS — M79622 Pain in left upper arm: Secondary | ICD-10-CM | POA: Diagnosis not present

## 2019-09-01 MED ORDER — METAXALONE 800 MG PO TABS: 800.0000 mg | ORAL_TABLET | Freq: Three times a day (TID) | ORAL | 0 refills | Status: AC | PRN
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
## 2019-09-01 NOTE — Discharge Instructions (Signed)
Please continue your celebrex as prescribed.   You may also take the Skelaxin if needed for muscle tightness or spasm. Be aware that it may cause drowsiness.  Follow up with your primary care provider if not improving over the week.  Return to the ER for symptoms that change or worsen if unable to schedule an appointment.
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
## 2019-09-08 DIAGNOSIS — S4992XA Unspecified injury of left shoulder and upper arm, initial encounter: Secondary | ICD-10-CM | POA: Diagnosis not present

## 2019-09-20 NOTE — Progress Notes (Signed)
Complete physical exam    Patient: Kelly Nguyen   DOB: December 25, 1955   64 y.o. Female  MRN: [id number] Visit Date: 09/27/2019  Today's healthcare provider: Trinna Post, PA-C  Subjective:    Chief Complaint  Patient presents with  . Annual Exam  AWE was w/ Kelly Nguyen today,09/26/2019. Kelly Nguyen is a 64 y.o. female who presents today for a complete physical exam.  She reports consuming a general diet. Pt exercises regularly She generally feels fairly well. She reports sleeping well. She does have additional problems to discuss today.  HPI   Colonoscopy: Due in 2023 due to two polyps. Mammogram: Due 02/2020 New grand baby, this is her second grandchild.  Daughter: just got engaged, married in [place]  Hypertension, follow-up  BP Readings from Last 3 Encounters:  09/26/19 116/74  09/01/19 120/70  08/11/18 130/77   Wt Readings from Last 3 Encounters:  09/26/19 177 lb (80.3 kg)  09/01/19 175 lb (79.4 kg)  08/11/18 178 lb 3.2 oz (80.8 kg)     She was last seen for hypertension 6 months ago.  BP at that visit was normal. Management since that visit includes continue lisinopril-hctz 10-12.5mg  daily. This medicine was on backorder so she has been cutting a 20-25 mg pill in half.   She reports excellent compliance with treatment. She is not having side effects.  She is following a Regular diet. She is exercising. She does not smoke.  Use of agents associated with hypertension: none.   Outside blood pressures are not checked.  Symptoms:  YES NO    []    [x]    Chest Pain   []    [x]    Chest pressure/discomfort   []    [x]    Palpitations   []    [x]    Dyspnea   []    [x]    Orthopnea   []    [x]    Paroxysmal nocturnal dyspnea   []   [x]    Lower extremity edema   []    [x]   Syncope   Pertinent labs: Lab Results  Component Value Date   CHOL 193 09/26/2019   HDL 56 09/26/2019   LDLCALC 125 (H) 09/26/2019   TRIG 66 09/26/2019   CHOLHDL 3.4 09/26/2019   Lab Results  Component  Value Date   NA 141 09/26/2019   K 4.2 09/26/2019   CO2 24 09/26/2019   GLUCOSE 93 09/26/2019   BUN 23 09/26/2019   CREATININE 0.80 09/26/2019   CALCIUM 9.7 09/26/2019   GFRNONAA 77 09/26/2019   GFRAA 89 09/26/2019     The 10-year ASCVD risk score Kelly Nguyen DC Jr., et al., 2013) is: 6.7%   ---------------------------------------------------------------------------------------------------   Past Medical History:  Diagnosis Date  . Dysrhythmia   . GERD (gastroesophageal reflux disease)   . Heart valve problem   . History of hiatal hernia   . Hypertension   . Prolapse of uterus 2012  . Stress incontinence 2012   Past Surgical History:  Procedure Laterality Date  . ABDOMINAL HYSTERECTOMY    . CARDIAC CATHETERIZATION    . CHOLECYSTECTOMY    . COLONOSCOPY WITH PROPOFOL N/A 02/27/2017   Procedure: COLONOSCOPY WITH PROPOFOL;  Surgeon: Kelly Bellows, MD;  Location: [facility] ENDOSCOPY;  Service: Gastroenterology;  Laterality: N/A;  . CYSTOCELE REPAIR N/A 04/10/2015   Procedure: ANTERIOR REPAIR (CYSTOCELE);  Surgeon: Kelly Dry, MD;  Location: [facility] ORS;  Service: Gynecology;  Laterality: N/A;  . HALLUX VALGUS CORRECTION Right 2004  . HAMMER TOE SURGERY    .  KNEE ARTHROPLASTY Right 05/27/2017   Procedure: COMPUTER ASSISTED TOTAL KNEE ARTHROPLASTY;  Surgeon: Kelly Leep, MD;  Location: [facility] ORS;  Service: Orthopedics;  Laterality: Right;  . ROTATOR CUFF REPAIR Right 2004  . SALPINGOOPHORECTOMY Bilateral 04/10/2015   Procedure: SALPINGO OOPHORECTOMY;  Surgeon: Kelly Dry, MD;  Location: [facility] ORS;  Service: Gynecology;  Laterality: Bilateral;  vaginal  . VAGINAL HYSTERECTOMY  04/10/2015   Procedure: HYSTERECTOMY VAGINAL, ;  Surgeon: Kelly Dry, MD;  Location: [facility] ORS;  Service: Gynecology;;   Social History   Socioeconomic History  . Marital status: Married    Spouse name: Kelly Nguyen  . Number of children: 1  . Years of education: some college  . Highest education level: Some  college, no degree  Occupational History  . Occupation: Retired    Comment: from [employer]  . Smoking status: Never Smoker  . Smokeless tobacco: Never Used  Substance and Sexual Activity  . Alcohol use: Yes    Alcohol/week: 6.0 standard drinks    Types: 6 Glasses of wine per week    Comment: a couple galsses a couple nights a week  . Drug use: No  . Sexual activity: Yes    Birth control/protection: Surgical  Other Topics Concern  . Not on file  Social History Narrative  . Not on file   Social Determinants of Health   Financial Resource Strain: Low Risk   . Difficulty of Paying Living Expenses: Not hard at all  Food Insecurity: No Food Insecurity  . Worried About Charity fundraiser in the Last Year: Never true  . Ran Out of Food in the Last Year: Never true  Transportation Needs: No Transportation Needs  . Lack of Transportation (Medical): No  . Lack of Transportation (Non-Medical): No  Physical Activity: Insufficiently Active  . Days of Exercise per Week: 7 days  . Minutes of Exercise per Session: 20 min  Stress: No Stress Concern Present  . Feeling of Stress : Not at all  Social Connections: Somewhat Isolated  . Frequency of Communication with Friends and Family: More than three times a week  . Frequency of Social Gatherings with Friends and Family: Twice a week  . Attends Religious Services: Never  . Active Member of Clubs or Organizations: No  . Attends Archivist Meetings: Never  . Marital Status: Married  Human resources officer Violence: Not At Risk  . Fear of Current or Ex-Partner: No  . Emotionally Abused: No  . Physically Abused: No  . Sexually Abused: No   Family Status  Relation Name Status  . Mother  Deceased  . Father  Deceased at age 73  . Sister  Alive  . Other Maternal Side (Not Specified)  . PGM  (Not Specified)   Family History  Problem Relation Age of Onset  . Healthy Mother   . Mental illness Mother   . Schizophrenia  Mother   . Heart disease Father   . CAD Father   . Depression Sister   . Migraines Sister   . Anxiety disorder Sister   . Hypertension Sister   . Schizophrenia Sister   . Diabetes Other   . Breast cancer Paternal Grandmother 58   Allergies  Allergen Reactions  . Acyclovir And Related   . Meloxicam Rash    Patient Care Team: Paulene Floor as PCP - General (Physician Assistant) Marry Guan Laurice Record, MD (Orthopedic Surgery) Arelia Sneddon, OD (Optometry)   Medications: Outpatient Medications  Prior to Visit  Medication Sig  . cholecalciferol (VITAMIN D) 25 MCG (1000 UNIT) tablet Take 1,000 Units by mouth daily. + K2 45 mcg  . COLLAGEN PO Take 6,000 mg by mouth daily. + Biotin  . lisinopril-hydrochlorothiazide (ZESTORETIC) 20-25 MG tablet TAKE 1/2 TABLET DAILY  . Multiple Vitamins-Minerals (MULTIVITAMIN WITH MINERALS) tablet Take 1 tablet by mouth daily.  . Omeprazole Magnesium (PRILOSEC OTC PO) Take 0.25 tablets by mouth every morning. 1/4 of a tablet  . Probiotic CAPS Take 1 capsule by mouth daily.  . vitamin B-12 (CYANOCOBALAMIN) 500 MCG tablet Take 500 mcg by mouth daily.  . [DISCONTINUED] celecoxib (CELEBREX) 200 MG capsule TAKE ONE CAPSULE BY MOUTH DAILY  . metaxalone (SKELAXIN) 800 MG tablet Take 1 tablet (800 mg total) by mouth 3 (three) times daily as needed for muscle spasms. (Patient not taking: Reported on 09/26/2019)  . [DISCONTINUED] lisinopril-hydrochlorothiazide (ZESTORETIC) 10-12.5 MG tablet Take 1 tablet by mouth daily. (Patient not taking: Reported on 09/26/2019)   No facility-administered medications prior to visit.    Review of Systems   12 point ROS reviewed and is negative except HPI      Objective:    BP 116/74 (BP Location: Left Arm, Patient Position: Sitting, Cuff Size: Normal)   Pulse 61   Temp (!) 96.9 F (36.1 C) (Temporal)   Wt 177 lb (80.3 kg)   SpO2 98%   BMI 28.57 kg/m    Physical Exam Constitutional:      Appearance: Normal  appearance.  HENT:     Right Ear: Tympanic membrane and ear canal normal.     Left Ear: Tympanic membrane and ear canal normal.  Cardiovascular:     Rate and Rhythm: Normal rate and regular rhythm.     Heart sounds: Normal heart sounds.  Pulmonary:     Effort: Pulmonary effort is normal.     Breath sounds: Normal breath sounds.  Abdominal:     General: Bowel sounds are normal.     Palpations: Abdomen is soft.  Musculoskeletal:     Cervical back: Normal range of motion and neck supple.  Lymphadenopathy:     Cervical: No cervical adenopathy.  Skin:    General: Skin is warm and Nguyen.  Neurological:     Mental Status: She is alert and oriented to person, place, and time. Mental status is at baseline.  Psychiatric:        Mood and Affect: Mood normal.        Behavior: Behavior normal.       Depression Screen  PHQ 2/9 Scores 09/26/2019 08/11/2018 03/10/2018  PHQ - 2 Score 0 0 0  PHQ- 9 Score - - 0    Results for orders placed or performed in visit on 09/26/19  TSH  Result Value Ref Range   TSH 1.370 0.450 - 4.500 uIU/mL  Lipid panel  Result Value Ref Range   Cholesterol, Total 193 100 - 199 mg/dL   Triglycerides 66 0 - 149 mg/dL   HDL 56 >39 mg/dL   VLDL Cholesterol Cal 12 5 - 40 mg/dL   LDL Chol Calc (NIH) 125 (H) 0 - 99 mg/dL   Chol/HDL Ratio 3.4 0.0 - 4.4 ratio  Comprehensive metabolic panel  Result Value Ref Range   Glucose 93 65 - 99 mg/dL   BUN 23 8 - 27 mg/dL   Creatinine, Ser 0.80 0.57 - 1.00 mg/dL   GFR calc non Af Amer 77 >59 mL/min/1.73   GFR calc Af Wyvonnia Lora  89 >59 mL/min/1.73   BUN/Creatinine Ratio 29 (H) 12 - 28   Sodium 141 134 - 144 mmol/L   Potassium 4.2 3.5 - 5.2 mmol/L   Chloride 102 96 - 106 mmol/L   CO2 24 20 - 29 mmol/L   Calcium 9.7 8.7 - 10.3 mg/dL   Total Protein 6.9 6.0 - 8.5 g/dL   Albumin 4.5 3.8 - 4.8 g/dL   Globulin, Total 2.4 1.5 - 4.5 g/dL   Albumin/Globulin Ratio 1.9 1.2 - 2.2   Bilirubin Total 0.3 0.0 - 1.2 mg/dL   Alkaline  Phosphatase 85 39 - 117 IU/L   AST 32 0 - 40 IU/L   ALT 38 (H) 0 - 32 IU/L  CBC with Differential/Platelet  Result Value Ref Range   WBC 4.7 3.4 - 10.8 x10E3/uL   RBC 4.25 3.77 - 5.28 x10E6/uL   Hemoglobin 13.1 11.1 - 15.9 g/dL   Hematocrit 39.2 34.0 - 46.6 %   MCV 92 79 - 97 fL   MCH 30.8 26.6 - 33.0 pg   MCHC 33.4 31.5 - 35.7 g/dL   RDW 12.4 11.7 - 15.4 %   Platelets 246 150 - 450 x10E3/uL   Neutrophils 56 Not Estab. %   Lymphs 31 Not Estab. %   Monocytes 9 Not Estab. %   Eos 3 Not Estab. %   Basos 1 Not Estab. %   Neutrophils Absolute 2.7 1.4 - 7.0 x10E3/uL   Lymphocytes Absolute 1.5 0.7 - 3.1 x10E3/uL   Monocytes Absolute 0.4 0.1 - 0.9 x10E3/uL   EOS (ABSOLUTE) 0.1 0.0 - 0.4 x10E3/uL   Basophils Absolute 0.0 0.0 - 0.2 x10E3/uL   Immature Granulocytes 0 Not Estab. %   Immature Grans (Abs) 0.0 0.0 - 0.1 x10E3/uL      Assessment & Plan:    Routine Health Maintenance and Physical Exam  Exercise Activities and Dietary recommendations Goals    . DIET - INCREASE WATER INTAKE     Recommend to drink at least 6-8 8oz glasses of water per day.       Immunization History  Administered Date(s) Administered  . Influenza-Unspecified 04/18/2019  . PFIZER SARS-COV-2 Vaccination 06/23/2019, 07/15/2019  . Pneumococcal Conjugate-13 09/26/2019  . Pneumococcal Polysaccharide-23 08/11/2018  . Td 09/19/2004  . Tdap 04/25/2013    Health Maintenance  Topic Date Due  . INFLUENZA VACCINE  01/01/2020  . MAMMOGRAM  02/20/2020  . COLONOSCOPY  02/27/2022  . TETANUS/TDAP  04/26/2023  . DEXA SCAN  03/02/2024  . COVID-19 Vaccine  Completed  . Hepatitis C Screening  Completed  . PNA vac Low Risk Adult  Completed    Discussed health benefits of physical activity, and encouraged her to engage in regular exercise appropriate for her age and condition.  1. Annual physical exam  - TSH - Lipid panel - Comprehensive metabolic panel - CBC with Differential/Platelet  2. Arthritis  -  celecoxib (CELEBREX) 200 MG capsule; Take 1 capsule (200 mg total) by mouth daily.  Dispense: 90 capsule; Refill: 1  3. Essential (primary) hypertension  Well controlled Continue current medications Recheck metabolic panel F/u in 6 months  - lisinopril-hydrochlorothiazide (ZESTORETIC) 10-12.5 MG tablet; Take 1 tablet by mouth daily.  Dispense: 90 tablet; Refill: 1  4. Pseudopolyposis of colon without complication, unspecified part of colon ([place])   5. Encounter for screening mammogram for malignant neoplasm of breast  - MM Digital Diagnostic Bilat; Future  6. Need for pneumococcal vaccination  - Pneumococcal conjugate vaccine 13-valent IM  Return in about 6 months (around 03/27/2020).     ITrinna Post, PA-C, have reviewed all documentation for this visit. The documentation on 09/27/19 for the exam, diagnosis, procedures, and orders are all accurate and complete.  The entirety of the information documented in the History of Present Illness, Review of Systems and Physical Exam were personally obtained by me. Portions of this information were initially documented by [facility] and reviewed by me for thoroughness and accuracy.     Paulene Floor  [facility] [phone] (phone) [phone] (fax)  [place]

## 2019-09-22 NOTE — Progress Notes (Signed)
Subjective:   Kelly Nguyen is a 64 y.o. female who presents for an Initial Medicare Annual Wellness Visit.    This visit is being conducted through telemedicine due to the COVID-19 pandemic. This patient has given me verbal consent via doximity to conduct this visit, patient states they are participating from their home address. Some vital signs may be absent or patient reported.    Patient identification: identified by name, DOB, and current address  Review of Systems    N/A  Cardiac Risk Factors include: advanced age (>79men, >46 women);hypertension     Objective:    Today's Vitals   09/26/19 1030  PainSc: 0-No pain   There is no height or weight on file to calculate BMI. Unable to obtain vitals due to visit being conducted via telephonically.   Advanced Directives 09/26/2019 09/01/2019 05/27/2017 05/13/2017 02/27/2017 12/11/2016 04/05/2015  Does Patient Have a Medical Advance Directive? Yes No Yes Yes No Yes Yes  Type of Paramedic of [place];Living will - Living will [place];Living will - Living will;Healthcare Power of Attorney -  Does patient want to make changes to medical advance directive? - - No - Patient declined - - - Yes - information given  Copy of [place] in Chart? No - copy requested - No - copy requested No - copy requested - - No - copy requested  Would patient like information on creating a medical advance directive? - No - Patient declined - - - - -    Current Medications (verified) Outpatient Encounter Medications as of 09/26/2019  Medication Sig  . celecoxib (CELEBREX) 200 MG capsule TAKE ONE CAPSULE BY MOUTH DAILY  . cholecalciferol (VITAMIN D) 25 MCG (1000 UNIT) tablet Take 1,000 Units by mouth daily. + K2 45 mcg  . COLLAGEN PO Take 6,000 mg by mouth daily. + Biotin  . lisinopril-hydrochlorothiazide (ZESTORETIC) 20-25 MG tablet TAKE 1/2 TABLET DAILY  . Multiple Vitamins-Minerals (MULTIVITAMIN  WITH MINERALS) tablet Take 1 tablet by mouth daily.  . Omeprazole Magnesium (PRILOSEC OTC PO) Take 0.25 tablets by mouth every morning. 1/4 of a tablet  . Probiotic CAPS Take 1 capsule by mouth daily.  . vitamin B-12 (CYANOCOBALAMIN) 500 MCG tablet Take 500 mcg by mouth daily.  Marland Kitchen lisinopril-hydrochlorothiazide (ZESTORETIC) 10-12.5 MG tablet Take 1 tablet by mouth daily. (Patient not taking: Reported on 09/26/2019)  . metaxalone (SKELAXIN) 800 MG tablet Take 1 tablet (800 mg total) by mouth 3 (three) times daily as needed for muscle spasms. (Patient not taking: Reported on 09/26/2019)   No facility-administered encounter medications on file as of 09/26/2019.    Allergies (verified) Acyclovir and related and Meloxicam   History: Past Medical History:  Diagnosis Date  . Dysrhythmia   . GERD (gastroesophageal reflux disease)   . Heart valve problem   . History of hiatal hernia   . Hypertension   . Prolapse of uterus 2012  . Stress incontinence 2012   Past Surgical History:  Procedure Laterality Date  . ABDOMINAL HYSTERECTOMY    . CARDIAC CATHETERIZATION    . CHOLECYSTECTOMY    . COLONOSCOPY WITH PROPOFOL N/A 02/27/2017   Procedure: COLONOSCOPY WITH PROPOFOL;  Surgeon: Jonathon Bellows, MD;  Location: [facility] ENDOSCOPY;  Service: Gastroenterology;  Laterality: N/A;  . CYSTOCELE REPAIR N/A 04/10/2015   Procedure: ANTERIOR REPAIR (CYSTOCELE);  Surgeon: Gae Dry, MD;  Location: [facility] ORS;  Service: Gynecology;  Laterality: N/A;  . HALLUX VALGUS CORRECTION Right 2004  . HAMMER  TOE SURGERY    . KNEE ARTHROPLASTY Right 05/27/2017   Procedure: COMPUTER ASSISTED TOTAL KNEE ARTHROPLASTY;  Surgeon: Dereck Leep, MD;  Location: [facility] ORS;  Service: Orthopedics;  Laterality: Right;  . ROTATOR CUFF REPAIR Right 2004  . SALPINGOOPHORECTOMY Bilateral 04/10/2015   Procedure: SALPINGO OOPHORECTOMY;  Surgeon: Gae Dry, MD;  Location: [facility] ORS;  Service: Gynecology;  Laterality: Bilateral;  vaginal    . VAGINAL HYSTERECTOMY  04/10/2015   Procedure: HYSTERECTOMY VAGINAL, ;  Surgeon: Gae Dry, MD;  Location: [facility] ORS;  Service: Gynecology;;   Family History  Problem Relation Age of Onset  . Healthy Mother   . Mental illness Mother   . Schizophrenia Mother   . Heart disease Father   . CAD Father   . Depression Sister   . Migraines Sister   . Anxiety disorder Sister   . Hypertension Sister   . Schizophrenia Sister   . Diabetes Other   . Breast cancer Paternal Grandmother 81   Social History   Socioeconomic History  . Marital status: Married    Spouse name: Broadus John  . Number of children: 1  . Years of education: some college  . Highest education level: Some college, no degree  Occupational History  . Occupation: Retired    Comment: from [employer]  . Smoking status: Never Smoker  . Smokeless tobacco: Never Used  Substance and Sexual Activity  . Alcohol use: Yes    Alcohol/week: 6.0 standard drinks    Types: 6 Glasses of wine per week    Comment: a couple galsses a couple nights a week  . Drug use: No  . Sexual activity: Yes    Birth control/protection: Surgical  Other Topics Concern  . Not on file  Social History Narrative  . Not on file   Social Determinants of Health   Financial Resource Strain: Low Risk   . Difficulty of Paying Living Expenses: Not hard at all  Food Insecurity: No Food Insecurity  . Worried About Charity fundraiser in the Last Year: Never true  . Ran Out of Food in the Last Year: Never true  Transportation Needs: No Transportation Needs  . Lack of Transportation (Medical): No  . Lack of Transportation (Non-Medical): No  Physical Activity: Insufficiently Active  . Days of Exercise per Week: 7 days  . Minutes of Exercise per Session: 20 min  Stress: No Stress Concern Present  . Feeling of Stress : Not at all  Social Connections: Somewhat Isolated  . Frequency of Communication with Friends and Family: More than three  times a week  . Frequency of Social Gatherings with Friends and Family: Twice a week  . Attends Religious Services: Never  . Active Member of Clubs or Organizations: No  . Attends Archivist Meetings: Never  . Marital Status: Married    Tobacco Counseling Counseling given: Not Answered   Clinical Intake:  Pre-visit preparation completed: Yes  Pain : No/denies pain Pain Score: 0-No pain     Nutritional Risks: None Diabetes: No  How often do you need to have someone help you when you read instructions, pamphlets, or other written materials from your doctor or pharmacy?: 1 - Never  Interpreter Needed?: No  Information entered by :: [facility], LPN   Activities of Daily Living In your present state of health, do you have any difficulty performing the following activities: 09/26/2019  Hearing? N  Vision? N  Difficulty concentrating or making decisions?  N  Walking or climbing stairs? N  Dressing or bathing? N  Doing errands, shopping? N  Preparing Food and eating ? N  Using the Toilet? N  In the past six months, have you accidently leaked urine? Y  Comment Occasionally with urges.  Do you have problems with loss of bowel control? N  Managing your Medications? N  Managing your Finances? N  Housekeeping or managing your Housekeeping? N  Some recent data might be hidden     Immunizations and Health Maintenance Immunization History  Administered Date(s) Administered  . Influenza-Unspecified 04/18/2019  . PFIZER SARS-COV-2 Vaccination 06/23/2019, 07/15/2019  . Pneumococcal Polysaccharide-23 08/11/2018  . Td 09/19/2004  . Tdap 04/25/2013   Health Maintenance Due  Topic Date Due  . PNA vac Low Risk Adult (2 of 2 - PCV13) 08/11/2019    Patient Care Team: Paulene Floor as PCP - General (Physician Assistant) Marry Guan Laurice Record, MD (Orthopedic Surgery) Arelia Sneddon, OD (Optometry)  Indicate any recent Medical Services you may have received from  other than [facility] providers in the past year (date may be approximate).     Assessment:   This is a routine wellness examination for Martia.  Hearing/Vision screen No exam data present  Dietary issues and exercise activities discussed: Current Exercise Habits: Home exercise routine, Type of exercise: walking;stretching, Time (Minutes): 20, Frequency (Times/Week): 7, Weekly Exercise (Minutes/Week): 140, Intensity: Mild, Exercise limited by: None identified  Goals    . DIET - INCREASE WATER INTAKE     Recommend to drink at least 6-8 8oz glasses of water per day.      Depression Screen PHQ 2/9 Scores 09/26/2019 08/11/2018 03/10/2018 12/11/2016  PHQ - 2 Score 0 0 0 0  PHQ- 9 Score - - 0 -    Fall Risk Fall Risk  09/26/2019 03/10/2018 12/11/2016  Falls in the past year? 0 No No  Number falls in past yr: 0 - -  Injury with Fall? 0 - -   FALL RISK PREVENTION PERTAINING TO THE HOME:  Any stairs in or around the home? Yes  If so, are there any without handrails? No   Home free of loose throw rugs in walkways, pet beds, electrical cords, etc? Yes  Adequate lighting in your home to reduce risk of falls? Yes   ASSISTIVE DEVICES UTILIZED TO PREVENT FALLS:  Life alert? No  Use of a cane, walker or w/c? No  Grab bars in the bathroom? No  Shower chair or bench in shower? Yes  Elevated toilet seat or a handicapped toilet? Yes    TIMED UP AND GO:  Was the test performed? No .     Cognitive Function: Declined today.     6CIT Screen 08/11/2018  What Year? 0 points  What month? 0 points  What time? 0 points  Count back from 20 0 points  Months in reverse 0 points  Repeat phrase 0 points  Total Score 0    Screening Tests Health Maintenance  Topic Date Due  . PNA vac Low Risk Adult (2 of 2 - PCV13) 08/11/2019  . INFLUENZA VACCINE  01/01/2020  . MAMMOGRAM  02/20/2020  . COLONOSCOPY  02/27/2022  . TETANUS/TDAP  04/26/2023  . DEXA SCAN  03/02/2024  . COVID-19 Vaccine  Completed   . Hepatitis C Screening  Completed    Qualifies for Shingles Vaccine? Yes . Due for Shingrix. Pt has been advised to call insurance company to determine out of pocket expense. Advised may  also receive vaccine at local pharmacy or Health Dept. Verbalized acceptance and understanding.  Tdap: Up to date  Flu Vaccine: Up to date  Pneumococcal Vaccine: Due for Pneumococcal vaccine. Does the patient want to receive this vaccine today?  No . Advised may receive this vaccine at local pharmacy or Health Dept. Aware to provide a copy of the vaccination record if obtained from local pharmacy or Health Dept. Verbalized acceptance and understanding.   Cancer Screenings:  Colorectal Screening: Completed 02/27/17. Repeat every 5 years.   Mammogram: Completed 02/19/18. Repeat every 1-2 years as advised.  Bone Density: Completed 03/03/19. Results reflect OSTEOPENIA. Repeat every 5 years.   Lung Cancer Screening: (Low Dose CT Chest recommended if Age 15-80 years, 30 pack-year currently smoking OR have quit w/in 15years.) does not qualify.   Additional Screening:  Hepatitis C Screening: Up to date  Vision Screening: Recommended annual ophthalmology exams for early detection of glaucoma and other disorders of the eye.  Dental Screening: Recommended annual dental exams for proper oral hygiene  Community Resource Referral:  CRR required this visit?  No       Plan:  I have personally reviewed and addressed the Medicare Annual Wellness questionnaire and have noted the following in the patient's chart:  A. Medical and social history B. Use of alcohol, tobacco or illicit drugs  C. Current medications and supplements D. Functional ability and status E.  Nutritional status F.  Physical activity G. Advance directives H. List of other physicians I.  Hospitalizations, surgeries, and ER visits in previous 12 months J.  [place] such as hearing and vision if needed, cognitive and  depression L. Referrals and appointments   In addition, I have reviewed and discussed with patient certain preventive protocols, quality metrics, and best practice recommendations. A written personalized care plan for preventive services as well as general preventive health recommendations were provided to patient.    [place]ora Score, [place]   [id number]  Nurse Health Advisor   Nurse Notes: Pt to receive the Prevnar 13 vaccine at today's in office apt.

## 2019-09-26 ENCOUNTER — Ambulatory Visit: Payer: Medicare HMO

## 2019-09-26 ENCOUNTER — Ambulatory Visit (INDEPENDENT_AMBULATORY_CARE_PROVIDER_SITE_OTHER): Payer: Medicare HMO

## 2019-09-26 ENCOUNTER — Encounter: Payer: Self-pay | Admitting: Physician Assistant

## 2019-09-26 ENCOUNTER — Ambulatory Visit (INDEPENDENT_AMBULATORY_CARE_PROVIDER_SITE_OTHER): Payer: Medicare HMO | Admitting: Physician Assistant

## 2019-09-26 ENCOUNTER — Other Ambulatory Visit: Payer: Self-pay

## 2019-09-26 VITALS — BP 116/74 | HR 61 | Temp 96.9°F | Wt 177.0 lb

## 2019-09-26 DIAGNOSIS — H5213 Myopia, bilateral: Secondary | ICD-10-CM | POA: Diagnosis not present

## 2019-09-26 DIAGNOSIS — Z0000 Encounter for general adult medical examination without abnormal findings: Secondary | ICD-10-CM

## 2019-09-26 DIAGNOSIS — K5140 Inflammatory polyps of colon without complications: Secondary | ICD-10-CM | POA: Insufficient documentation

## 2019-09-26 DIAGNOSIS — I10 Essential (primary) hypertension: Secondary | ICD-10-CM

## 2019-09-26 DIAGNOSIS — Z1231 Encounter for screening mammogram for malignant neoplasm of breast: Secondary | ICD-10-CM

## 2019-09-26 DIAGNOSIS — Z23 Encounter for immunization: Secondary | ICD-10-CM

## 2019-09-26 DIAGNOSIS — M1990 Unspecified osteoarthritis, unspecified site: Secondary | ICD-10-CM

## 2019-09-26 MED ORDER — CELECOXIB 200 MG PO CAPS: 200.0000 mg | ORAL_CAPSULE | Freq: Every day | ORAL | 1 refills | Status: DC

## 2019-09-26 MED ORDER — LISINOPRIL-HYDROCHLOROTHIAZIDE 10-12.5 MG PO TABS: 1.0000 | ORAL_TABLET | Freq: Every day | ORAL | 1 refills | Status: DC

## 2019-09-26 NOTE — Patient Instructions (Signed)
Health Maintenance, Female Adopting a healthy lifestyle and getting preventive care are important in promoting health and wellness. Ask your health care provider about:  The right schedule for you to have regular tests and exams.  Things you can do on your own to prevent diseases and keep yourself healthy. What should I know about diet, weight, and exercise? Eat a healthy diet   Eat a diet that includes plenty of vegetables, fruits, low-fat dairy products, and lean protein.  Do not eat a lot of foods that are high in solid fats, added sugars, or sodium. Maintain a healthy weight Body mass index (BMI) is used to identify weight problems. It estimates body fat based on height and weight. Your health care provider can help determine your BMI and help you achieve or maintain a healthy weight. Get regular exercise Get regular exercise. This is one of the most important things you can do for your health. Most adults should:  Exercise for at least 150 minutes each week. The exercise should increase your heart rate and make you sweat (moderate-intensity exercise).  Do strengthening exercises at least twice a week. This is in addition to the moderate-intensity exercise.  Spend less time sitting. Even light physical activity can be beneficial. Watch cholesterol and blood lipids Have your blood tested for lipids and cholesterol at 66 years of age, then have this test every 5 years. Have your cholesterol levels checked more often if:  Your lipid or cholesterol levels are high.  You are older than 66 years of age.  You are at high risk for heart disease. What should I know about cancer screening? Depending on your health history and family history, you may need to have cancer screening at various ages. This may include screening for:  Breast cancer.  Cervical cancer.  Colorectal cancer.  Skin cancer.  Lung cancer. What should I know about heart disease, diabetes, and high blood  pressure? Blood pressure and heart disease  High blood pressure causes heart disease and increases the risk of stroke. This is more likely to develop in people who have high blood pressure readings, are of African descent, or are overweight.  Have your blood pressure checked: ? Every 3-5 years if you are 18-39 years of age. ? Every year if you are 40 years old or older. Diabetes Have regular diabetes screenings. This checks your fasting blood sugar level. Have the screening done:  Once every three years after age 40 if you are at a normal weight and have a low risk for diabetes.  More often and at a younger age if you are overweight or have a high risk for diabetes. What should I know about preventing infection? Hepatitis B If you have a higher risk for hepatitis B, you should be screened for this virus. Talk with your health care provider to find out if you are at risk for hepatitis B infection. Hepatitis C Testing is recommended for:  Everyone born from 1945 through 1965.  Anyone with known risk factors for hepatitis C. Sexually transmitted infections (STIs)  Get screened for STIs, including gonorrhea and chlamydia, if: ? You are sexually active and are younger than 66 years of age. ? You are older than 66 years of age and your health care provider tells you that you are at risk for this type of infection. ? Your sexual activity has changed since you were last screened, and you are at increased risk for chlamydia or gonorrhea. Ask your health care provider if   you are at risk.  Ask your health care provider about whether you are at high risk for HIV. Your health care provider may recommend a prescription medicine to help prevent HIV infection. If you choose to take medicine to prevent HIV, you should first get tested for HIV. You should then be tested every 3 months for as long as you are taking the medicine. Pregnancy  If you are about to stop having your period (premenopausal) and  you may become pregnant, seek counseling before you get pregnant.  Take 400 to 800 micrograms (mcg) of folic acid every day if you become pregnant.  Ask for birth control (contraception) if you want to prevent pregnancy. Osteoporosis and menopause Osteoporosis is a disease in which the bones lose minerals and strength with aging. This can result in bone fractures. If you are 65 years old or older, or if you are at risk for osteoporosis and fractures, ask your health care provider if you should:  Be screened for bone loss.  Take a calcium or vitamin D supplement to lower your risk of fractures.  Be given hormone replacement therapy (HRT) to treat symptoms of menopause. Follow these instructions at home: Lifestyle  Do not use any products that contain nicotine or tobacco, such as cigarettes, e-cigarettes, and chewing tobacco. If you need help quitting, ask your health care provider.  Do not use street drugs.  Do not share needles.  Ask your health care provider for help if you need support or information about quitting drugs. Alcohol use  Do not drink alcohol if: ? Your health care provider tells you not to drink. ? You are pregnant, may be pregnant, or are planning to become pregnant.  If you drink alcohol: ? Limit how much you use to 0-1 drink a day. ? Limit intake if you are breastfeeding.  Be aware of how much alcohol is in your drink. In the [place] one drink equals one 12 oz bottle of beer (355 mL), one 5 oz glass of wine (148 mL), or one 1 oz glass of hard liquor (44 mL). General instructions  Schedule regular health, dental, and eye exams.  Stay current with your vaccines.  Tell your health care provider if: ? You often feel depressed. ? You have ever been abused or do not feel safe at home. Summary  Adopting a healthy lifestyle and getting preventive care are important in promoting health and wellness.  Follow your health care provider's instructions about healthy  diet, exercising, and getting tested or screened for diseases.  Follow your health care provider's instructions on monitoring your cholesterol and blood pressure. This information is not intended to replace advice given to you by your health care provider. Make sure you discuss any questions you have with your health care provider. Document Revised: 05/12/2018 Document Reviewed: 05/12/2018 Elsevier Patient Education  2020 [employer]  

## 2019-09-26 NOTE — Patient Instructions (Signed)
Kelly Nguyen , Thank you for taking time to come for your Medicare Wellness Visit. I appreciate your ongoing commitment to your health goals. Please review the following plan we discussed and let me know if I can assist you in the future.   Screening recommendations/referrals: Colonoscopy: Up to date, due 01/2022 Mammogram: Up to date, due 02/2020 Bone Density: Up to date, due 03/2024 Recommended yearly ophthalmology/optometry visit for glaucoma screening and checkup Recommended yearly dental visit for hygiene and checkup  Vaccinations: Influenza vaccine: Up to date Pneumococcal vaccine: Prevnar 13 due (pt to receive at today's in office apt) Tdap vaccine: Up to date Shingles vaccine: Pt declines today.     Advanced directives: Please bring a copy of your POA (Power of Attorney) and/or Living Will to your next appointment.   Conditions/risks identified: Recommend to increase water intake to 6-8 8 oz glasses a day.   Next appointment: 2:00 PM today with [facility] 65 Years and Older, Female Preventive care refers to lifestyle choices and visits with your health care provider that can promote health and wellness. What does preventive care include?  A yearly physical exam. This is also called an annual well check.  Dental exams once or twice a year.  Routine eye exams. Ask your health care provider how often you should have your eyes checked.  Personal lifestyle choices, including:  Daily care of your teeth and gums.  Regular physical activity.  Eating a healthy diet.  Avoiding tobacco and drug use.  Limiting alcohol use.  Practicing safe sex.  Taking low-dose aspirin every day.  Taking vitamin and mineral supplements as recommended by your health care provider. What happens during an annual well check? The services and screenings done by your health care provider during your annual well check will depend on your age, overall health, lifestyle risk  factors, and family history of disease. Counseling  Your health care provider may ask you questions about your:  Alcohol use.  Tobacco use.  Drug use.  Emotional well-being.  Home and relationship well-being.  Sexual activity.  Eating habits.  History of falls.  Memory and ability to understand (cognition).  Work and work Statistician.  Reproductive health. Screening  You may have the following tests or measurements:  Height, weight, and BMI.  Blood pressure.  Lipid and cholesterol levels. These may be checked every 5 years, or more frequently if you are over 48 years old.  Skin check.  Lung cancer screening. You may have this screening every year starting at age 21 if you have a 30-pack-year history of smoking and currently smoke or have quit within the past 15 years.  Fecal occult blood test (FOBT) of the stool. You may have this test every year starting at age 64.  Flexible sigmoidoscopy or colonoscopy. You may have a sigmoidoscopy every 5 years or a colonoscopy every 10 years starting at age 65.  Hepatitis C blood test.  Hepatitis B blood test.  Sexually transmitted disease (STD) testing.  Diabetes screening. This is done by checking your blood sugar (glucose) after you have not eaten for a while (fasting). You may have this done every 1-3 years.  Bone density scan. This is done to screen for osteoporosis. You may have this done starting at age 22.  Mammogram. This may be done every 1-2 years. Talk to your health care provider about how often you should have regular mammograms. Talk with your health care provider about your test results, treatment options,  and if necessary, the need for more tests. Vaccines  Your health care provider may recommend certain vaccines, such as:  Influenza vaccine. This is recommended every year.  Tetanus, diphtheria, and acellular pertussis (Tdap, Td) vaccine. You may need a Td booster every 10 years.  Zoster vaccine. You  may need this after age 51.  Pneumococcal 13-valent conjugate (PCV13) vaccine. One dose is recommended after age 73.  Pneumococcal polysaccharide (PPSV23) vaccine. One dose is recommended after age 94. Talk to your health care provider about which screenings and vaccines you need and how often you need them. This information is not intended to replace advice given to you by your health care provider. Make sure you discuss any questions you have with your health care provider. Document Released: 06/15/2015 Document Revised: 02/06/2016 Document Reviewed: 03/20/2015 Elsevier Interactive Patient Education  2017 [employer] Prevention in the Home Falls can cause injuries. They can happen to people of all ages. There are many things you can do to make your home safe and to help prevent falls. What can I do on the outside of my home?  Regularly fix the edges of walkways and driveways and fix any cracks.  Remove anything that might make you trip as you walk through a door, such as a raised step or threshold.  Trim any bushes or trees on the path to your home.  Use bright outdoor lighting.  Clear any walking paths of anything that might make someone trip, such as rocks or tools.  Regularly check to see if handrails are loose or broken. Make sure that both sides of any steps have handrails.  Any raised decks and porches should have guardrails on the edges.  Have any leaves, snow, or ice cleared regularly.  Use sand or salt on walking paths during winter.  Clean up any spills in your garage right away. This includes oil or grease spills. What can I do in the bathroom?  Use night lights.  Install grab bars by the toilet and in the tub and shower. Do not use towel bars as grab bars.  Use non-skid mats or decals in the tub or shower.  If you need to sit down in the shower, use a plastic, non-slip stool.  Keep the floor dry. Clean up any water that spills on the floor as soon as  it happens.  Remove soap buildup in the tub or shower regularly.  Attach bath mats securely with double-sided non-slip rug tape.  Do not have throw rugs and other things on the floor that can make you trip. What can I do in the bedroom?  Use night lights.  Make sure that you have a light by your bed that is easy to reach.  Do not use any sheets or blankets that are too big for your bed. They should not hang down onto the floor.  Have a firm chair that has side arms. You can use this for support while you get dressed.  Do not have throw rugs and other things on the floor that can make you trip. What can I do in the kitchen?  Clean up any spills right away.  Avoid walking on wet floors.  Keep items that you use a lot in easy-to-reach places.  If you need to reach something above you, use a strong step stool that has a grab bar.  Keep electrical cords out of the way.  Do not use floor polish or wax that makes floors slippery. If  you must use wax, use non-skid floor wax.  Do not have throw rugs and other things on the floor that can make you trip. What can I do with my stairs?  Do not leave any items on the stairs.  Make sure that there are handrails on both sides of the stairs and use them. Fix handrails that are broken or loose. Make sure that handrails are as long as the stairways.  Check any carpeting to make sure that it is firmly attached to the stairs. Fix any carpet that is loose or worn.  Avoid having throw rugs at the top or bottom of the stairs. If you do have throw rugs, attach them to the floor with carpet tape.  Make sure that you have a light switch at the top of the stairs and the bottom of the stairs. If you do not have them, ask someone to add them for you. What else can I do to help prevent falls?  Wear shoes that:  Do not have high heels.  Have rubber bottoms.  Are comfortable and fit you well.  Are closed at the toe. Do not wear sandals.  If  you use a stepladder:  Make sure that it is fully opened. Do not climb a closed stepladder.  Make sure that both sides of the stepladder are locked into place.  Ask someone to hold it for you, if possible.  Clearly mark and make sure that you can see:  Any grab bars or handrails.  First and last steps.  Where the edge of each step is.  Use tools that help you move around (mobility aids) if they are needed. These include:  Canes.  Walkers.  Scooters.  Crutches.  Turn on the lights when you go into a dark area. Replace any light bulbs as soon as they burn out.  Set up your furniture so you have a clear path. Avoid moving your furniture around.  If any of your floors are uneven, fix them.  If there are any pets around you, be aware of where they are.  Review your medicines with your doctor. Some medicines can make you feel dizzy. This can increase your chance of falling. Ask your doctor what other things that you can do to help prevent falls. This information is not intended to replace advice given to you by your health care provider. Make sure you discuss any questions you have with your health care provider. Document Released: 03/15/2009 Document Revised: 10/25/2015 Document Reviewed: 06/23/2014 Elsevier Interactive Patient Education  2017 [employer].

## 2019-09-27 LAB — COMPREHENSIVE METABOLIC PANEL
ALT: 38 IU/L — ABNORMAL HIGH (ref 0–32)
AST: 32 IU/L (ref 0–40)
Albumin/Globulin Ratio: 1.9 (ref 1.2–2.2)
Albumin: 4.5 g/dL (ref 3.8–4.8)
Alkaline Phosphatase: 85 IU/L (ref 39–117)
BUN/Creatinine Ratio: 29 — ABNORMAL HIGH (ref 12–28)
BUN: 23 mg/dL (ref 8–27)
Bilirubin Total: 0.3 mg/dL (ref 0.0–1.2)
CO2: 24 mmol/L (ref 20–29)
Calcium: 9.7 mg/dL (ref 8.7–10.3)
Chloride: 102 mmol/L (ref 96–106)
Creatinine, Ser: 0.8 mg/dL (ref 0.57–1.00)
GFR calc Af Amer: 89 mL/min/{1.73_m2} (ref >59)
GFR calc non Af Amer: 77 mL/min/{1.73_m2} (ref >59)
Globulin, Total: 2.4 g/dL (ref 1.5–4.5)
Glucose: 93 mg/dL (ref 65–99)
Potassium: 4.2 mmol/L (ref 3.5–5.2)
Sodium: 141 mmol/L (ref 134–144)
Total Protein: 6.9 g/dL (ref 6.0–8.5)

## 2019-09-27 LAB — LIPID PANEL
Chol/HDL Ratio: 3.4 ratio (ref 0.0–4.4)
Cholesterol, Total: 193 mg/dL (ref 100–199)
HDL: 56 mg/dL (ref >39)
LDL Chol Calc (NIH): 125 mg/dL — ABNORMAL HIGH (ref 0–99)
Triglycerides: 66 mg/dL (ref 0–149)
VLDL Cholesterol Cal: 12 mg/dL (ref 5–40)

## 2019-09-27 LAB — CBC WITH DIFFERENTIAL/PLATELET
Basophils Absolute: 0 10*3/uL (ref 0.0–0.2)
Basos: 1 %
EOS (ABSOLUTE): 0.1 10*3/uL (ref 0.0–0.4)
Eos: 3 %
Hematocrit: 39.2 % (ref 34.0–46.6)
Hemoglobin: 13.1 g/dL (ref 11.1–15.9)
Immature Grans (Abs): 0 10*3/uL (ref 0.0–0.1)
Immature Granulocytes: 0 %
Lymphocytes Absolute: 1.5 10*3/uL (ref 0.7–3.1)
Lymphs: 31 %
MCH: 30.8 pg (ref 26.6–33.0)
MCHC: 33.4 g/dL (ref 31.5–35.7)
MCV: 92 fL (ref 79–97)
Monocytes Absolute: 0.4 10*3/uL (ref 0.1–0.9)
Monocytes: 9 %
Neutrophils Absolute: 2.7 10*3/uL (ref 1.4–7.0)
Neutrophils: 56 %
Platelets: 246 10*3/uL (ref 150–450)
RBC: 4.25 x10E6/uL (ref 3.77–5.28)
RDW: 12.4 % (ref 11.7–15.4)
WBC: 4.7 10*3/uL (ref 3.4–10.8)

## 2019-09-27 LAB — TSH: TSH: 1.37 u[IU]/mL (ref 0.450–4.500)

## 2019-09-28 NOTE — Addendum Note (Signed)
Addended by: Trinna Post on: 09/28/2019 10:15 AM   Modules accepted: Level of Service

## 2019-11-06 DIAGNOSIS — Z0100 Encounter for examination of eyes and vision without abnormal findings: Secondary | ICD-10-CM | POA: Diagnosis not present

## 2019-12-28 ENCOUNTER — Other Ambulatory Visit: Payer: Self-pay | Admitting: Physician Assistant

## 2019-12-28 DIAGNOSIS — Z1231 Encounter for screening mammogram for malignant neoplasm of breast: Secondary | ICD-10-CM

## 2020-01-23 ENCOUNTER — Ambulatory Visit
Admission: RE | Admit: 2020-01-23 | Discharge: 2020-01-23 | Disposition: A | Discharge status: Home / Self Care | Payer: Medicare HMO | Source: Ambulatory Visit | Attending: Physician Assistant | Admitting: Physician Assistant

## 2020-01-23 ENCOUNTER — Other Ambulatory Visit: Payer: Self-pay

## 2020-01-23 DIAGNOSIS — Z1231 Encounter for screening mammogram for malignant neoplasm of breast: Secondary | ICD-10-CM

## 2020-01-26 ENCOUNTER — Telehealth: Payer: Self-pay

## 2020-01-26 NOTE — Telephone Encounter (Signed)
Patient was advised.  

## 2020-01-26 NOTE — Telephone Encounter (Signed)
-----   Message from Mar Daring, [place] sent at 01/25/2020  4:57 PM EDT ----- Normal mammogram. Repeat screening in one year.

## 2020-02-28 ENCOUNTER — Other Ambulatory Visit: Payer: Self-pay | Admitting: Physician Assistant

## 2020-02-28 DIAGNOSIS — M1990 Unspecified osteoarthritis, unspecified site: Secondary | ICD-10-CM

## 2020-02-28 NOTE — Telephone Encounter (Signed)
Requested Prescriptions  Pending Prescriptions Disp Refills  . celecoxib (CELEBREX) 200 MG capsule [Pharmacy Med Name: CELECOXIB 200 MG CAPSULE] 90 capsule 1    Sig: TAKE ONE CAPSULE BY MOUTH DAILY     Analgesics:  COX2 Inhibitors Passed - 02/28/2020  8:29 AM      Passed - HGB in normal range and within 360 days    Hemoglobin  Date Value Ref Range Status  09/26/2019 13.1 11.1 - 15.9 g/dL Final         Passed - Cr in normal range and within 360 days    Creatinine, Ser  Date Value Ref Range Status  09/26/2019 0.80 0.57 - 1.00 mg/dL Final         Passed - Patient is not pregnant      Passed - Valid encounter within last 12 months    Recent Outpatient Visits          5 months ago Annual physical exam   [facility] [place], Wendee Beavers, PA-C   1 year ago Essential (primary) hypertension   [facility], Wendee Beavers, PA-C   1 year ago Annual physical exam   [facility] Carles Collet M, [place]   2 years ago Essential (primary) hypertension   [facility], Wendee Beavers, [place]   3 years ago Annual physical exam   [place], [place], [place]

## 2020-03-05 DIAGNOSIS — R69 Illness, unspecified: Secondary | ICD-10-CM | POA: Diagnosis not present

## 2020-03-06 DIAGNOSIS — R69 Illness, unspecified: Secondary | ICD-10-CM | POA: Diagnosis not present

## 2020-03-19 DIAGNOSIS — Z20822 Contact with and (suspected) exposure to covid-19: Secondary | ICD-10-CM | POA: Diagnosis not present

## 2020-03-22 ENCOUNTER — Other Ambulatory Visit: Payer: Self-pay | Admitting: Physician Assistant

## 2020-03-22 DIAGNOSIS — I10 Essential (primary) hypertension: Secondary | ICD-10-CM

## 2020-05-08 DIAGNOSIS — Z20822 Contact with and (suspected) exposure to covid-19: Secondary | ICD-10-CM | POA: Diagnosis not present

## 2020-05-14 ENCOUNTER — Telehealth (INDEPENDENT_AMBULATORY_CARE_PROVIDER_SITE_OTHER): Payer: Medicare HMO | Admitting: Physician Assistant

## 2020-05-14 ENCOUNTER — Other Ambulatory Visit: Payer: Self-pay

## 2020-05-14 DIAGNOSIS — J40 Bronchitis, not specified as acute or chronic: Secondary | ICD-10-CM

## 2020-05-14 DIAGNOSIS — J069 Acute upper respiratory infection, unspecified: Secondary | ICD-10-CM

## 2020-05-14 MED ORDER — PROMETHAZINE-DM 6.25-15 MG/5ML PO SYRP: 5.0000 mL | ORAL_SOLUTION | Freq: Every evening | ORAL | 0 refills | Status: DC | PRN

## 2020-05-14 NOTE — Progress Notes (Signed)
MyChart Video Visit    Virtual Visit via Video Note   This visit type was conducted due to national recommendations for restrictions regarding the COVID-19 Pandemic (e.g. social distancing) in an effort to limit this patient's exposure and mitigate transmission in our community. This patient is at least at moderate risk for complications without adequate follow up. This format is felt to be most appropriate for this patient at this time. Physical exam was limited by quality of the video and audio technology used for the visit.   Patient location: Home Provider location: Office   I discussed the limitations of evaluation and management by telemedicine and the availability of in person appointments. The patient expressed understanding and agreed to proceed.  Patient: Kelly Nguyen   DOB: 05/06/1956   64 y.o. Female  MRN: [id number] Visit Date: 05/14/2020  Today's healthcare provider: Trinna Post, PA-C   Chief Complaint  Patient presents with  . Cough  I,Kelly Nguyen,acting as a scribe for Trinna Post, PA-C.,have documented all relevant documentation on the behalf of Trinna Post, PA-C,as directed by  Trinna Post, PA-C while in the presence of Trinna Post, PA-C.  Subjective    Cough This is a new problem. The current episode started 1 to 4 weeks ago. The problem has been unchanged. The cough is non-productive. Associated symptoms include headaches, nasal congestion and rhinorrhea. Pertinent negatives include no chills, fever, postnasal drip, sore throat, shortness of breath or wheezing. She has tried OTC cough suppressant for the symptoms. The treatment provided mild relief. There is no history of asthma, bronchiectasis, bronchitis, COPD, emphysema, environmental allergies or pneumonia.    Patient reporting she has had a cough for a week. She felt poorly for a week and is now feeling better. The cough is prolonged. Symptoms started on Friday night. Took a COVID  test Tuesday. No positive contacts. She has had three vaccinations for COVID. No wheezing, just persistent dry hacking cough.    Medications: Outpatient Medications Prior to Visit  Medication Sig  . celecoxib (CELEBREX) 200 MG capsule TAKE ONE CAPSULE BY MOUTH DAILY  . cholecalciferol (VITAMIN D) 25 MCG (1000 UNIT) tablet Take 1,000 Units by mouth daily. + K2 45 mcg  . COLLAGEN PO Take 6,000 mg by mouth daily. + Biotin  . lisinopril-hydrochlorothiazide (ZESTORETIC) 10-12.5 MG tablet TAKE ONE TABLET BY MOUTH DAILY  . lisinopril-hydrochlorothiazide (ZESTORETIC) 20-25 MG tablet TAKE 1/2 TABLET DAILY  . metaxalone (SKELAXIN) 800 MG tablet Take 1 tablet (800 mg total) by mouth 3 (three) times daily as needed for muscle spasms. (Patient not taking: Reported on 09/26/2019)  . Multiple Vitamins-Minerals (MULTIVITAMIN WITH MINERALS) tablet Take 1 tablet by mouth daily.  . Omeprazole Magnesium (PRILOSEC OTC PO) Take 0.25 tablets by mouth every morning. 1/4 of a tablet  . Probiotic CAPS Take 1 capsule by mouth daily.  . vitamin B-12 (CYANOCOBALAMIN) 500 MCG tablet Take 500 mcg by mouth daily.   No facility-administered medications prior to visit.    Review of Systems  Constitutional: Negative for chills and fever.  HENT: Positive for rhinorrhea. Negative for postnasal drip and sore throat.   Respiratory: Positive for cough. Negative for shortness of breath and wheezing.   Allergic/Immunologic: Negative for environmental allergies.  Neurological: Positive for headaches.      Objective    There were no vitals taken for this visit.   Physical Exam Constitutional:      Appearance: Normal appearance.  Pulmonary:  Effort: Pulmonary effort is normal. No respiratory distress.  Neurological:     Mental Status: She is alert.  Psychiatric:        Mood and Affect: Mood normal.        Behavior: Behavior normal.        Assessment & Plan    1. Bronchitis  Patient declines steroids and  inhalers at this point. Will proceed with cough medication.  - promethazine-dextromethorphan (PROMETHAZINE-DM) 6.25-15 MG/5ML syrup; Take 5 mLs by mouth at bedtime as needed.  Dispense: 118 mL; Refill: 0  2. Upper respiratory tract infection, unspecified type    No follow-ups on file.     I discussed the assessment and treatment plan with the patient. The patient was provided an opportunity to ask questions and all were answered. The patient agreed with the plan and demonstrated an understanding of the instructions.   The patient was advised to call back or seek an in-person evaluation if the symptoms worsen or if the condition fails to improve as anticipated.   ITrinna Post, PA-C, have reviewed all documentation for this visit. The documentation on 05/14/20 for the exam, diagnosis, procedures, and orders are all accurate and complete.  The entirety of the information documented in the History of Present Illness, Review of Systems and Physical Exam were personally obtained by me. Portions of this information were initially documented by [facility] and reviewed by me for thoroughness and accuracy.    Paulene Floor [facility] [phone] (phone) [phone] (fax)  [place]

## 2020-06-04 ENCOUNTER — Other Ambulatory Visit: Payer: Self-pay | Admitting: Physician Assistant

## 2020-06-04 DIAGNOSIS — M1990 Unspecified osteoarthritis, unspecified site: Secondary | ICD-10-CM

## 2020-06-04 DIAGNOSIS — Z1152 Encounter for screening for COVID-19: Secondary | ICD-10-CM | POA: Diagnosis not present

## 2020-06-04 DIAGNOSIS — Z03818 Encounter for observation for suspected exposure to other biological agents ruled out: Secondary | ICD-10-CM | POA: Diagnosis not present

## 2020-06-04 NOTE — Telephone Encounter (Signed)
[facility] faxed refill request for the following medications:  celecoxib (CELEBREX) 200 MG capsule    Please advise.  

## 2020-06-05 NOTE — Telephone Encounter (Signed)
Please review. Thanks!  

## 2020-06-07 MED ORDER — CELECOXIB 200 MG PO CAPS: 200.0000 mg | ORAL_CAPSULE | Freq: Every day | ORAL | 1 refills | Status: DC

## 2020-06-13 DIAGNOSIS — M1712 Unilateral primary osteoarthritis, left knee: Secondary | ICD-10-CM | POA: Diagnosis not present

## 2020-06-13 DIAGNOSIS — M25562 Pain in left knee: Secondary | ICD-10-CM | POA: Diagnosis not present

## 2020-06-23 ENCOUNTER — Other Ambulatory Visit: Payer: Self-pay | Admitting: Physician Assistant

## 2020-06-23 DIAGNOSIS — I10 Essential (primary) hypertension: Secondary | ICD-10-CM

## 2020-06-25 NOTE — Telephone Encounter (Signed)
L.O.V. was on 09/26/2019 and next appointment is on 10/08/2020.

## 2020-07-16 DIAGNOSIS — M1712 Unilateral primary osteoarthritis, left knee: Secondary | ICD-10-CM | POA: Diagnosis not present

## 2020-07-22 ENCOUNTER — Telehealth: Payer: Self-pay | Admitting: Physician Assistant

## 2020-07-22 DIAGNOSIS — I10 Essential (primary) hypertension: Secondary | ICD-10-CM

## 2020-07-23 NOTE — Telephone Encounter (Signed)
Called patient and scheduled her for a blood pressure f/u.

## 2020-07-23 NOTE — Progress Notes (Signed)
Established patient visit   Patient: Kelly Nguyen   DOB: February 13, 1956   65 y.o. Female  MRN: [id number] Visit Date: 07/24/2020  Today's healthcare provider: Trinna Post, PA-C   Chief Complaint  Patient presents with  . Hypertension  I,Adriana M Pollak,acting as a scribe for [employer], PA-C.,have documented all relevant documentation on the behalf of Trinna Post, PA-C,as directed by  Trinna Post, PA-C while in the presence of Trinna Post, PA-C.  Subjective    HPI  Hypertension, follow-up  BP Readings from Last 3 Encounters:  07/24/20 136/75  09/26/19 116/74  09/01/19 120/70   Wt Readings from Last 3 Encounters:  07/24/20 175 lb 8 oz (79.6 kg)  09/26/19 177 lb (80.3 kg)  09/01/19 175 lb (79.4 kg)     She was last seen for hypertension 10 months ago.  BP at that visit was 116/74. Management since that visit includes continue current medication.  She reports good compliance with treatment. She is not having side effects.  She is following a Regular diet. She is exercising. She does not smoke.  Use of agents associated with hypertension: none.   Outside blood pressures are not being checked. Symptoms: No chest pain No chest pressure  No palpitations No syncope  No dyspnea No orthopnea  No paroxysmal nocturnal dyspnea No lower extremity edema   Pertinent labs: Lab Results  Component Value Date   CHOL 235 (H) 07/24/2020   HDL 67 07/24/2020   LDLCALC 157 (H) 07/24/2020   TRIG 65 07/24/2020   CHOLHDL 3.5 07/24/2020   Lab Results  Component Value Date   NA 137 07/24/2020   K 5.2 07/24/2020   CREATININE 0.91 07/24/2020   GFRNONAA 66 07/24/2020   GFRAA 76 07/24/2020   GLUCOSE 98 07/24/2020     The 10-year ASCVD risk score Mikey Bussing DC Jr., et al., 2013) is: 9.3%   ---------------------------------------------------------------------------------------------------      Medications: Outpatient Medications Prior to Visit  Medication  Sig  . celecoxib (CELEBREX) 200 MG capsule Take 1 capsule (200 mg total) by mouth daily.  . cholecalciferol (VITAMIN D) 25 MCG (1000 UNIT) tablet Take 1,000 Units by mouth daily. + K2 45 mcg  . metaxalone (SKELAXIN) 800 MG tablet Take 1 tablet (800 mg total) by mouth 3 (three) times daily as needed for muscle spasms.  . Multiple Vitamins-Minerals (MULTIVITAMIN WITH MINERALS) tablet Take 1 tablet by mouth daily.  . Omeprazole Magnesium (PRILOSEC OTC PO) Take 0.25 tablets by mouth every morning. 1/4 of a tablet  . Probiotic CAPS Take 1 capsule by mouth daily.  . vitamin B-12 (CYANOCOBALAMIN) 500 MCG tablet Take 500 mcg by mouth daily.  . [DISCONTINUED] COLLAGEN PO Take 6,000 mg by mouth daily. + Biotin  . [DISCONTINUED] lisinopril-hydrochlorothiazide (ZESTORETIC) 10-12.5 MG tablet TAKE ONE TABLET BY MOUTH DAILY  . [DISCONTINUED] lisinopril-hydrochlorothiazide (ZESTORETIC) 20-25 MG tablet TAKE 1/2 TABLET DAILY  . [DISCONTINUED] promethazine-dextromethorphan (PROMETHAZINE-DM) 6.25-15 MG/5ML syrup Take 5 mLs by mouth at bedtime as needed.   No facility-administered medications prior to visit.    Review of Systems  Constitutional: Negative.   Respiratory: Negative.   Hematological: Negative.   Psychiatric/Behavioral: Negative.        Objective    BP 136/75 (BP Location: Left Arm, Patient Position: Sitting, Cuff Size: Normal)   Pulse 60   Temp 98.5 F (36.9 C) (Oral)   Wt 175 lb 8 oz (79.6 kg)   SpO2 100%   BMI 28.33  kg/m     Physical Exam Constitutional:      Appearance: Normal appearance.  Cardiovascular:     Rate and Rhythm: Normal rate and regular rhythm.     Heart sounds: Normal heart sounds.  Pulmonary:     Effort: Pulmonary effort is normal.     Breath sounds: Normal breath sounds.  Skin:    General: Skin is warm and dry.  Neurological:     General: No focal deficit present.     Mental Status: She is alert and oriented to person, place, and time. Mental status is at  baseline.  Psychiatric:        Mood and Affect: Mood normal.        Behavior: Behavior normal.       Results for orders placed or performed in visit on 07/24/20  TSH  Result Value Ref Range   TSH 1.270 0.450 - 4.500 uIU/mL  Lipid panel  Result Value Ref Range   Cholesterol, Total 235 (H) 100 - 199 mg/dL   Triglycerides 65 0 - 149 mg/dL   HDL 67 >39 mg/dL   VLDL Cholesterol Cal 11 5 - 40 mg/dL   LDL Chol Calc (NIH) 157 (H) 0 - 99 mg/dL   Chol/HDL Ratio 3.5 0.0 - 4.4 ratio  Comprehensive metabolic panel  Result Value Ref Range   Glucose 98 65 - 99 mg/dL   BUN 26 8 - 27 mg/dL   Creatinine, Ser 0.91 0.57 - 1.00 mg/dL   GFR calc non Af Amer 66 >59 mL/min/1.73   GFR calc Af Amer 76 >59 mL/min/1.73   BUN/Creatinine Ratio 29 (H) 12 - 28   Sodium 137 134 - 144 mmol/L   Potassium 5.2 3.5 - 5.2 mmol/L   Chloride 99 96 - 106 mmol/L   CO2 22 20 - 29 mmol/L   Calcium 10.0 8.7 - 10.3 mg/dL   Total Protein 7.3 6.0 - 8.5 g/dL   Albumin 4.7 3.8 - 4.8 g/dL   Globulin, Total 2.6 1.5 - 4.5 g/dL   Albumin/Globulin Ratio 1.8 1.2 - 2.2   Bilirubin Total 0.4 0.0 - 1.2 mg/dL   Alkaline Phosphatase 86 44 - 121 IU/L   AST 28 0 - 40 IU/L   ALT 31 0 - 32 IU/L  CBC with Differential/Platelet  Result Value Ref Range   WBC 5.4 3.4 - 10.8 x10E3/uL   RBC 4.38 3.77 - 5.28 x10E6/uL   Hemoglobin 13.5 11.1 - 15.9 g/dL   Hematocrit 40.1 34.0 - 46.6 %   MCV 92 79 - 97 fL   MCH 30.8 26.6 - 33.0 pg   MCHC 33.7 31.5 - 35.7 g/dL   RDW 12.5 11.7 - 15.4 %   Platelets 253 150 - 450 x10E3/uL   Neutrophils 55 Not Estab. %   Lymphs 33 Not Estab. %   Monocytes 9 Not Estab. %   Eos 2 Not Estab. %   Basos 1 Not Estab. %   Neutrophils Absolute 3.0 1.4 - 7.0 x10E3/uL   Lymphocytes Absolute 1.8 0.7 - 3.1 x10E3/uL   Monocytes Absolute 0.5 0.1 - 0.9 x10E3/uL   EOS (ABSOLUTE) 0.1 0.0 - 0.4 x10E3/uL   Basophils Absolute 0.0 0.0 - 0.2 x10E3/uL   Immature Granulocytes 0 Not Estab. %   Immature Grans (Abs) 0.0 0.0 -  0.1 x10E3/uL    Assessment & Plan    1. Essential (primary) hypertension  - lisinopril-hydrochlorothiazide (ZESTORETIC) 10-12.5 MG tablet; Take 1 tablet by mouth daily.  Dispense: 90  tablet; Refill: 3   No follow-ups on file.      ITrinna Post, PA-C, have reviewed all documentation for this visit. The documentation on 07/25/20 for the exam, diagnosis, procedures, and orders are all accurate and complete.  The entirety of the information documented in the History of Present Illness, Review of Systems and Physical Exam were personally obtained by me. Portions of this information were initially documented by [facility] and reviewed by me for thoroughness and accuracy.     Paulene Floor  [facility] [phone] (phone) [phone] (fax)  [place]

## 2020-07-23 NOTE — Telephone Encounter (Signed)
Pt calling in regarding this medication. PT is requesting to have a nurse give her a call back. Please advise.

## 2020-07-24 ENCOUNTER — Other Ambulatory Visit: Payer: Self-pay

## 2020-07-24 ENCOUNTER — Ambulatory Visit (INDEPENDENT_AMBULATORY_CARE_PROVIDER_SITE_OTHER): Payer: Medicare HMO | Admitting: Physician Assistant

## 2020-07-24 ENCOUNTER — Encounter: Payer: Self-pay | Admitting: Physician Assistant

## 2020-07-24 VITALS — BP 136/75 | HR 60 | Temp 98.5°F | Wt 175.5 lb

## 2020-07-24 DIAGNOSIS — I10 Essential (primary) hypertension: Secondary | ICD-10-CM

## 2020-07-24 MED ORDER — LISINOPRIL-HYDROCHLOROTHIAZIDE 10-12.5 MG PO TABS: 1.0000 | ORAL_TABLET | Freq: Every day | ORAL | 3 refills | Status: DC

## 2020-07-24 NOTE — Patient Instructions (Signed)

## 2020-07-25 LAB — CBC WITH DIFFERENTIAL/PLATELET
Basophils Absolute: 0 10*3/uL (ref 0.0–0.2)
Basos: 1 %
EOS (ABSOLUTE): 0.1 10*3/uL (ref 0.0–0.4)
Eos: 2 %
Hematocrit: 40.1 % (ref 34.0–46.6)
Hemoglobin: 13.5 g/dL (ref 11.1–15.9)
Immature Grans (Abs): 0 10*3/uL (ref 0.0–0.1)
Immature Granulocytes: 0 %
Lymphocytes Absolute: 1.8 10*3/uL (ref 0.7–3.1)
Lymphs: 33 %
MCH: 30.8 pg (ref 26.6–33.0)
MCHC: 33.7 g/dL (ref 31.5–35.7)
MCV: 92 fL (ref 79–97)
Monocytes Absolute: 0.5 10*3/uL (ref 0.1–0.9)
Monocytes: 9 %
Neutrophils Absolute: 3 10*3/uL (ref 1.4–7.0)
Neutrophils: 55 %
Platelets: 253 10*3/uL (ref 150–450)
RBC: 4.38 x10E6/uL (ref 3.77–5.28)
RDW: 12.5 % (ref 11.7–15.4)
WBC: 5.4 10*3/uL (ref 3.4–10.8)

## 2020-07-25 LAB — LIPID PANEL
Chol/HDL Ratio: 3.5 ratio (ref 0.0–4.4)
Cholesterol, Total: 235 mg/dL — ABNORMAL HIGH (ref 100–199)
HDL: 67 mg/dL (ref >39)
LDL Chol Calc (NIH): 157 mg/dL — ABNORMAL HIGH (ref 0–99)
Triglycerides: 65 mg/dL (ref 0–149)
VLDL Cholesterol Cal: 11 mg/dL (ref 5–40)

## 2020-07-25 LAB — COMPREHENSIVE METABOLIC PANEL
ALT: 31 IU/L (ref 0–32)
AST: 28 IU/L (ref 0–40)
Albumin/Globulin Ratio: 1.8 (ref 1.2–2.2)
Albumin: 4.7 g/dL (ref 3.8–4.8)
Alkaline Phosphatase: 86 IU/L (ref 44–121)
BUN/Creatinine Ratio: 29 — ABNORMAL HIGH (ref 12–28)
BUN: 26 mg/dL (ref 8–27)
Bilirubin Total: 0.4 mg/dL (ref 0.0–1.2)
CO2: 22 mmol/L (ref 20–29)
Calcium: 10 mg/dL (ref 8.7–10.3)
Chloride: 99 mmol/L (ref 96–106)
Creatinine, Ser: 0.91 mg/dL (ref 0.57–1.00)
GFR calc Af Amer: 76 mL/min/{1.73_m2} (ref >59)
GFR calc non Af Amer: 66 mL/min/{1.73_m2} (ref >59)
Globulin, Total: 2.6 g/dL (ref 1.5–4.5)
Glucose: 98 mg/dL (ref 65–99)
Potassium: 5.2 mmol/L (ref 3.5–5.2)
Sodium: 137 mmol/L (ref 134–144)
Total Protein: 7.3 g/dL (ref 6.0–8.5)

## 2020-07-25 LAB — TSH: TSH: 1.27 u[IU]/mL (ref 0.450–4.500)

## 2020-10-05 DIAGNOSIS — L821 Other seborrheic keratosis: Secondary | ICD-10-CM | POA: Diagnosis not present

## 2020-10-05 DIAGNOSIS — D2239 Melanocytic nevi of other parts of face: Secondary | ICD-10-CM | POA: Diagnosis not present

## 2020-10-05 DIAGNOSIS — D692 Other nonthrombocytopenic purpura: Secondary | ICD-10-CM | POA: Diagnosis not present

## 2020-10-08 ENCOUNTER — Ambulatory Visit: Payer: Medicare HMO

## 2020-10-16 ENCOUNTER — Telehealth: Payer: Self-pay | Admitting: Family Medicine

## 2020-10-16 DIAGNOSIS — M1990 Unspecified osteoarthritis, unspecified site: Secondary | ICD-10-CM

## 2020-10-16 MED ORDER — CELECOXIB 200 MG PO CAPS: 200.0000 mg | ORAL_CAPSULE | Freq: Every day | ORAL | 1 refills | Status: DC

## 2020-10-16 NOTE — Telephone Encounter (Signed)
[facility] faxed refill request for the following medications:  celecoxib (CELEBREX) 200 MG capsule  90 day supply Last Rx: 06/07/20 Qty: 90 Refills: 1 LOV: 07/24/20 with Adriana NOV: 07/25/20 with Dr. Jacinto Reap Please advise. Thanks TNP

## 2020-10-23 ENCOUNTER — Telehealth: Payer: Self-pay | Admitting: Physician Assistant

## 2020-10-23 DIAGNOSIS — M1990 Unspecified osteoarthritis, unspecified site: Secondary | ICD-10-CM

## 2020-10-23 NOTE — Telephone Encounter (Signed)
Patient states her orthopedic advised her to take 2 tablets of her celecoxib (CELEBREX) 200 MG capsule if needed due to a possible torn meniscus. Patient has 6 pills left and requesting a new rx this time to reflect 2 times daily PRN. Pharmacy is requesting a new script to reflect.   [place], [place] Phone:  [phone]  Fax:  [phone]

## 2020-10-30 ENCOUNTER — Other Ambulatory Visit: Payer: Self-pay | Admitting: Family Medicine

## 2020-10-30 DIAGNOSIS — I10 Essential (primary) hypertension: Secondary | ICD-10-CM

## 2020-10-30 MED ORDER — LISINOPRIL-HYDROCHLOROTHIAZIDE 10-12.5 MG PO TABS
1.0000 | ORAL_TABLET | Freq: Every day | ORAL | 0 refills | Status: DC
Start: 2020-10-30 — End: 2021-07-18

## 2020-10-30 NOTE — Telephone Encounter (Signed)
Copied from [place] [phone]. Topic: Quick Communication - Rx Refill/Question >> Oct 30, 2020  9:16 AM Kelly Nguyen wrote: Pt on vacation and forgot medication at home. Pt request about 6 pills to get her through until she returns home   Medication: lisinopril-hydrochlorothiazide (ZESTORETIC) 10-12.5 MG tablet  Has the patient contacted their pharmacy? no  Preferred Pharmacy (with phone number or street name): [id number] - [place], [address]  Phone: [phone]   Fax: [phone]  Agent: Please be advised that RX refills may take up to 3 business days. We ask that you follow-up with your pharmacy.

## 2020-10-30 NOTE — Telephone Encounter (Signed)
   Notes to clinic:   Pt on vacation and forgot medication at home. Pt request about 6 pills to get her through until she returns home   Requested Prescriptions  Pending Prescriptions Disp Refills   lisinopril-hydrochlorothiazide (ZESTORETIC) 10-12.5 MG tablet 90 tablet 3    Sig: Take 1 tablet by mouth daily.      Cardiovascular:  ACEI + Diuretic Combos Passed - 10/30/2020  9:29 AM      Passed - Na in normal range and within 180 days    Sodium  Date Value Ref Range Status  07/24/2020 137 134 - 144 mmol/L Final          Passed - K in normal range and within 180 days    Potassium  Date Value Ref Range Status  07/24/2020 5.2 3.5 - 5.2 mmol/L Final          Passed - Cr in normal range and within 180 days    Creatinine, Ser  Date Value Ref Range Status  07/24/2020 0.91 0.57 - 1.00 mg/dL Final    Comment:                   **Effective July 30, 2020 [facility] will begin**                  reporting the 2021 CKD-EPI creatinine equation that                  estimates kidney function without a race variable.           Passed - Ca in normal range and within 180 days    Calcium  Date Value Ref Range Status  07/24/2020 10.0 8.7 - 10.3 mg/dL Final          Passed - Patient is not pregnant      Passed - Last BP in normal range    BP Readings from Last 1 Encounters:  07/24/20 136/75          Passed - Valid encounter within last 6 months    Recent Outpatient Visits           3 months ago Essential (primary) hypertension   [place], [place], PA-C   5 months ago [place], Wendee Beavers, [place]   1 year ago Annual physical exam   [facility] Trinna Post, [place]   1 year ago Essential (primary) hypertension   [facility] [place], Wendee Beavers, [place]   2 years ago Annual physical exam   [place], Wendee Beavers, [place]       Future Appointments             In 8  months Bacigalupo, Dionne Bucy, MD [facility], [place]

## 2020-11-22 DIAGNOSIS — M1712 Unilateral primary osteoarthritis, left knee: Secondary | ICD-10-CM | POA: Diagnosis not present

## 2021-02-07 DIAGNOSIS — M1712 Unilateral primary osteoarthritis, left knee: Secondary | ICD-10-CM | POA: Diagnosis not present

## 2021-02-27 ENCOUNTER — Telehealth: Payer: Self-pay | Admitting: Family Medicine

## 2021-02-27 NOTE — Telephone Encounter (Signed)
Left message for patient to call back and schedule the Medicare Annual Wellness Visit (AWV) virtually or by telephone.  Last AWV 09/26/19  Please schedule at anytime with Nurse Health Advisor.    Any questions, please call me at [phone]

## 2021-03-09 DIAGNOSIS — Z0100 Encounter for examination of eyes and vision without abnormal findings: Secondary | ICD-10-CM | POA: Diagnosis not present

## 2021-04-16 ENCOUNTER — Other Ambulatory Visit: Payer: Self-pay | Admitting: Family Medicine

## 2021-04-16 DIAGNOSIS — M1990 Unspecified osteoarthritis, unspecified site: Secondary | ICD-10-CM

## 2021-04-16 NOTE — Telephone Encounter (Signed)
Requested Prescriptions  Pending Prescriptions Disp Refills  . celecoxib (CELEBREX) 200 MG capsule [Pharmacy Med Name: CELECOXIB 200 MG CAPSULE] 90 capsule 1    Sig: TAKE ONE CAPSULE BY MOUTH DAILY     Analgesics:  COX2 Inhibitors Passed - 04/16/2021  1:56 PM      Passed - HGB in normal range and within 360 days    Hemoglobin  Date Value Ref Range Status  07/24/2020 13.5 11.1 - 15.9 g/dL Final         Passed - Cr in normal range and within 360 days    Creatinine, Ser  Date Value Ref Range Status  07/24/2020 0.91 0.57 - 1.00 mg/dL Final    Comment:                   **Effective July 30, 2020 [facility] will begin**                  reporting the 2021 CKD-EPI creatinine equation that                  estimates kidney function without a race variable.          Passed - Patient is not pregnant      Passed - Valid encounter within last 12 months    Recent Outpatient Visits          8 months ago Essential (primary) hypertension   [place], [place], [place]   11 months ago [place], Wendee Beavers, PA-C   1 year ago Annual physical exam   [facility] Carles Collet M, [place]   2 years ago Essential (primary) hypertension   [employer], Wendee Beavers, PA-C   3 years ago Annual physical exam   [facility] Trinna Post, PA-C      Future Appointments            In 3 months Bacigalupo, Dionne Bucy, MD [facility], [place]

## 2021-06-21 DIAGNOSIS — J019 Acute sinusitis, unspecified: Secondary | ICD-10-CM | POA: Diagnosis not present

## 2021-06-21 DIAGNOSIS — U071 COVID-19: Secondary | ICD-10-CM | POA: Diagnosis not present

## 2021-06-21 DIAGNOSIS — B9689 Other specified bacterial agents as the cause of diseases classified elsewhere: Secondary | ICD-10-CM | POA: Diagnosis not present

## 2021-06-21 DIAGNOSIS — J209 Acute bronchitis, unspecified: Secondary | ICD-10-CM | POA: Diagnosis not present

## 2021-06-21 DIAGNOSIS — Z03818 Encounter for observation for suspected exposure to other biological agents ruled out: Secondary | ICD-10-CM | POA: Diagnosis not present

## 2021-07-18 ENCOUNTER — Telehealth: Payer: Self-pay | Admitting: Family Medicine

## 2021-07-18 DIAGNOSIS — I10 Essential (primary) hypertension: Secondary | ICD-10-CM

## 2021-07-18 MED ORDER — LISINOPRIL-HYDROCHLOROTHIAZIDE 10-12.5 MG PO TABS: 1.0000 | ORAL_TABLET | Freq: Every day | ORAL | 0 refills | Status: DC

## 2021-07-18 NOTE — Telephone Encounter (Signed)
[facility] faxed refill request for the following medications:  lisinopril-hydrochlorothiazide (ZESTORETIC) 10-12.5 MG tablet   Please advise

## 2021-07-22 DIAGNOSIS — M1712 Unilateral primary osteoarthritis, left knee: Secondary | ICD-10-CM | POA: Diagnosis not present

## 2021-07-25 ENCOUNTER — Other Ambulatory Visit: Payer: Self-pay

## 2021-07-25 ENCOUNTER — Encounter: Payer: Self-pay | Admitting: Family Medicine

## 2021-07-25 ENCOUNTER — Ambulatory Visit (INDEPENDENT_AMBULATORY_CARE_PROVIDER_SITE_OTHER): Payer: Medicare HMO | Admitting: Family Medicine

## 2021-07-25 VITALS — BP 129/85 | HR 65 | Temp 98.5°F

## 2021-07-25 DIAGNOSIS — I10 Essential (primary) hypertension: Secondary | ICD-10-CM | POA: Diagnosis not present

## 2021-07-25 DIAGNOSIS — Z0000 Encounter for general adult medical examination without abnormal findings: Secondary | ICD-10-CM | POA: Diagnosis not present

## 2021-07-25 DIAGNOSIS — E782 Mixed hyperlipidemia: Secondary | ICD-10-CM

## 2021-07-25 DIAGNOSIS — Z1231 Encounter for screening mammogram for malignant neoplasm of breast: Secondary | ICD-10-CM

## 2021-07-25 NOTE — Assessment & Plan Note (Signed)
Reviewed last lipid panel Not currently on a statin Recheck FLP and CMP Discussed diet and exercise  

## 2021-07-25 NOTE — Assessment & Plan Note (Signed)
Well controlled Continue current medications Recheck metabolic panel F/u in 6 months  

## 2021-07-25 NOTE — Progress Notes (Signed)
Annual Wellness Visit     Patient: Kelly Nguyen, Female    DOB: [birth year], 66 y.o.   MRN: [id number] Visit Date: 07/25/2021  Today's Provider: Lavon Paganini, MD   Chief Complaint  Patient presents with   Medicare Wellness   I,Kelly Nguyen,acting as a scribe for Lavon Paganini, MD.,have documented all relevant documentation on the behalf of Lavon Paganini, MD,as directed by  Lavon Paganini, MD while in the presence of Lavon Paganini, MD.  Subjective    Kelly Nguyen is a 66 y.o. female who presents today for her Annual Wellness Visit. She reports consuming a general diet. Gym/ health club routine includes water aerobics. She generally feels well. She reports sleeping well. She does not have additional problems to discuss today.   HPI   Medications: Outpatient Medications Prior to Visit  Medication Sig   celecoxib (CELEBREX) 200 MG capsule TAKE ONE CAPSULE BY MOUTH DAILY   cholecalciferol (VITAMIN D) 25 MCG (1000 UNIT) tablet Take 1,000 Units by mouth daily. + K2 45 mcg   lisinopril-hydrochlorothiazide (ZESTORETIC) 10-12.5 MG tablet Take 1 tablet by mouth daily.   Multiple Vitamins-Minerals (MULTIVITAMIN WITH MINERALS) tablet Take 1 tablet by mouth daily.   Omeprazole Magnesium (PRILOSEC OTC PO) Take 0.25 tablets by mouth every morning. 1/4 of a tablet   Probiotic CAPS Take 1 capsule by mouth daily.   vitamin B-12 (CYANOCOBALAMIN) 500 MCG tablet Take 500 mcg by mouth daily.   No facility-administered medications prior to visit.    Allergies  Allergen Reactions   Acyclovir And Related    Meloxicam Rash    Patient Care Team: Mikey Kirschner, PA-C as PCP - General (Physician Assistant) Marry Guan Laurice Record, MD (Orthopedic Surgery) Arelia Sneddon, OD (Optometry)  Review of Systems  Constitutional: Negative.   HENT: Negative.    Eyes: Negative.   Respiratory: Negative.    Endocrine: Negative.   Genitourinary: Negative.   Musculoskeletal:  Positive for  arthralgias and joint swelling.  All other systems reviewed and are negative.   Last CBC Lab Results  Component Value Date   WBC 5.4 07/24/2020   HGB 13.5 07/24/2020   HCT 40.1 07/24/2020   MCV 92 07/24/2020   MCH 30.8 07/24/2020   RDW 12.5 07/24/2020   PLT 253 24/02/7352   Last metabolic panel Lab Results  Component Value Date   GLUCOSE 98 07/24/2020   NA 137 07/24/2020   K 5.2 07/24/2020   CL 99 07/24/2020   CO2 22 07/24/2020   BUN 26 07/24/2020   CREATININE 0.91 07/24/2020   GFRNONAA 66 07/24/2020   CALCIUM 10.0 07/24/2020   PROT 7.3 07/24/2020   ALBUMIN 4.7 07/24/2020   LABGLOB 2.6 07/24/2020   AGRATIO 1.8 07/24/2020   BILITOT 0.4 07/24/2020   ALKPHOS 86 07/24/2020   AST 28 07/24/2020   ALT 31 07/24/2020   ANIONGAP 10 02/16/2019   Last lipids Lab Results  Component Value Date   CHOL 235 (H) 07/24/2020   HDL 67 07/24/2020   LDLCALC 157 (H) 07/24/2020   TRIG 65 07/24/2020   CHOLHDL 3.5 07/24/2020    Last thyroid functions Lab Results  Component Value Date   TSH 1.270 07/24/2020        Objective    Vitals: BP 129/85    Pulse 65    Temp 98.5 F (36.9 C) (Temporal)  BP Readings from Last 3 Encounters:  07/25/21 129/85  07/24/20 136/75  09/26/19 116/74   Wt Readings from Last 3 Encounters:  07/24/20 175 lb 8 oz (79.6 kg)  09/26/19 177 lb (80.3 kg)  09/01/19 175 lb (79.4 kg)      Physical Exam Vitals reviewed.  Constitutional:      General: She is not in acute distress.    Appearance: Normal appearance. She is well-developed. She is not diaphoretic.  HENT:     Head: Normocephalic and atraumatic.     Right Ear: Tympanic membrane, ear canal and external ear normal.     Left Ear: Tympanic membrane, ear canal and external ear normal.     Nose: Nose normal.     Mouth/Throat:     Mouth: Mucous membranes are moist.     Pharynx: Oropharynx is clear. No oropharyngeal exudate.  Eyes:     General: No scleral icterus.    Conjunctiva/sclera:  Conjunctivae normal.     Pupils: Pupils are equal, round, and reactive to light.  Neck:     Thyroid: No thyromegaly.  Cardiovascular:     Rate and Rhythm: Normal rate and regular rhythm.     Pulses: Normal pulses.     Heart sounds: Normal heart sounds. No murmur heard. Pulmonary:     Effort: Pulmonary effort is normal. No respiratory distress.     Breath sounds: Normal breath sounds. No wheezing or rales.  Abdominal:     General: There is no distension.     Palpations: Abdomen is soft.     Tenderness: There is no abdominal tenderness.  Musculoskeletal:     Cervical back: Neck supple.     Right lower leg: No edema.     Left lower leg: No edema.  Lymphadenopathy:     Cervical: No cervical adenopathy.  Skin:    General: Skin is warm and dry.     Findings: No rash.  Neurological:     Mental Status: She is alert and oriented to person, place, and time. Mental status is at baseline.     Gait: Gait normal.  Psychiatric:        Mood and Affect: Mood normal.        Behavior: Behavior normal.        Thought Content: Thought content normal.     Most recent functional status assessment: In your present state of health, do you have any difficulty performing the following activities: 07/25/2021  Hearing? N  Vision? N  Difficulty concentrating or making decisions? N  Walking or climbing stairs? N  Dressing or bathing? N  Doing errands, shopping? N  Some recent data might be hidden   Most recent fall risk assessment: Fall Risk  07/25/2021  Falls in the past year? 0  Number falls in past yr: 0  Injury with Fall? 0  Risk for fall due to : No Fall Risks  Follow up Falls evaluation completed    Most recent depression screenings: PHQ 2/9 Scores 07/25/2021 09/26/2019  PHQ - 2 Score 0 0  PHQ- 9 Score 1 -   Most recent cognitive screening: 6CIT Screen 07/25/2021  What Year? 0 points  What month? 0 points  What time? 0 points  Count back from 20 0 points  Months in reverse 0 points   Repeat phrase 4 points  Total Score 4   Most recent Audit-C alcohol use screening Alcohol Use Disorder Test (AUDIT) 07/25/2021  1. How often do you have a drink containing alcohol? 4  2. How many drinks containing alcohol do you have on a typical day when you are drinking? 0  3.  How often do you have six or more drinks on one occasion? 0  AUDIT-C Score 4  4. How often during the last year have you found that you were not able to stop drinking once you had started? 0  5. How often during the last year have you failed to do what was normally expected from you because of drinking? 0  6. How often during the last year have you needed a first drink in the morning to get yourself going after a heavy drinking session? 0  7. How often during the last year have you had a feeling of guilt of remorse after drinking? 0  8. How often during the last year have you been unable to remember what happened the night before because you had been drinking? 0  9. Have you or someone else been injured as a result of your drinking? 0  10. Has a relative or friend or a doctor or another health worker been concerned about your drinking or suggested you cut down? 0  Alcohol Use Disorder Identification Test Final Score (AUDIT) 4  Alcohol Brief Interventions/Follow-up -   A score of 3 or more in women, and 4 or more in men indicates increased risk for alcohol abuse, EXCEPT if all of the points are from question 1   No results found for any visits on 07/25/21.  Assessment & Plan     Annual wellness visit done today including the all of the following: Reviewed patient's Family Medical History Reviewed and updated list of patient's medical providers Assessment of cognitive impairment was done Assessed patient's functional ability Established a written schedule for health screening [place] Completed and Reviewed  Exercise Activities and Dietary recommendations  Goals      DIET - INCREASE  WATER INTAKE     Recommend to drink at least 6-8 8oz glasses of water per day.        Immunization History  Administered Date(s) Administered   Influenza-Unspecified 04/18/2019   PFIZER(Purple Top)SARS-COV-2 Vaccination 06/23/2019, 07/15/2019   Pneumococcal Conjugate-13 09/26/2019   Pneumococcal Polysaccharide-23 08/11/2018   Td 09/19/2004   Tdap 04/25/2013    Health Maintenance  Topic Date Due   Zoster Vaccines- Shingrix (1 of 2) Never done   COVID-19 Vaccine (3 - Booster for Pfizer series) 09/09/2019   INFLUENZA VACCINE  08/30/2021 (Originally 12/31/2020)   MAMMOGRAM  01/22/2022   COLONOSCOPY (Pts 45-54yrs Insurance coverage will need to be confirmed)  02/27/2022   TETANUS/TDAP  04/26/2023   DEXA SCAN  03/02/2024   Pneumonia Vaccine 54+ Years old  Completed   Hepatitis C Screening  Completed   HPV VACCINES  Aged Out     Discussed health benefits of physical activity, and encouraged her to engage in regular exercise appropriate for her age and condition.    Problem List Items Addressed This Visit       Cardiovascular and Mediastinum   Essential (primary) hypertension    Well controlled Continue current medications Recheck metabolic panel F/u in 6 months       Relevant Orders   Comprehensive metabolic panel   Lipid panel     Other   Mixed hyperlipidemia    Reviewed last lipid panel Not currently on a statin Recheck FLP and CMP Discussed diet and exercise       Relevant Orders   Comprehensive metabolic panel   Lipid panel   Other Visit Diagnoses     Encounter for Medicare annual wellness exam    -  Primary   Encounter for annual physical exam       Screening mammogram for breast cancer       Relevant Orders   MM 3D SCREEN BREAST BILATERAL        Return in about 6 months (around 01/22/2022) for chronic disease f/u, With new PCP.     I, Lavon Paganini, MD, have reviewed all documentation for this visit. The documentation on 07/25/21 for the  exam, diagnosis, procedures, and orders are all accurate and complete.   Daquann Merriott, Dionne Bucy, MD, MPH [facility]

## 2021-07-26 LAB — COMPREHENSIVE METABOLIC PANEL
ALT: 27 IU/L (ref 0–32)
AST: 26 IU/L (ref 0–40)
Albumin/Globulin Ratio: 1.6 (ref 1.2–2.2)
Albumin: 4.3 g/dL (ref 3.8–4.8)
Alkaline Phosphatase: 73 IU/L (ref 44–121)
BUN/Creatinine Ratio: 30 — ABNORMAL HIGH (ref 12–28)
BUN: 27 mg/dL (ref 8–27)
Bilirubin Total: 0.4 mg/dL (ref 0.0–1.2)
CO2: 23 mmol/L (ref 20–29)
Calcium: 9.6 mg/dL (ref 8.7–10.3)
Chloride: 103 mmol/L (ref 96–106)
Creatinine, Ser: 0.91 mg/dL (ref 0.57–1.00)
Globulin, Total: 2.7 g/dL (ref 1.5–4.5)
Glucose: 109 mg/dL — ABNORMAL HIGH (ref 70–99)
Potassium: 4.9 mmol/L (ref 3.5–5.2)
Sodium: 139 mmol/L (ref 134–144)
Total Protein: 7 g/dL (ref 6.0–8.5)
eGFR: 69 mL/min/{1.73_m2} (ref >59)

## 2021-07-26 LAB — LIPID PANEL
Chol/HDL Ratio: 4.5 ratio — ABNORMAL HIGH (ref 0.0–4.4)
Cholesterol, Total: 217 mg/dL — ABNORMAL HIGH (ref 100–199)
HDL: 48 mg/dL (ref >39)
LDL Chol Calc (NIH): 144 mg/dL — ABNORMAL HIGH (ref 0–99)
Triglycerides: 141 mg/dL (ref 0–149)
VLDL Cholesterol Cal: 25 mg/dL (ref 5–40)

## 2021-08-19 ENCOUNTER — Other Ambulatory Visit: Payer: Self-pay | Admitting: Family Medicine

## 2021-08-19 DIAGNOSIS — I10 Essential (primary) hypertension: Secondary | ICD-10-CM

## 2021-09-16 ENCOUNTER — Telehealth: Payer: Self-pay | Admitting: Physician Assistant

## 2021-09-16 ENCOUNTER — Other Ambulatory Visit: Payer: Self-pay | Admitting: Physician Assistant

## 2021-09-16 DIAGNOSIS — I10 Essential (primary) hypertension: Secondary | ICD-10-CM

## 2021-09-16 MED ORDER — LISINOPRIL-HYDROCHLOROTHIAZIDE 10-12.5 MG PO TABS: 1.0000 | ORAL_TABLET | Freq: Every day | ORAL | 3 refills | Status: DC

## 2021-09-16 MED ORDER — LISINOPRIL-HYDROCHLOROTHIAZIDE 10-12.5 MG PO TABS: 1.0000 | ORAL_TABLET | Freq: Every day | ORAL | 0 refills | Status: DC

## 2021-09-16 NOTE — Telephone Encounter (Signed)
[facility] faxed refill request for the following medications:  lisinopril-hydrochlorothiazide (ZESTORETIC) 10-12.5 MG tablet   Please advise.  

## 2021-09-24 DIAGNOSIS — M1712 Unilateral primary osteoarthritis, left knee: Secondary | ICD-10-CM | POA: Diagnosis not present

## 2021-10-10 ENCOUNTER — Other Ambulatory Visit: Payer: Self-pay | Admitting: Family Medicine

## 2021-10-10 DIAGNOSIS — M1990 Unspecified osteoarthritis, unspecified site: Secondary | ICD-10-CM

## 2021-12-05 DIAGNOSIS — M1712 Unilateral primary osteoarthritis, left knee: Secondary | ICD-10-CM | POA: Diagnosis not present

## 2022-01-21 NOTE — Progress Notes (Unsigned)
     I,Sha'taria Tyson,acting as a Education administrator for [employer], PA-C.,have documented all relevant documentation on the behalf of Mikey Kirschner, PA-C,as directed by  Mikey Kirschner, PA-C while in the presence of Mikey Kirschner, PA-C.    Established patient visit   Patient: Kelly Nguyen   DOB: [birth year]   66 y.o. Female  MRN: [id number] Visit Date: 01/22/2022  Today's healthcare provider: Mikey Kirschner, PA-C   No chief complaint on file.  Subjective    HPI  Hypertension, follow-up  BP Readings from Last 3 Encounters:  07/25/21 129/85  07/24/20 136/75  09/26/19 116/74   Wt Readings from Last 3 Encounters:  07/24/20 175 lb 8 oz (79.6 kg)  09/26/19 177 lb (80.3 kg)  09/01/19 175 lb (79.4 kg)     She was last seen for hypertension 6 months ago.  BP at that visit was 129/85. Management since that visit includes continue current medications.  She reports {excellent/good/fair/poor:19665} compliance with treatment. She {is/is not:9024} having side effects. {document side effects if present:1} She is following a {diet:21022986} diet. She {is/is not:9024} exercising. She {does/does not:200015} smoke.  Use of agents associated with hypertension: {bp agents assoc with hypertension:511::"none"}.   Outside blood pressures are {***enter patient reported home BP readings, or 'not being checked':1}. Symptoms: {Yes/No:20286} chest pain {Yes/No:20286} chest pressure  {Yes/No:20286} palpitations {Yes/No:20286} syncope  {Yes/No:20286} dyspnea {Yes/No:20286} orthopnea  {Yes/No:20286} paroxysmal nocturnal dyspnea {Yes/No:20286} lower extremity edema   Pertinent labs Lab Results  Component Value Date   CHOL 217 (H) 07/25/2021   HDL 48 07/25/2021   LDLCALC 144 (H) 07/25/2021   TRIG 141 07/25/2021   CHOLHDL 4.5 (H) 07/25/2021   Lab Results  Component Value Date   NA 139 07/25/2021   K 4.9 07/25/2021   CREATININE 0.91 07/25/2021   EGFR 69 07/25/2021   GLUCOSE 109 (H) 07/25/2021   TSH  1.270 07/24/2020     The 10-year ASCVD risk score (Arnett DK, et al., 2019) is: 13%  ---------------------------------------------------------------------------------------------------   Medications: Outpatient Medications Prior to Visit  Medication Sig   celecoxib (CELEBREX) 200 MG capsule TAKE ONE CAPSULE BY MOUTH DAILY   cholecalciferol (VITAMIN D) 25 MCG (1000 UNIT) tablet Take 1,000 Units by mouth daily. + K2 45 mcg   lisinopril-hydrochlorothiazide (ZESTORETIC) 10-12.5 MG tablet Take 1 tablet by mouth daily.   Multiple Vitamins-Minerals (MULTIVITAMIN WITH MINERALS) tablet Take 1 tablet by mouth daily.   Omeprazole Magnesium (PRILOSEC OTC PO) Take 0.25 tablets by mouth every morning. 1/4 of a tablet   Probiotic CAPS Take 1 capsule by mouth daily.   vitamin B-12 (CYANOCOBALAMIN) 500 MCG tablet Take 500 mcg by mouth daily.   No facility-administered medications prior to visit.    Review of Systems  {Labs  Heme  Chem  Endocrine  Serology  Results Review (optional):23779}   Objective    There were no vitals taken for this visit. {Show previous vital signs (optional):23777}  Physical Exam  ***  No results found for any visits on 01/22/22.  Assessment & Plan     ***  No follow-ups on file.      {provider attestation***:1}   Mikey Kirschner, PA-C  [facility] [phone] (phone) [phone] (fax)  [place]

## 2022-01-22 ENCOUNTER — Ambulatory Visit (INDEPENDENT_AMBULATORY_CARE_PROVIDER_SITE_OTHER): Payer: Medicare HMO | Admitting: Physician Assistant

## 2022-01-22 ENCOUNTER — Encounter: Payer: Self-pay | Admitting: Physician Assistant

## 2022-01-22 VITALS — BP 160/81 | HR 54 | Ht 65.0 in | Wt 172.7 lb

## 2022-01-22 DIAGNOSIS — E782 Mixed hyperlipidemia: Secondary | ICD-10-CM | POA: Diagnosis not present

## 2022-01-22 DIAGNOSIS — I10 Essential (primary) hypertension: Secondary | ICD-10-CM | POA: Diagnosis not present

## 2022-01-22 DIAGNOSIS — K529 Noninfective gastroenteritis and colitis, unspecified: Secondary | ICD-10-CM | POA: Diagnosis not present

## 2022-01-22 NOTE — Assessment & Plan Note (Signed)
Hx of diverticulosis, due for colonoscopy Discussed various diets, sounds like she has tried many different options Advised she discuss with gi for possibility of ibs

## 2022-01-22 NOTE — Assessment & Plan Note (Addendum)
Managed with lisinopril 10 mg and hctz 12.5  Reviewed last cmp Elevated in office but pt admits to significant knee pain today Advised returning to office in 2-3 weeks to recheck or checking at home and doing virtual visit, pt prefers virtual

## 2022-01-22 NOTE — Assessment & Plan Note (Signed)
Elevated last visit and The 10-year ASCVD risk score (Arnett DK, et al., 2019) is: 13% Repeat fasting lipids

## 2022-01-23 LAB — COMPREHENSIVE METABOLIC PANEL
ALT: 23 IU/L (ref 0–32)
AST: 23 IU/L (ref 0–40)
Albumin/Globulin Ratio: 2 (ref 1.2–2.2)
Albumin: 4.6 g/dL (ref 3.9–4.9)
Alkaline Phosphatase: 91 IU/L (ref 44–121)
BUN/Creatinine Ratio: 19 (ref 12–28)
BUN: 18 mg/dL (ref 8–27)
Bilirubin Total: 0.5 mg/dL (ref 0.0–1.2)
CO2: 26 mmol/L (ref 20–29)
Calcium: 10 mg/dL (ref 8.7–10.3)
Chloride: 99 mmol/L (ref 96–106)
Creatinine, Ser: 0.96 mg/dL (ref 0.57–1.00)
Globulin, Total: 2.3 g/dL (ref 1.5–4.5)
Glucose: 112 mg/dL — ABNORMAL HIGH (ref 70–99)
Potassium: 5.2 mmol/L (ref 3.5–5.2)
Sodium: 139 mmol/L (ref 134–144)
Total Protein: 6.9 g/dL (ref 6.0–8.5)
eGFR: 64 mL/min/{1.73_m2} (ref >59)

## 2022-01-23 LAB — LIPID PANEL
Chol/HDL Ratio: 4.6 ratio — ABNORMAL HIGH (ref 0.0–4.4)
Cholesterol, Total: 221 mg/dL — ABNORMAL HIGH (ref 100–199)
HDL: 48 mg/dL (ref >39)
LDL Chol Calc (NIH): 144 mg/dL — ABNORMAL HIGH (ref 0–99)
Triglycerides: 163 mg/dL — ABNORMAL HIGH (ref 0–149)
VLDL Cholesterol Cal: 29 mg/dL (ref 5–40)

## 2022-02-02 DIAGNOSIS — Z0100 Encounter for examination of eyes and vision without abnormal findings: Secondary | ICD-10-CM | POA: Diagnosis not present

## 2022-02-06 DIAGNOSIS — M1712 Unilateral primary osteoarthritis, left knee: Secondary | ICD-10-CM | POA: Diagnosis not present

## 2022-02-06 DIAGNOSIS — Z96651 Presence of right artificial knee joint: Secondary | ICD-10-CM | POA: Diagnosis not present

## 2022-02-09 DIAGNOSIS — M1712 Unilateral primary osteoarthritis, left knee: Secondary | ICD-10-CM | POA: Insufficient documentation

## 2022-02-12 NOTE — Progress Notes (Signed)
I,Sha'taria Tyson,acting as a Education administrator for [employer], PA-C.,have documented all relevant documentation on the behalf of Mikey Kirschner, PA-C,as directed by  Mikey Kirschner, PA-C while in the presence of Mikey Kirschner, PA-C.  MyChart Video Visit    Virtual Visit via Video Note   This visit type was conducted due to national recommendations for restrictions regarding the COVID-19 Pandemic (e.g. social distancing) in an effort to limit this patient's exposure and mitigate transmission in our community. This patient is at least at moderate risk for complications without adequate follow up. This format is felt to be most appropriate for this patient at this time. Physical exam was limited by quality of the video and audio technology used for the visit.   Patient location: home Provider location: [facility]  I discussed the limitations of evaluation and management by telemedicine and the availability of in person appointments. The patient expressed understanding and agreed to proceed.  Patient: Kelly Nguyen   DOB: November 24, 1955   66 y.o. Female  MRN: [id number] Visit Date: 02/13/2022  Today's healthcare provider: Mikey Kirschner, PA-C   Cc. Htn f/u  Subjective    HPI  Hypertension, follow-up  BP Readings from Last 3 Encounters:  02/13/22 114/76  01/22/22 (!) 160/81  07/25/21 129/85   Wt Readings from Last 3 Encounters:  01/22/22 172 lb 11.2 oz (78.3 kg)  07/24/20 175 lb 8 oz (79.6 kg)  09/26/19 177 lb (80.3 kg)     She was last seen for hypertension 3 weeks ago.  BP at that visit was 160/81. Management since that visit includes managed with lisinopril 10 mg and hctz 12.5.  She reports excellent compliance with treatment. She is not having side effects.  She is following a Regular diet. She is exercising. She does not smoke.  Use of agents associated with hypertension: none.   Outside blood pressures are 114/76 today, and similar.  Symptoms: No chest pain No  chest pressure  No palpitations No syncope  No dyspnea No orthopnea  No paroxysmal nocturnal dyspnea No lower extremity edema   Pertinent labs Lab Results  Component Value Date   CHOL 221 (H) 01/22/2022   HDL 48 01/22/2022   LDLCALC 144 (H) 01/22/2022   TRIG 163 (H) 01/22/2022   CHOLHDL 4.6 (H) 01/22/2022   Lab Results  Component Value Date   NA 139 01/22/2022   K 5.2 01/22/2022   CREATININE 0.96 01/22/2022   EGFR 64 01/22/2022   GLUCOSE 112 (H) 01/22/2022   TSH 1.270 07/24/2020     The 10-year ASCVD risk score (Arnett DK, et al., 2019) is: 8.8%  ---------------------------------------------------------------------------------------------------    Medications: Outpatient Medications Prior to Visit  Medication Sig   celecoxib (CELEBREX) 200 MG capsule TAKE ONE CAPSULE BY MOUTH DAILY   lisinopril-hydrochlorothiazide (ZESTORETIC) 10-12.5 MG tablet Take 1 tablet by mouth daily.   Multiple Vitamins-Minerals (MULTIVITAMIN WITH MINERALS) tablet Take 1 tablet by mouth daily.   [DISCONTINUED] cholecalciferol (VITAMIN D) 25 MCG (1000 UNIT) tablet Take 1,000 Units by mouth daily. + K2 45 mcg   [DISCONTINUED] Omeprazole Magnesium (PRILOSEC OTC PO) Take 0.25 tablets by mouth every morning. 1/4 of a tablet   [DISCONTINUED] Probiotic CAPS Take 1 capsule by mouth daily.   [DISCONTINUED] vitamin B-12 (CYANOCOBALAMIN) 500 MCG tablet Take 500 mcg by mouth daily.   No facility-administered medications prior to visit.    Review of Systems  Constitutional:  Negative for fatigue and fever.  Respiratory:  Negative for cough and shortness of  breath.   Cardiovascular:  Negative for chest pain and leg swelling.  Gastrointestinal:  Negative for abdominal pain.  Neurological:  Negative for dizziness and headaches.       Objective    Blood pressure 114/76.    Physical Exam Constitutional:      Appearance: She is not ill-appearing.        Assessment & Plan     Problem List Items  Addressed This Visit       Cardiovascular and Mediastinum   Essential (primary) hypertension - Primary    Well controlled at home Continue current medications F/u 6 mo        Digestive   Chronic diarrhea    New ref to different office per pt       Relevant Orders   Ambulatory referral to Gastroenterology     Return in about 6 months (around 08/14/2022) for CPE, AVW.     I discussed the assessment and treatment plan with the patient. The patient was provided an opportunity to ask questions and all were answered. The patient agreed with the plan and demonstrated an understanding of the instructions.   The patient was advised to call back or seek an in-person evaluation if the symptoms worsen or if the condition fails to improve as anticipated.  I provided 5 minutes of non-face-to-face time during this encounter.  I, Mikey Kirschner, PA-C have reviewed all documentation for this visit. The documentation on  02/13/2022 for the exam, diagnosis, procedures, and orders are all accurate and complete.  Mikey Kirschner, PA-C [facility] [address] [place], [place], [address] Office: [phone] Fax: [phone]

## 2022-02-13 ENCOUNTER — Telehealth (INDEPENDENT_AMBULATORY_CARE_PROVIDER_SITE_OTHER): Payer: Medicare HMO | Admitting: Physician Assistant

## 2022-02-13 ENCOUNTER — Encounter: Payer: Self-pay | Admitting: Physician Assistant

## 2022-02-13 VITALS — BP 114/76

## 2022-02-13 DIAGNOSIS — I10 Essential (primary) hypertension: Secondary | ICD-10-CM | POA: Diagnosis not present

## 2022-02-13 DIAGNOSIS — K529 Noninfective gastroenteritis and colitis, unspecified: Secondary | ICD-10-CM | POA: Diagnosis not present

## 2022-02-13 NOTE — Assessment & Plan Note (Signed)
Well controlled at home Continue current medications F/u 6 mo

## 2022-02-13 NOTE — Assessment & Plan Note (Signed)
New ref to different office per pt

## 2022-03-05 ENCOUNTER — Encounter: Payer: Self-pay | Admitting: Physician Assistant

## 2022-03-08 DIAGNOSIS — Z03818 Encounter for observation for suspected exposure to other biological agents ruled out: Secondary | ICD-10-CM | POA: Diagnosis not present

## 2022-03-08 DIAGNOSIS — I10 Essential (primary) hypertension: Secondary | ICD-10-CM | POA: Diagnosis not present

## 2022-03-08 DIAGNOSIS — J069 Acute upper respiratory infection, unspecified: Secondary | ICD-10-CM | POA: Diagnosis not present

## 2022-03-11 ENCOUNTER — Other Ambulatory Visit: Payer: Self-pay | Admitting: Physician Assistant

## 2022-03-11 DIAGNOSIS — M1990 Unspecified osteoarthritis, unspecified site: Secondary | ICD-10-CM

## 2022-03-11 MED ORDER — CELECOXIB 200 MG PO CAPS: 200.0000 mg | ORAL_CAPSULE | Freq: Every day | ORAL | 0 refills | Status: DC

## 2022-03-11 NOTE — Telephone Encounter (Signed)
[facility] faxed refill request for the following medications:  celecoxib (CELEBREX) 200 MG capsule    Please advise.  

## 2022-03-11 NOTE — Addendum Note (Signed)
Addended by: Julieta Bellini on: 03/11/2022 11:46 AM   Modules accepted: Orders

## 2022-03-18 ENCOUNTER — Ambulatory Visit
Admission: RE | Admit: 2022-03-18 | Discharge: 2022-03-18 | Disposition: A | Discharge status: Home / Self Care | Payer: Medicare HMO | Source: Ambulatory Visit | Attending: Family Medicine | Admitting: Family Medicine

## 2022-03-18 DIAGNOSIS — Z1231 Encounter for screening mammogram for malignant neoplasm of breast: Secondary | ICD-10-CM | POA: Diagnosis not present

## 2022-03-25 ENCOUNTER — Encounter
Admission: RE | Admit: 2022-03-25 | Discharge: 2022-03-25 | Disposition: A | Discharge status: Home / Self Care | Payer: Medicare HMO | Source: Ambulatory Visit | Attending: Orthopedic Surgery | Admitting: Orthopedic Surgery

## 2022-03-25 VITALS — BP 155/80 | HR 61 | Temp 97.9°F | Resp 16 | Ht 65.0 in | Wt 174.2 lb

## 2022-03-25 DIAGNOSIS — Z01818 Encounter for other preprocedural examination: Secondary | ICD-10-CM | POA: Diagnosis not present

## 2022-03-25 DIAGNOSIS — K529 Noninfective gastroenteritis and colitis, unspecified: Secondary | ICD-10-CM

## 2022-03-25 DIAGNOSIS — M1712 Unilateral primary osteoarthritis, left knee: Secondary | ICD-10-CM | POA: Diagnosis not present

## 2022-03-25 DIAGNOSIS — Z01812 Encounter for preprocedural laboratory examination: Secondary | ICD-10-CM

## 2022-03-25 DIAGNOSIS — R011 Cardiac murmur, unspecified: Secondary | ICD-10-CM

## 2022-03-25 DIAGNOSIS — K5730 Diverticulosis of large intestine without perforation or abscess without bleeding: Secondary | ICD-10-CM

## 2022-03-25 DIAGNOSIS — E782 Mixed hyperlipidemia: Secondary | ICD-10-CM

## 2022-03-25 DIAGNOSIS — I10 Essential (primary) hypertension: Secondary | ICD-10-CM

## 2022-03-25 HISTORY — DX: Cardiac murmur, unspecified: R01.1

## 2022-03-25 LAB — COMPREHENSIVE METABOLIC PANEL
ALT: 33 U/L (ref 0–44)
AST: 29 U/L (ref 15–41)
Albumin: 4 g/dL (ref 3.5–5.0)
Alkaline Phosphatase: 68 U/L (ref 38–126)
Anion gap: 11 (ref 5–15)
BUN: 21 mg/dL (ref 8–23)
CO2: 23 mmol/L (ref 22–32)
Calcium: 9.6 mg/dL (ref 8.9–10.3)
Chloride: 104 mmol/L (ref 98–111)
Creatinine, Ser: 0.82 mg/dL (ref 0.44–1.00)
GFR, Estimated: >60 mL/min (ref >60)
Glucose, Bld: 95 mg/dL (ref 70–99)
Potassium: 4.2 mmol/L (ref 3.5–5.1)
Sodium: 138 mmol/L (ref 135–145)
Total Bilirubin: 0.8 mg/dL (ref 0.3–1.2)
Total Protein: 7.1 g/dL (ref 6.5–8.1)

## 2022-03-25 LAB — URINALYSIS, ROUTINE W REFLEX MICROSCOPIC
Bilirubin Urine: NEGATIVE
Glucose, UA: NEGATIVE mg/dL
Hgb urine dipstick: NEGATIVE
Ketones, ur: NEGATIVE mg/dL
Leukocytes,Ua: NEGATIVE
Nitrite: NEGATIVE
Protein, ur: NEGATIVE mg/dL
Specific Gravity, Urine: 1.005 (ref 1.005–1.030)
pH: 7 (ref 5.0–8.0)

## 2022-03-25 LAB — CBC
HCT: 36.9 % (ref 36.0–46.0)
Hemoglobin: 12.1 g/dL (ref 12.0–15.0)
MCH: 30.5 pg (ref 26.0–34.0)
MCHC: 32.8 g/dL (ref 30.0–36.0)
MCV: 92.9 fL (ref 80.0–100.0)
Platelets: 267 10*3/uL (ref 150–400)
RBC: 3.97 MIL/uL (ref 3.87–5.11)
RDW: 13.2 % (ref 11.5–15.5)
WBC: 5.2 10*3/uL (ref 4.0–10.5)
nRBC: 0 % (ref 0.0–0.2)

## 2022-03-25 LAB — TYPE AND SCREEN
ABO/RH(D): A POS
Antibody Screen: NEGATIVE

## 2022-03-25 LAB — SURGICAL PCR SCREEN
MRSA, PCR: NEGATIVE
Staphylococcus aureus: POSITIVE — AB

## 2022-03-25 LAB — C-REACTIVE PROTEIN: CRP: 0.6 mg/dL (ref <1.0)

## 2022-03-25 LAB — SEDIMENTATION RATE: Sed Rate: 17 mm/hr (ref 0–30)

## 2022-03-25 NOTE — Discharge Instructions (Signed)
Instructions after Total Knee Replacement   Maisee Vollman P. Tameia Rafferty, Jr., M.D.     Dept. of Orthopaedics & Sports Medicine  [facility]  [address]  [place], [place]  [address]  Phone: [phone]   Fax: [phone]    DIET: Drink plenty of non-alcoholic fluids. Resume your normal diet. Include foods high in fiber.  ACTIVITY:  You may use crutches or a walker with weight-bearing as tolerated, unless instructed otherwise. You may be weaned off of the walker or crutches by your Physical Therapist.  Do NOT place pillows under the knee. Anything placed under the knee could limit your ability to straighten the knee.   Continue doing gentle exercises. Exercising will reduce the pain and swelling, increase motion, and prevent muscle weakness.   Please continue to use the TED compression stockings for 6 weeks. You may remove the stockings at night, but should reapply them in the morning. Do not drive or operate any equipment until instructed.  WOUND CARE:  Continue to use the PolarCare or ice packs periodically to reduce pain and swelling. You may bathe or shower after the staples are removed at the first office visit following surgery.  MEDICATIONS: You may resume your regular medications. Please take the pain medication as prescribed on the medication. Do not take pain medication on an empty stomach. You have been given a prescription for a blood thinner (Lovenox or Coumadin). Please take the medication as instructed. (NOTE: After completing a 2 week course of Lovenox, take one Enteric-coated aspirin once a day. This along with elevation will help reduce the possibility of phlebitis in your operated leg.) Do not drive or drink alcoholic beverages when taking pain medications.  CALL THE OFFICE FOR: Temperature above 101 degrees Excessive bleeding or drainage on the dressing. Excessive swelling, coldness, or paleness of the toes. Persistent nausea and vomiting.  FOLLOW-UP:  You  should have an appointment to return to the office in 10-14 days after surgery. Arrangements have been made for continuation of Physical Therapy (either home therapy or outpatient therapy).   [facility] Department Directory         [email or web address]       [email or web address]          Cardiology  Appointments: [place] - [phone] [place] - [phone]  Endocrinology  Appointments: [place] - [phone] [place] - [phone]  Gastroenterology  Appointments: [place] - [phone] [place] - [phone]        General Surgery   Appointments: [place] - [phone]  Internal Medicine/Family Medicine  Appointments: [place] - [phone] [place] - [phone] [place] - [phone]  Metabolic and Weigh Loss Surgery  Appointments: [place] - [phone]        Neurology  Appointments: [place] - [phone] [place] - [phone]  Neurosurgery  Appointments: [place] - [phone]  Obstetrics & Gynecology  Appointments: [place] - [phone] [place] - [phone]        Pediatrics  Appointments: [place] - [phone] [place] - [phone]  Physiatry  Appointments: [place] -[phone]  Physical Therapy  Appointments: [place] - [phone] [place] - [phone]        Podiatry  Appointments: [place] - [phone] [place] - [phone]  Pulmonology  Appointments: [place] - [phone]  Rheumatology  Appointments: [place] - [phone]        [place] Location: [facility]  [address] [place], [place]  [address]  [place] Location: [facility] [address] [place], [place]  [address]  [place] Location: [facility] [address] [place], [place]  [address]    

## 2022-03-25 NOTE — Patient Instructions (Signed)
Your procedure is scheduled on: Monday April 07, 2022. Report to Day Surgery inside [facility] 2nd floor, stop by registration desk before getting on elevator.  To find out your arrival time please call [phone] between 1PM - 3PM on Friday April 04, 2022.  Remember: Instructions that are not followed completely may result in serious medical risk,  up to and including death, or upon the discretion of your surgeon and anesthesiologist your  surgery may need to be rescheduled.     _X__ 1. Do not eat food after midnight the night before your procedure.                 No chewing gum or hard candies. You may drink clear liquids up to 2 hours                 before you are scheduled to arrive for your surgery- DO not drink clear                 liquids within 2 hours of the start of your surgery.                 Clear Liquids include:  water, apple juice without pulp, clear Gatorade, G2 or                  Gatorade Zero (avoid Red/Purple/Blue), Black Coffee or Tea (Do not add                 anything to coffee or tea).  __X__2.   Complete the "Ensure Clear Pre-surgery Clear Carbohydrate Drink" provided to you, 2 hours before arrival. **If you are diabetic you will be provided with an alternative drink, Gatorade Zero or G2.  __X__3.  On the morning of surgery brush your teeth with toothpaste and water, you                may rinse your mouth with mouthwash if you wish.  Do not swallow any toothpaste of mouthwash.     _X__ 4.  No Alcohol for 24 hours before or after surgery.   _X__ 5.  Do Not Smoke or use e-cigarettes For 24 Hours Prior to Your Surgery.                 Do not use any chewable tobacco products for at least 6 hours prior to                 Surgery.  _X__  6.  Do not use any recreational drugs (marijuana, cocaine, heroin, ecstasy, MDMA or other)                For at least one week prior to your surgery.  Combination of these drugs with  anesthesia                May have life threatening results.  ____  7.  Bring all medications with you on the day of surgery if instructed.   __X__8.  Notify your doctor if there is any change in your medical condition      (cold, fever, infections).     Do not wear jewelry, make-up, hairpins, clips or nail polish. Do not wear lotions, powders, or perfumes. You may wear deodorant. Do not shave 48 hours prior to surgery. Men may shave face and neck. Do not bring valuables to the hospital.    [facility] is not responsible for any belongings or valuables.  Contacts,  dentures or bridgework may not be worn into surgery. Leave your suitcase in the car. After surgery it may be brought to your room. For patients admitted to the hospital, discharge time is determined by your treatment team.   Patients discharged the day of surgery will not be allowed to drive home.   Make arrangements for someone to be with you for the first 24 hours of your Same Day Discharge.   __X__ Take these medicines the morning of surgery with A SIP OF WATER:    1. None   2.   3.   4.  5.  6.  ____ Fleet Enema (as directed)   __X__ Use CHG Soap (or wipes) as directed  ____ Use Benzoyl Peroxide Gel as instructed  ____ Use inhalers on the day of surgery  ____ Stop metformin 2 days prior to surgery    ____ Take 1/2 of usual insulin dose the night before surgery. No insulin the morning          of surgery.   ____ Call your PCP, cardiologist, or Pulmonologist if taking Coumadin/Plavix/aspirin and ask when to stop before your surgery.   __X__ One Week prior to surgery- Stop Anti-inflammatories such as Ibuprofen, Aleve, Advil, Motrin, meloxicam (MOBIC), diclofenac, etodolac, ketorolac, Toradol, Daypro, piroxicam, Goody's or BC powders. OK TO USE TYLENOL IF NEEDED   __X__ Stop supplements until after surgery.    ____ Bring C-Pap to the hospital.    If you have any questions regarding your pre-procedure  instructions,  Please call Pre-admit Testing at [phone]

## 2022-03-27 DIAGNOSIS — M1712 Unilateral primary osteoarthritis, left knee: Secondary | ICD-10-CM | POA: Diagnosis not present

## 2022-04-06 ENCOUNTER — Encounter: Payer: Self-pay | Admitting: Orthopedic Surgery

## 2022-04-06 NOTE — H&P (Signed)
ORTHOPAEDIC HISTORY & PHYSICAL Kelly Nguyen, [place] - 03/27/2022 8:45 AM EDT Formatting of this note is different from the original. [place] [facility]IC[facility] Chief Complaint:  Chief Complaint Patient presents with Knee Pain H & P LEFT KNEE  History of Present Illness:  Kelly Nguyen is a 66 y.o. female that presents to clinic today for her preoperative history and evaluation. Patient presents with her husband. The patient is scheduled to undergo a left total knee arthroplasty on 04/07/22 by Dr. Marry Guan. Her pain began several years ago. The pain is located primarily along the medial anterior aspects of the knee. She describes her pain as worse with weightbearing, going up and down stairs, and rising after sitting. She reports associated swelling and some giving way of the knee. She denies associated numbness or tingling, denies locking.  The patient's symptoms have progressed to the point that they decrease her quality of life. The patient has previously undergone conservative treatment including NSAIDS and injections to the knee without adequate control of her symptoms.  Denies history of lumbar surgery, DVT, or significant cardiac history.  Past Medical, Surgical, Family, Social History, Allergies, Medications:  Past Medical History: Past Medical History: Diagnosis Date Chicken pox Enlarged heart Hypertension  Past Surgical History: Past Surgical History: Procedure Laterality Date RIGHT ROTATOR CUFF SURGERY Right 2005 CARDIAC CATHERIZATION 2005 RIGHT FOOT SURGERY Right 2005 Right total knee arthroplasty using computer-assisted navigation 05/27/2017 Dr Marry Guan CHOLECYSTECTOMY HYSTERECTOMY UPPER AND LOWER ENDOSCOPY  Current Medications: Current Outpatient Medications Medication Sig Dispense Refill ascorbic acid/vitamin E/biotin (HAIR, SKIN, NAILS WITH BIOTIN ORAL) Take 3 tablets by mouth once daily celecoxib (CELEBREX) 200 MG capsule Take 1  capsule (200 mg total) by mouth 2 (two) times daily. (Patient taking differently: Take 200 mg by mouth once daily) 60 capsule 1 fluticasone (FLONASE) 50 mcg/actuation nasal spray Place 1 spray into both nostrils once daily as needed Lactobac no.41/Bifidobact no.7 (PROBIOTIC-10 ORAL) Take 1 capsule by mouth once daily lisinopriL-hydrochlorothiazide (ZESTORETIC) 10-12.5 mg tablet Take 1 tablet by mouth once daily multivitamin with minerals tablet Take 1 tablet by mouth once daily.   No current facility-administered medications for this visit.  Allergies: Allergies Allergen Reactions Meloxicam Rash  Social History: Social History  Socioeconomic History Marital status: Married Spouse name: Joe Kuntzman Number of children: 1 Years of education: 13+ Occupational History Occupation: Retired Tobacco Use Smoking status: Never Smokeless tobacco: Never Vaping Use Vaping Use: Never used Substance and Sexual Activity Alcohol use: Yes Alcohol/week: 3.0 standard drinks of alcohol Types: 3 Glasses of wine per week Drug use: No Sexual activity: Yes Partners: Male  Family History: Family History Problem Relation Age of Onset Mental illness Mother Myocardial Infarction (Heart attack) Father Heart disease Father  Review of Systems:  A 10+ ROS was performed, reviewed, and the pertinent orthopaedic findings are documented in the HPI.  Physical Examination:  BP 122/84 (BP Location: Left upper arm, Patient Position: Sitting, BP Cuff Size: Adult)  Ht 167.6 cm ('5\' 6"'$ )  Wt 78.9 kg (174 lb)  BMI 28.08 kg/m  Patient is a well-developed, well-nourished female in no acute distress. Patient has normal mood and affect. Patient is alert and oriented to person, place, and time.  HEENT: Atraumatic, normocephalic. Pupils equal and reactive to light. Extraocular motion intact. Noninjected sclera.  Cardiovascular: Regular rate and rhythm, with no murmurs, rubs, or gallops. Distal pulses  palpable.  Respiratory: Lungs clear to auscultation bilaterally.  Left Knee: Soft tissue swelling: mild Effusion: minimal Erythema:  none Crepitance: moderate Tenderness: medial, lateral, patellar Alignment: normal Mediolateral laxity: stable Posterior sag: negative Patellar tracking: Good tracking without evidence of subluxation or tilt Atrophy: No significant atrophy. Quadriceps tone was good. Range of motion: 0/3/130 degrees  Able to plantarflex and dorsiflex the left ankle. Able to flex and extend the toes.  Sensation intact over the saphenous, lateral sural cutaneous, superficial fibular, and deep fibular nerve distributions.  Tests Performed/Reviewed: X-rays  Previous x-rays of the left knee were obtained. Images reveal complete loss of femoral acetabular joint space with bone-on-bone articulation and osteophyte formation. Mild loss of medial joint space appreciated.  Impression:  ICD-10-CM 1. Primary osteoarthritis of left knee M17.12  Plan:  The patient has end-stage degenerative changes of the left knee. It was explained to the patient that the condition is progressive in nature. Having failed conservative treatment, the patient has elected to proceed with a total joint arthroplasty. The patient will undergo a total joint arthroplasty with Dr. Marry Guan. The risks of surgery, including blood clot and infection, were discussed with the patient. Measures to reduce these risks, including the use of anticoagulation, perioperative antibiotics, and early ambulation were discussed. The importance of postoperative physical therapy was discussed with the patient. The patient elects to proceed with surgery. The patient is instructed to stop all blood thinners prior to surgery. The patient is instructed to call the hospital the day before surgery to learn of the proper arrival time.  Contact our office with any questions or concerns. Follow up as indicated, or sooner should any new  problems arise, if conditions worsen, or if they are otherwise concerned.  Kelly Nguyen, [facility] [facility] [place], [place] [address] Phone: [phone]  This note was generated in part with voice recognition software and I apologize for any typographical errors that were not detected and corrected.  Electronically signed by Kelly Fudge, PA at 03/27/2022 4:53 PM EDT

## 2022-04-07 ENCOUNTER — Encounter
Admission: RE | Disposition: A | Discharge status: Home / Self Care | Payer: Self-pay | Source: Ambulatory Visit | Attending: Orthopedic Surgery

## 2022-04-07 ENCOUNTER — Observation Stay
Admission: RE | Admit: 2022-04-07 | Discharge: 2022-04-08 | Disposition: A | Discharge status: Home / Self Care | Payer: Medicare HMO | Source: Ambulatory Visit | Attending: Orthopedic Surgery | Admitting: Orthopedic Surgery

## 2022-04-07 ENCOUNTER — Observation Stay: Payer: Medicare HMO

## 2022-04-07 ENCOUNTER — Other Ambulatory Visit: Payer: Self-pay

## 2022-04-07 ENCOUNTER — Ambulatory Visit: Payer: Medicare HMO | Admitting: Certified Registered Nurse Anesthetist

## 2022-04-07 ENCOUNTER — Ambulatory Visit: Payer: Medicare HMO | Admitting: Urgent Care

## 2022-04-07 ENCOUNTER — Encounter: Payer: Self-pay | Admitting: Orthopedic Surgery

## 2022-04-07 DIAGNOSIS — Z96651 Presence of right artificial knee joint: Secondary | ICD-10-CM | POA: Diagnosis not present

## 2022-04-07 DIAGNOSIS — Z79899 Other long term (current) drug therapy: Secondary | ICD-10-CM | POA: Diagnosis not present

## 2022-04-07 DIAGNOSIS — Z23 Encounter for immunization: Secondary | ICD-10-CM | POA: Insufficient documentation

## 2022-04-07 DIAGNOSIS — I10 Essential (primary) hypertension: Secondary | ICD-10-CM | POA: Diagnosis not present

## 2022-04-07 DIAGNOSIS — K529 Noninfective gastroenteritis and colitis, unspecified: Secondary | ICD-10-CM

## 2022-04-07 DIAGNOSIS — M1712 Unilateral primary osteoarthritis, left knee: Secondary | ICD-10-CM | POA: Diagnosis not present

## 2022-04-07 DIAGNOSIS — Z96652 Presence of left artificial knee joint: Secondary | ICD-10-CM | POA: Diagnosis not present

## 2022-04-07 DIAGNOSIS — Z471 Aftercare following joint replacement surgery: Secondary | ICD-10-CM | POA: Diagnosis not present

## 2022-04-07 DIAGNOSIS — K5730 Diverticulosis of large intestine without perforation or abscess without bleeding: Secondary | ICD-10-CM

## 2022-04-07 DIAGNOSIS — E782 Mixed hyperlipidemia: Secondary | ICD-10-CM

## 2022-04-07 DIAGNOSIS — R011 Cardiac murmur, unspecified: Secondary | ICD-10-CM

## 2022-04-07 HISTORY — PX: KNEE ARTHROPLASTY: SHX992

## 2022-04-07 SURGERY — ARTHROPLASTY, KNEE, TOTAL, USING IMAGELESS COMPUTER-ASSISTED NAVIGATION
Anesthesia: Spinal | Site: Knee | Laterality: Left

## 2022-04-07 MED ORDER — MAGNESIUM HYDROXIDE 400 MG/5ML PO SUSP
30.0000 mL | Freq: Every day | ORAL | Status: DC
  Administered 2022-04-07 – 2022-04-08 (×2): 30 mL via ORAL
  Filled 2022-04-07 (×2): qty 30

## 2022-04-07 MED ORDER — SODIUM CHLORIDE 0.9 % IV SOLN: INTRAVENOUS | Status: DC

## 2022-04-07 MED ORDER — PHENOL 1.4 % MT LIQD: 1.0000 | OROMUCOSAL | Status: DC | PRN

## 2022-04-07 MED ORDER — SODIUM CHLORIDE 0.9 % IV SOLN
INTRAVENOUS | Status: DC | PRN
  Administered 2022-04-07: 60 mL

## 2022-04-07 MED ORDER — ONDANSETRON HCL 4 MG/2ML IJ SOLN
INTRAMUSCULAR | Status: DC | PRN
  Administered 2022-04-07: 4 mg via INTRAVENOUS

## 2022-04-07 MED ORDER — CHLORHEXIDINE GLUCONATE 0.12 % MT SOLN: 15.0000 mL | Freq: Once | OROMUCOSAL | Status: AC

## 2022-04-07 MED ORDER — FAMOTIDINE 20 MG PO TABS
20.0000 mg | ORAL_TABLET | Freq: Once | ORAL | Status: AC
  Administered 2022-04-07: 20 mg via ORAL

## 2022-04-07 MED ORDER — OXYCODONE HCL 5 MG PO TABS: 5.0000 mg | ORAL_TABLET | Freq: Once | ORAL | Status: DC | PRN

## 2022-04-07 MED ORDER — PROPOFOL 10 MG/ML IV BOLUS
INTRAVENOUS | Status: DC | PRN
  Administered 2022-04-07 (×2): 20 mg via INTRAVENOUS

## 2022-04-07 MED ORDER — PROPOFOL 10 MG/ML IV BOLUS
INTRAVENOUS | Status: AC
  Filled 2022-04-07: qty 20

## 2022-04-07 MED ORDER — PROPOFOL 1000 MG/100ML IV EMUL
INTRAVENOUS | Status: AC
  Filled 2022-04-07: qty 100

## 2022-04-07 MED ORDER — CEFAZOLIN SODIUM-DEXTROSE 2-4 GM/100ML-% IV SOLN
2.0000 g | Freq: Four times a day (QID) | INTRAVENOUS | Status: AC
  Administered 2022-04-07 (×2): 2 g via INTRAVENOUS
  Filled 2022-04-07 (×2): qty 100

## 2022-04-07 MED ORDER — FENTANYL CITRATE (PF) 100 MCG/2ML IJ SOLN: 25.0000 ug | INTRAMUSCULAR | Status: DC | PRN

## 2022-04-07 MED ORDER — CHLORHEXIDINE GLUCONATE 0.12 % MT SOLN
OROMUCOSAL | Status: AC
  Administered 2022-04-07: 15 mL via OROMUCOSAL
  Filled 2022-04-07: qty 15

## 2022-04-07 MED ORDER — ORAL CARE MOUTH RINSE: 15.0000 mL | Freq: Once | OROMUCOSAL | Status: AC

## 2022-04-07 MED ORDER — FAMOTIDINE 20 MG PO TABS
ORAL_TABLET | ORAL | Status: AC
  Filled 2022-04-07: qty 1

## 2022-04-07 MED ORDER — GLYCOPYRROLATE 0.2 MG/ML IJ SOLN
INTRAMUSCULAR | Status: DC | PRN
  Administered 2022-04-07: .2 mg via INTRAVENOUS

## 2022-04-07 MED ORDER — BUPIVACAINE HCL (PF) 0.25 % IJ SOLN
INTRAMUSCULAR | Status: DC | PRN
  Administered 2022-04-07: 60 mL

## 2022-04-07 MED ORDER — DIPHENHYDRAMINE HCL 12.5 MG/5ML PO ELIX: 12.5000 mg | ORAL_SOLUTION | ORAL | Status: DC | PRN

## 2022-04-07 MED ORDER — DEXAMETHASONE SODIUM PHOSPHATE 10 MG/ML IJ SOLN
INTRAMUSCULAR | Status: AC
  Administered 2022-04-07: 8 mg via INTRAVENOUS
  Filled 2022-04-07: qty 1

## 2022-04-07 MED ORDER — DEXAMETHASONE SODIUM PHOSPHATE 10 MG/ML IJ SOLN: 8.0000 mg | Freq: Once | INTRAMUSCULAR | Status: DC

## 2022-04-07 MED ORDER — ACETAMINOPHEN 325 MG PO TABS: 325.0000 mg | ORAL_TABLET | Freq: Four times a day (QID) | ORAL | Status: DC | PRN

## 2022-04-07 MED ORDER — ENSURE PRE-SURGERY PO LIQD
296.0000 mL | Freq: Once | ORAL | Status: AC
  Administered 2022-04-07: 296 mL via ORAL
  Filled 2022-04-07: qty 296

## 2022-04-07 MED ORDER — ACETAMINOPHEN 10 MG/ML IV SOLN
INTRAVENOUS | Status: DC | PRN
  Administered 2022-04-07: 1000 mg via INTRAVENOUS

## 2022-04-07 MED ORDER — CELECOXIB 200 MG PO CAPS
200.0000 mg | ORAL_CAPSULE | Freq: Two times a day (BID) | ORAL | Status: DC
  Administered 2022-04-07 – 2022-04-08 (×2): 200 mg via ORAL
  Filled 2022-04-07 (×2): qty 1

## 2022-04-07 MED ORDER — ACETAMINOPHEN 10 MG/ML IV SOLN
1000.0000 mg | Freq: Four times a day (QID) | INTRAVENOUS | Status: DC
  Administered 2022-04-07 – 2022-04-08 (×2): 1000 mg via INTRAVENOUS
  Filled 2022-04-07 (×3): qty 100

## 2022-04-07 MED ORDER — BUPIVACAINE HCL (PF) 0.5 % IJ SOLN
INTRAMUSCULAR | Status: DC | PRN
  Administered 2022-04-07: 3 mL via INTRATHECAL

## 2022-04-07 MED ORDER — CELECOXIB 200 MG PO CAPS
ORAL_CAPSULE | ORAL | Status: AC
  Administered 2022-04-07: 400 mg
  Filled 2022-04-07: qty 2

## 2022-04-07 MED ORDER — CEFAZOLIN SODIUM-DEXTROSE 2-3 GM-%(50ML) IV SOLR
INTRAVENOUS | Status: DC | PRN
  Administered 2022-04-07: 2 g via INTRAVENOUS

## 2022-04-07 MED ORDER — PANTOPRAZOLE SODIUM 40 MG PO TBEC
40.0000 mg | DELAYED_RELEASE_TABLET | Freq: Two times a day (BID) | ORAL | Status: DC
  Administered 2022-04-07 – 2022-04-08 (×2): 40 mg via ORAL
  Filled 2022-04-07 (×2): qty 1

## 2022-04-07 MED ORDER — MIDAZOLAM HCL 2 MG/2ML IJ SOLN
INTRAMUSCULAR | Status: AC
  Filled 2022-04-07: qty 2

## 2022-04-07 MED ORDER — GABAPENTIN 300 MG PO CAPS
ORAL_CAPSULE | ORAL | Status: AC
  Administered 2022-04-07: 300 mg
  Filled 2022-04-07: qty 1

## 2022-04-07 MED ORDER — TRANEXAMIC ACID-NACL 1000-0.7 MG/100ML-% IV SOLN
INTRAVENOUS | Status: AC
  Filled 2022-04-07: qty 100

## 2022-04-07 MED ORDER — TRANEXAMIC ACID-NACL 1000-0.7 MG/100ML-% IV SOLN: 1000.0000 mg | INTRAVENOUS | Status: DC

## 2022-04-07 MED ORDER — ALUM & MAG HYDROXIDE-SIMETH 200-200-20 MG/5ML PO SUSP: 30.0000 mL | ORAL | Status: DC | PRN

## 2022-04-07 MED ORDER — MENTHOL 3 MG MT LOZG: 1.0000 | LOZENGE | OROMUCOSAL | Status: DC | PRN

## 2022-04-07 MED ORDER — TRANEXAMIC ACID-NACL 1000-0.7 MG/100ML-% IV SOLN
INTRAVENOUS | Status: AC
  Administered 2022-04-07: 1000 mg via INTRAVENOUS
  Filled 2022-04-07: qty 100

## 2022-04-07 MED ORDER — HYDROCHLOROTHIAZIDE 12.5 MG PO TABS
12.5000 mg | ORAL_TABLET | Freq: Every day | ORAL | Status: DC
  Administered 2022-04-07 – 2022-04-08 (×2): 12.5 mg via ORAL
  Filled 2022-04-07 (×2): qty 1

## 2022-04-07 MED ORDER — FLEET ENEMA 7-19 GM/118ML RE ENEM: 1.0000 | ENEMA | Freq: Once | RECTAL | Status: DC | PRN

## 2022-04-07 MED ORDER — OXYCODONE HCL 5 MG PO TABS
10.0000 mg | ORAL_TABLET | ORAL | Status: DC | PRN
  Filled 2022-04-07: qty 2

## 2022-04-07 MED ORDER — LACTATED RINGERS IV SOLN: INTRAVENOUS | Status: DC

## 2022-04-07 MED ORDER — OXYCODONE HCL 5 MG PO TABS
5.0000 mg | ORAL_TABLET | ORAL | Status: DC | PRN
  Administered 2022-04-07 – 2022-04-08 (×2): 5 mg via ORAL

## 2022-04-07 MED ORDER — CEFAZOLIN SODIUM-DEXTROSE 2-4 GM/100ML-% IV SOLN
INTRAVENOUS | Status: AC
  Filled 2022-04-07: qty 100

## 2022-04-07 MED ORDER — CHLORHEXIDINE GLUCONATE 4 % EX LIQD: 60.0000 mL | Freq: Once | CUTANEOUS | Status: DC

## 2022-04-07 MED ORDER — MIDAZOLAM HCL 5 MG/5ML IJ SOLN
INTRAMUSCULAR | Status: DC | PRN
  Administered 2022-04-07: 2 mg via INTRAVENOUS

## 2022-04-07 MED ORDER — SODIUM CHLORIDE 0.9 % IR SOLN
Status: DC | PRN
  Administered 2022-04-07: 3000 mL

## 2022-04-07 MED ORDER — ACETAMINOPHEN 10 MG/ML IV SOLN: 1000.0000 mg | Freq: Once | INTRAVENOUS | Status: DC | PRN

## 2022-04-07 MED ORDER — CELECOXIB 200 MG PO CAPS: 400.0000 mg | ORAL_CAPSULE | Freq: Once | ORAL | Status: DC

## 2022-04-07 MED ORDER — METOCLOPRAMIDE HCL 5 MG PO TABS
10.0000 mg | ORAL_TABLET | Freq: Three times a day (TID) | ORAL | Status: DC
  Administered 2022-04-07 – 2022-04-08 (×3): 10 mg via ORAL
  Filled 2022-04-07 (×3): qty 2

## 2022-04-07 MED ORDER — ENOXAPARIN SODIUM 30 MG/0.3ML IJ SOSY
30.0000 mg | PREFILLED_SYRINGE | Freq: Two times a day (BID) | INTRAMUSCULAR | Status: DC
  Administered 2022-04-08: 30 mg via SUBCUTANEOUS
  Filled 2022-04-07: qty 0.3

## 2022-04-07 MED ORDER — TRANEXAMIC ACID-NACL 1000-0.7 MG/100ML-% IV SOLN
INTRAVENOUS | Status: DC | PRN
  Administered 2022-04-07: 1000 mg via INTRAVENOUS

## 2022-04-07 MED ORDER — SENNOSIDES-DOCUSATE SODIUM 8.6-50 MG PO TABS
1.0000 | ORAL_TABLET | Freq: Two times a day (BID) | ORAL | Status: DC
  Administered 2022-04-07 – 2022-04-08 (×2): 1 via ORAL
  Filled 2022-04-07 (×2): qty 1

## 2022-04-07 MED ORDER — TRANEXAMIC ACID-NACL 1000-0.7 MG/100ML-% IV SOLN: 1000.0000 mg | Freq: Once | INTRAVENOUS | Status: AC

## 2022-04-07 MED ORDER — CELECOXIB 200 MG PO CAPS
ORAL_CAPSULE | ORAL | Status: AC
  Filled 2022-04-07: qty 1

## 2022-04-07 MED ORDER — FERROUS SULFATE 325 (65 FE) MG PO TABS
325.0000 mg | ORAL_TABLET | Freq: Two times a day (BID) | ORAL | Status: DC
  Administered 2022-04-07 – 2022-04-08 (×2): 325 mg via ORAL
  Filled 2022-04-07 (×2): qty 1

## 2022-04-07 MED ORDER — ONDANSETRON HCL 4 MG PO TABS: 4.0000 mg | ORAL_TABLET | Freq: Four times a day (QID) | ORAL | Status: DC | PRN

## 2022-04-07 MED ORDER — TRAMADOL HCL 50 MG PO TABS: 50.0000 mg | ORAL_TABLET | ORAL | Status: DC | PRN

## 2022-04-07 MED ORDER — GABAPENTIN 300 MG PO CAPS: 300.0000 mg | ORAL_CAPSULE | Freq: Once | ORAL | Status: DC

## 2022-04-07 MED ORDER — PHENYLEPHRINE HCL-NACL 20-0.9 MG/250ML-% IV SOLN
INTRAVENOUS | Status: DC | PRN
  Administered 2022-04-07: 40 ug/min via INTRAVENOUS

## 2022-04-07 MED ORDER — INFLUENZA VAC A&B SA ADJ QUAD 0.5 ML IM PRSY
0.5000 mL | PREFILLED_SYRINGE | INTRAMUSCULAR | Status: AC
  Administered 2022-04-08: 0.5 mL via INTRAMUSCULAR
  Filled 2022-04-07: qty 0.5

## 2022-04-07 MED ORDER — SURGIPHOR WOUND IRRIGATION SYSTEM - OPTIME
TOPICAL | Status: DC | PRN
  Administered 2022-04-07: 450 mL via TOPICAL

## 2022-04-07 MED ORDER — OXYCODONE HCL 5 MG/5ML PO SOLN: 5.0000 mg | Freq: Once | ORAL | Status: DC | PRN

## 2022-04-07 MED ORDER — CEFAZOLIN SODIUM-DEXTROSE 2-4 GM/100ML-% IV SOLN: 2.0000 g | INTRAVENOUS | Status: DC

## 2022-04-07 MED ORDER — PHENYLEPHRINE 80 MCG/ML (10ML) SYRINGE FOR IV PUSH (FOR BLOOD PRESSURE SUPPORT)
PREFILLED_SYRINGE | INTRAVENOUS | Status: DC | PRN
  Administered 2022-04-07: 80 ug via INTRAVENOUS

## 2022-04-07 MED ORDER — LISINOPRIL 10 MG PO TABS
10.0000 mg | ORAL_TABLET | Freq: Every day | ORAL | Status: DC
  Administered 2022-04-07 – 2022-04-08 (×2): 10 mg via ORAL
  Filled 2022-04-07 (×2): qty 1

## 2022-04-07 MED ORDER — ONDANSETRON HCL 4 MG/2ML IJ SOLN: 4.0000 mg | Freq: Four times a day (QID) | INTRAMUSCULAR | Status: DC | PRN

## 2022-04-07 MED ORDER — LISINOPRIL-HYDROCHLOROTHIAZIDE 10-12.5 MG PO TABS: 1.0000 | ORAL_TABLET | Freq: Every day | ORAL | Status: DC

## 2022-04-07 MED ORDER — BISACODYL 10 MG RE SUPP: 10.0000 mg | Freq: Every day | RECTAL | Status: DC | PRN

## 2022-04-07 MED ORDER — ADULT MULTIVITAMIN W/MINERALS CH
1.0000 | ORAL_TABLET | Freq: Every day | ORAL | Status: DC
  Administered 2022-04-07 – 2022-04-08 (×2): 1 via ORAL
  Filled 2022-04-07 (×2): qty 1

## 2022-04-07 MED ORDER — HYDROMORPHONE HCL 1 MG/ML IJ SOLN
0.5000 mg | INTRAMUSCULAR | Status: DC | PRN
  Administered 2022-04-07: 1 mg via INTRAVENOUS
  Filled 2022-04-07: qty 1

## 2022-04-07 MED ORDER — ONDANSETRON HCL 4 MG/2ML IJ SOLN: 4.0000 mg | Freq: Once | INTRAMUSCULAR | Status: DC | PRN

## 2022-04-07 MED ORDER — PROPOFOL 500 MG/50ML IV EMUL
INTRAVENOUS | Status: DC | PRN
  Administered 2022-04-07: 100 ug/kg/min via INTRAVENOUS

## 2022-04-07 SURGICAL SUPPLY — 72 items
ATTUNE MED DOME PAT 38 KNEE (Knees) IMPLANT
ATTUNE PS FEM LT SZ 5 CEM KNEE (Femur) IMPLANT
BASE TIBIA ATTUNE KNEE SYS SZ6 (Knees) IMPLANT
BATTERY INSTRU NAVIGATION (MISCELLANEOUS) ×4 IMPLANT
BLADE SAW 70X12.5 (BLADE) ×1 IMPLANT
BLADE SAW 90X13X1.19 OSCILLAT (BLADE) ×1 IMPLANT
BLADE SAW 90X25X1.19 OSCILLAT (BLADE) ×1 IMPLANT
CEMENT HV SMART SET (Cement) IMPLANT
COOLER POLAR GLACIER W/PUMP (MISCELLANEOUS) ×1 IMPLANT
CUFF TOURN SGL QUICK 24 (TOURNIQUET CUFF)
CUFF TOURN SGL QUICK 34 (TOURNIQUET CUFF)
CUFF TRNQT CYL 24X4X16.5-23 (TOURNIQUET CUFF) IMPLANT
CUFF TRNQT CYL 34X4.125X (TOURNIQUET CUFF) IMPLANT
DRAPE 3/4 80X56 (DRAPES) ×1 IMPLANT
DRAPE INCISE IOBAN 66X45 STRL (DRAPES) IMPLANT
DRSG DERMACEA 8X12 NADH (GAUZE/BANDAGES/DRESSINGS) IMPLANT
DRSG DERMACEA NONADH 3X8 (GAUZE/BANDAGES/DRESSINGS) ×1 IMPLANT
DRSG MEPILEX SACRM 8.7X9.8 (GAUZE/BANDAGES/DRESSINGS) ×1 IMPLANT
DRSG OPSITE POSTOP 4X14 (GAUZE/BANDAGES/DRESSINGS) ×1 IMPLANT
DRSG TEGADERM 4X4.75 (GAUZE/BANDAGES/DRESSINGS) ×1 IMPLANT
DURAPREP 26ML APPLICATOR (WOUND CARE) ×2 IMPLANT
ELECT CAUTERY BLADE 6.4 (BLADE) ×1 IMPLANT
ELECT REM PT RETURN 9FT ADLT (ELECTROSURGICAL) ×1
ELECTRODE REM PT RTRN 9FT ADLT (ELECTROSURGICAL) ×1 IMPLANT
EX-PIN ORTHOLOCK NAV 4X150 (PIN) ×2 IMPLANT
GLOVE BIOGEL M STRL SZ7.5 (GLOVE) ×2 IMPLANT
GLOVE BIOGEL PI IND STRL 7.5 (GLOVE) ×1 IMPLANT
GLOVE PI ORTHO PRO STRL 7.5 (GLOVE) ×2 IMPLANT
GLOVE SURG UNDER POLY LF SZ7.5 (GLOVE) ×1 IMPLANT
GOWN STRL REUS W/ TWL LRG LVL3 (GOWN DISPOSABLE) ×2 IMPLANT
GOWN STRL REUS W/ TWL XL LVL3 (GOWN DISPOSABLE) ×1 IMPLANT
GOWN STRL REUS W/TWL LRG LVL3 (GOWN DISPOSABLE) ×2
GOWN STRL REUS W/TWL XL LVL3 (GOWN DISPOSABLE) ×1
HEMOVAC 400CC 10FR (MISCELLANEOUS) ×1 IMPLANT
HOLDER FOLEY CATH W/STRAP (MISCELLANEOUS) ×1 IMPLANT
HOOD PEEL AWAY T7 (MISCELLANEOUS) ×2 IMPLANT
INSERT TIBIAL KNEE SZ 5 12MM (Insert) IMPLANT
IV NS IRRIG 3000ML ARTHROMATIC (IV SOLUTION) ×1 IMPLANT
KIT TURNOVER KIT A (KITS) ×1 IMPLANT
KNIFE SCULPS 14X20 (INSTRUMENTS) ×1 IMPLANT
MANIFOLD NEPTUNE II (INSTRUMENTS) ×2 IMPLANT
NDL SPNL 20GX3.5 QUINCKE YW (NEEDLE) ×2 IMPLANT
NEEDLE SPNL 20GX3.5 QUINCKE YW (NEEDLE) ×2 IMPLANT
PACK TOTAL KNEE (MISCELLANEOUS) ×1 IMPLANT
PAD ABD DERMACEA PRESS 5X9 (GAUZE/BANDAGES/DRESSINGS) ×2 IMPLANT
PAD WRAPON POLAR KNEE (MISCELLANEOUS) ×1 IMPLANT
PIN DRILL FIX HALF THREAD (BIT) ×2 IMPLANT
PIN DRILL QUICK PACK ×2 IMPLANT
PIN FIXATION 1/8DIA X 3INL (PIN) ×1 IMPLANT
PULSAVAC PLUS IRRIG FAN TIP (DISPOSABLE) ×1
SOL PREP PVP 2OZ (MISCELLANEOUS) ×1
SOLUTION IRRIG SURGIPHOR (IV SOLUTION) ×1 IMPLANT
SOLUTION PREP PVP 2OZ (MISCELLANEOUS) ×1 IMPLANT
SPONGE DRAIN TRACH 4X4 STRL 2S (GAUZE/BANDAGES/DRESSINGS) ×1 IMPLANT
STAPLER SKIN PROX 35W (STAPLE) ×1 IMPLANT
STOCKINETTE IMPERV 14X48 (MISCELLANEOUS) IMPLANT
STRAP TIBIA SHORT (MISCELLANEOUS) ×1 IMPLANT
SUCTION FRAZIER HANDLE 10FR (MISCELLANEOUS) ×1
SUCTION TUBE FRAZIER 10FR DISP (MISCELLANEOUS) ×1 IMPLANT
SUT VIC AB 0 CT1 36 (SUTURE) ×2 IMPLANT
SUT VIC AB 1 CT1 36 (SUTURE) ×2 IMPLANT
SUT VIC AB 2-0 CT2 27 (SUTURE) ×1 IMPLANT
SYR 30ML LL (SYRINGE) ×2 IMPLANT
TIBIA ATTUNE KNEE SYS BASE SZ6 (Knees) ×1 IMPLANT
TIBIAL INSERT KNEE SZ 5 12MM (Insert) ×1 IMPLANT
TIP FAN IRRIG PULSAVAC PLUS (DISPOSABLE) ×1 IMPLANT
TOWEL OR 17X26 4PK STRL BLUE (TOWEL DISPOSABLE) IMPLANT
TOWER CARTRIDGE SMART MIX (DISPOSABLE) ×1 IMPLANT
TRAP FLUID SMOKE EVACUATOR (MISCELLANEOUS) ×1 IMPLANT
TRAY FOLEY MTR SLVR 16FR STAT (SET/KITS/TRAYS/PACK) ×1 IMPLANT
WATER STERILE IRR 1000ML POUR (IV SOLUTION) ×1 IMPLANT
WRAPON POLAR PAD KNEE (MISCELLANEOUS) ×1

## 2022-04-07 NOTE — Anesthesia Procedure Notes (Signed)
Spinal  Patient location during procedure: OR Start time: 04/07/2022 11:10 AM End time: 04/07/2022 11:12 AM Reason for block: surgical anesthesia Staffing Performed: resident/CRNA  Anesthesiologist: Arita Miss, MD Resident/CRNA: Norm Salt, CRNA Performed by: Lily Peer, Nygeria Lager, CRNA Authorized by: Arita Miss, MD   Preanesthetic Checklist Completed: patient identified, IV checked, site marked, risks and benefits discussed, surgical consent, monitors and equipment checked, pre-op evaluation and timeout performed Spinal Block Patient position: sitting Prep: ChloraPrep Patient monitoring: heart rate, continuous pulse ox and blood pressure Approach: midline Location: L3-4 Injection technique: single-shot Needle Needle type: Pencan  Needle gauge: 24 G Needle length: 10 cm Assessment Events: CSF return Additional Notes IV functioning, monitors applied to pt. Expiration date of kit checked and confirmed to be in date. Sterile prep and drape, hand hygiene and sterile gloved used. Pt was positioned and spine was prepped in sterile fashion. Skin was anesthetized with lidocaine. Free flow of clear CSF obtained prior to injecting local anesthetic into CSF x 1 attempt. Spinal needle aspirated freely following injection. Needle was carefully withdrawn, and pt tolerated procedure well. Loss of motor and sensory on exam post injection.

## 2022-04-07 NOTE — Anesthesia Preprocedure Evaluation (Signed)
Anesthesia Evaluation  Patient identified by MRN, date of birth, ID band Patient awake    Reviewed: Allergy & Precautions, NPO status , Patient's Chart, lab work & pertinent test results  History of Anesthesia Complications Negative for: history of anesthetic complications  Airway Mallampati: II  TM Distance: >3 FB Neck ROM: Full    Dental no notable dental hx. (+) Teeth Intact   Pulmonary neg pulmonary ROS, neg sleep apnea, neg COPD, Patient abstained from smoking.Not current smoker   Pulmonary exam normal breath sounds clear to auscultation       Cardiovascular Exercise Tolerance: Good METShypertension, Pt. on medications (-) CAD and (-) Past MI (-) dysrhythmias  Rhythm:Regular Rate:Normal - Systolic murmurs    Neuro/Psych negative neurological ROS  negative psych ROS   GI/Hepatic hiatal hernia,GERD  Controlled,,(+)     (-) substance abuse    Endo/Other  neg diabetes    Renal/GU negative Renal ROS     Musculoskeletal  (+) Arthritis ,    Abdominal   Peds  Hematology   Anesthesia Other Findings Past Medical History: No date: Dysrhythmia No date: GERD (gastroesophageal reflux disease) No date: Heart murmur No date: Heart valve problem No date: History of hiatal hernia No date: Hypertension 2012: Prolapse of uterus 2012: Stress incontinence  Reproductive/Obstetrics                             Anesthesia Physical Anesthesia Plan  ASA: 2  Anesthesia Plan: Spinal   Post-op Pain Management: Celebrex PO (pre-op)*, Gabapentin PO (pre-op)* and Ofirmev IV (intra-op)*   Induction: Intravenous  PONV Risk Score and Plan: 2 and Ondansetron, Dexamethasone, Propofol infusion, TIVA and Midazolam  Airway Management Planned: Natural Airway  Additional Equipment: None  Intra-op Plan:   Post-operative Plan:   Informed Consent: I have reviewed the patients History and Physical, chart,  labs and discussed the procedure including the risks, benefits and alternatives for the proposed anesthesia with the patient or authorized representative who has indicated his/her understanding and acceptance.       Plan Discussed with: CRNA and Surgeon  Anesthesia Plan Comments: (Discussed R/B/A of neuraxial anesthesia technique with patient: - rare risks of spinal/epidural hematoma, nerve damage, infection - Risk of PDPH - Risk of nausea and vomiting - Risk of conversion to general anesthesia and its associated risks, including sore throat, damage to lips/eyes/teeth/oropharynx, and rare risks such as cardiac and respiratory events. - Risk of allergic reactions  Discussed the role of CRNA in patient's perioperative care.  Patient voiced understanding.)       Anesthesia Quick Evaluation

## 2022-04-07 NOTE — Anesthesia Procedure Notes (Signed)
Date/Time: 04/07/2022 11:27 AM  Performed by: Lily Peer, Nakoa Ganus, CRNAPre-anesthesia Checklist: Patient identified, Emergency Drugs available, Suction available, Patient being monitored and Timeout performed Patient Re-evaluated:Patient Re-evaluated prior to induction Oxygen Delivery Method: Simple face mask Induction Type: IV induction

## 2022-04-07 NOTE — Transfer of Care (Signed)
Immediate Anesthesia Transfer of Care Note  Patient: Kelly Nguyen  Procedure(s) Performed: COMPUTER ASSISTED TOTAL KNEE ARTHROPLASTY (Left: Knee)  Patient Location: PACU  Anesthesia Type:Spinal  Level of Consciousness: drowsy  Airway & Oxygen Therapy: Patient Spontanous Breathing and Patient connected to face mask oxygen  Post-op Assessment: Report given to RN and Post -op Vital signs reviewed and stable  Post vital signs: Reviewed and stable  Last Vitals:  Vitals Value Taken Time  BP 109/68   Temp    Pulse 60   Resp 14   SpO2 98     Last Pain:  Vitals:   04/07/22 0936  TempSrc: Temporal  PainSc: 0-No pain      Patients Stated Pain Goal: 0 (62/83/15 1761)  Complications: No notable events documented.

## 2022-04-07 NOTE — Op Note (Signed)
OPERATIVE NOTE  DATE OF SURGERY:  04/07/2022  PATIENT NAME:  Kelly Nguyen   DOB: 09/29/1955  MRN: [id number]  PRE-OPERATIVE DIAGNOSIS: Degenerative arthrosis of the left knee, primary  POST-OPERATIVE DIAGNOSIS:  Same  PROCEDURE:  Left total knee arthroplasty using computer-assisted navigation  SURGEON:  Marciano Sequin. M.D.  ASSISTANT: Cassell Smiles, PA-C (present and scrubbed throughout the case, critical for assistance with exposure, retraction, instrumentation, and closure)  ANESTHESIA: spinal  ESTIMATED BLOOD LOSS: 50 mL  FLUIDS REPLACED: 1000 mL of crystalloid  TOURNIQUET TIME: 80 minutes  DRAINS: 2 medium Hemovac drains  SOFT TISSUE RELEASES: Anterior cruciate ligament, posterior cruciate ligament, deep medial collateral ligament, patellofemoral ligament  IMPLANTS UTILIZED: DePuy Attune size 5 posterior stabilized femoral component (cemented), size 6 rotating platform tibial component (cemented), 38 mm medialized dome patella (cemented), and a 12 mm stabilized rotating platform polyethylene insert.  INDICATIONS FOR SURGERY: Kelly Nguyen is a 66 y.o. year old female with a long history of progressive knee pain. X-rays demonstrated severe degenerative changes in tricompartmental fashion. The patient had not seen any significant improvement despite conservative nonsurgical intervention. After discussion of the risks and benefits of surgical intervention, the patient expressed understanding of the risks benefits and agree with plans for total knee arthroplasty.   The risks, benefits, and alternatives were discussed at length including but not limited to the risks of infection, bleeding, nerve injury, stiffness, blood clots, the need for revision surgery, cardiopulmonary complications, among others, and they were willing to proceed.  PROCEDURE IN DETAIL: The patient was brought into the operating room and, after adequate spinal anesthesia was achieved, a tourniquet was placed on the  patient's upper thigh. The patient's knee and leg were cleaned and prepped with alcohol and DuraPrep and draped in the usual sterile fashion. A "timeout" was performed as per usual protocol. The lower extremity was exsanguinated using an Esmarch, and the tourniquet was inflated to 300 mmHg. An anterior longitudinal incision was made followed by a standard mid vastus approach. The deep fibers of the medial collateral ligament were elevated in a subperiosteal fashion off of the medial flare of the tibia so as to maintain a continuous soft tissue sleeve. The patella was subluxed laterally and the patellofemoral ligament was incised. Inspection of the knee demonstrated severe degenerative changes with full-thickness loss of articular cartilage. Osteophytes were debrided using a rongeur. Anterior and posterior cruciate ligaments were excised. Two 4.0 mm Schanz pins were inserted in the femur and into the tibia for attachment of the array of trackers used for computer-assisted navigation. Hip center was identified using a circumduction technique. Distal landmarks were mapped using the computer. The distal femur and proximal tibia were mapped using the computer. The distal femoral cutting guide was positioned using computer-assisted navigation so as to achieve a 5 distal valgus cut. The femur was sized and it was felt that a size 5 femoral component was appropriate. A size 5 femoral cutting guide was positioned and the anterior cut was performed and verified using the computer. This was followed by completion of the posterior and chamfer cuts. Femoral cutting guide for the central box was then positioned in the center box cut was performed.  Attention was then directed to the proximal tibia. Medial and lateral menisci were excised. The extramedullary tibial cutting guide was positioned using computer-assisted navigation so as to achieve a 0 varus-valgus alignment and 3 posterior slope. The cut was performed and  verified using the computer. The proximal tibia was sized and  it was felt that a size 6 tibial tray was appropriate. Tibial and femoral trials were inserted followed by insertion of a 12 mm polyethylene insert. This allowed for excellent mediolateral soft tissue balancing both in flexion and in full extension. Finally, the patella was cut and prepared so as to accommodate a 38 mm medialized dome patella. A patella trial was placed and the knee was placed through a range of motion with excellent patellar tracking appreciated. The femoral trial was removed after debridement of posterior osteophytes. The central post-hole for the tibial component was reamed followed by insertion of a keel punch. Tibial trials were then removed. Cut surfaces of bone were irrigated with copious amounts of normal saline using pulsatile lavage and then suctioned dry. Polymethylmethacrylate cement was prepared in the usual fashion using a vacuum mixer. Cement was applied to the cut surface of the proximal tibia as well as along the undersurface of a size 6 rotating platform tibial component. Tibial component was positioned and impacted into place. Excess cement was removed using [employer] fast streamer. Cement was then applied to the cut surfaces of the femur as well as along the posterior flanges of the size 5 femoral component. The femoral component was positioned and impacted into place. Excess cement was removed using [employer] fast streamer. A 12 mm polyethylene trial was inserted and the knee was brought into full extension with steady axial compression applied. Finally, cement was applied to the backside of a 38 mm medialized dome patella and the patellar component was positioned and patellar clamp applied. Excess cement was removed using [employer] fast streamer. After adequate curing of the cement, the tourniquet was deflated after a total tourniquet time of 80 minutes. Hemostasis was achieved using electrocautery. The knee was irrigated with copious  amounts of normal saline using pulsatile lavage followed by 450 ml of Surgiphor and then suctioned dry. 20 mL of 1.3% Exparel and 60 mL of 0.25% Marcaine in 40 mL of normal saline was injected along the posterior capsule, medial and lateral gutters, and along the arthrotomy site. A 12 mm stabilized rotating platform polyethylene insert was inserted and the knee was placed through a range of motion with excellent mediolateral soft tissue balancing appreciated and excellent patellar tracking noted. 2 medium drains were placed in the wound bed and brought out through separate stab incisions. The medial parapatellar portion of the incision was reapproximated using interrupted sutures of #1 Vicryl. Subcutaneous tissue was approximated in layers using first #0 Vicryl followed #2-0 Vicryl. The skin was approximated with skin staples. A sterile dressing was applied.  The patient tolerated the procedure well and was transported to the recovery room in stable condition.    Kelly Nguyen., M.D.

## 2022-04-07 NOTE — Interval H&P Note (Signed)
History and Physical Interval Note:  04/07/2022 10:46 AM  Kelly Nguyen  has presented today for surgery, with the diagnosis of PRIMARY OSTEOARTHRITIS LEFT KNEE.  The various methods of treatment have been discussed with the patient and family. After consideration of risks, benefits and other options for treatment, the patient has consented to  Procedure(s): COMPUTER ASSISTED TOTAL KNEE ARTHROPLASTY (Left) as a surgical intervention.  The patient's history has been reviewed, patient examined, no change in status, stable for surgery.  I have reviewed the patient's chart and labs.  Questions were answered to the patient's satisfaction.     [place]

## 2022-04-07 NOTE — Discharge Summary (Signed)
Physician Discharge Summary  Patient ID: Kelly Nguyen MRN: [id number] DOB/AGE: September 21, 1955 66 y.o.  Admit date: 04/07/2022 Discharge date: 04/08/2022  Admission Diagnoses:  Total knee replacement status, left [Z96.652]  Surgeries:Procedure(s): Left total knee arthroplasty using computer-assisted navigation   SURGEON:  Marciano Sequin. M.D.   ASSISTANT: Cassell Smiles, PA-C (present and scrubbed throughout the case, critical for assistance with exposure, retraction, instrumentation, and closure)   ANESTHESIA: spinal   ESTIMATED BLOOD LOSS: 50 mL   FLUIDS REPLACED: 1000 mL of crystalloid   TOURNIQUET TIME: 80 minutes   DRAINS: 2 medium Hemovac drains   SOFT TISSUE RELEASES: Anterior cruciate ligament, posterior cruciate ligament, deep medial collateral ligament, patellofemoral ligament   IMPLANTS UTILIZED: DePuy Attune size 5 posterior stabilized femoral component (cemented), size 6 rotating platform tibial component (cemented), 38 mm medialized dome patella (cemented), and a 12 mm stabilized rotating platform polyethylene insert.  Discharge Diagnoses: Patient Active Problem List   Diagnosis Date Noted   Total knee replacement status, left 04/07/2022   Primary osteoarthritis of left knee 02/09/2022   Chronic diarrhea 01/22/2022   Mixed hyperlipidemia 07/25/2021   Pseudopolyposis of colon without complication, unspecified part of colon ([place]) 09/26/2019   Status post total right knee replacement 05/27/2017   Lumbar radiculopathy 03/28/2016   S/P hysterectomy 04/10/2015   Uterine prolapse 04/09/2015   Closed fracture of ankle 04/09/2015   Abnormal EKG 04/09/2015   Arthralgia of multiple joints 10/12/2007   Colon, diverticulosis 08/28/2003   Cardiac murmur 08/28/2003   Diaphragmatic hernia 06/22/2003   Essential (primary) hypertension 08/17/2002    Past Medical History:  Diagnosis Date   Dysrhythmia    GERD (gastroesophageal reflux disease)    Heart murmur    Heart valve  problem    History of hiatal hernia    Hypertension    Prolapse of uterus 2012   Stress incontinence 2012     Transfusion:    Consultants (if any):   Discharged Condition: Improved  Hospital Course: Kelly Nguyen is an 66 y.o. female who was admitted 04/07/2022 with a diagnosis of left knee osteoarthritis and went to the operating room on 04/07/2022 and underwent left total knee arthoplasty. The patient received perioperative antibiotics for prophylaxis (see below). The patient tolerated the procedure well and was transported to PACU in stable condition. After meeting PACU criteria, the patient was subsequently transferred to the Orthopaedics/Rehabilitation unit.   The patient received DVT prophylaxis in the form of early mobilization, Lovenox, TED hose, and SCDs . A sacral pad had been placed and heels were elevated off of the bed with rolled towels in order to protect skin integrity. Foley catheter was discontinued on postoperative day #0. Wound drains were discontinued on postoperative day #1. The surgical incision was healing well without signs of infection.  Physical therapy was initiated postoperatively for transfers, gait training, and strengthening. Occupational therapy was initiated for activities of daily living and evaluation for assisted devices. Rehabilitation goals were reviewed in detail with the patient. The patient made steady progress with physical therapy and physical therapy recommended discharge to Home.   The patient achieved the preliminary goals of this hospitalization and was felt to be medically and orthopaedically appropriate for discharge.  She was given perioperative antibiotics:  Anti-infectives (From admission, onward)    Start     Dose/Rate Route Frequency Ordered Stop   04/07/22 1645  ceFAZolin (ANCEF) IVPB 2g/100 mL premix        2 g 200 mL/hr over 30 Minutes Intravenous  Every 6 hours 04/07/22 1549 04/07/22 2247   04/07/22 0927  ceFAZolin (ANCEF) 2-4  GM/100ML-% IVPB       Note to Pharmacy: [place] [facility], [place]: cabinet override      04/07/22 0927 04/07/22 1713   04/07/22 0600  ceFAZolin (ANCEF) IVPB 2g/100 mL premix  Status:  Discontinued        2 g 200 mL/hr over 30 Minutes Intravenous On call to O.R. 04/07/22 7902 04/07/22 0806     .  Recent vital signs:  Vitals:   04/08/22 0005 04/08/22 0814  BP: (!) 89/57 124/67  Pulse: (!) 55 (!) 59  Resp: 20   Temp: 98 F (36.7 C) 97.9 F (36.6 C)  SpO2: 99% 96%    Recent laboratory studies:  No results for input(s): "WBC", "HGB", "HCT", "PLT", "K", "CL", "CO2", "BUN", "CREATININE", "GLUCOSE", "CALCIUM", "LABPT", "INR" in the last 72 hours.  Diagnostic Studies: DG Knee Left Port  Result Date: 04/07/2022 CLINICAL DATA:  Postop EXAM: PORTABLE LEFT KNEE - 1-2 VIEW COMPARISON:  None Available. FINDINGS: Status post left knee replacement with intact hardware and normal alignment. Probable ghost tracks in the distal femur. Suprapatellar drainage catheter. Cutaneous staples and small amount of gas in the soft tissues consistent with recent surgery IMPRESSION: Status post left knee replacement with expected postsurgical change. Electronically Signed   By: Donavan Foil M.D.   On: 04/07/2022 15:20   MM 3D SCREEN BREAST BILATERAL  Result Date: 03/19/2022 CLINICAL DATA:  Screening. EXAM: DIGITAL SCREENING BILATERAL MAMMOGRAM WITH TOMOSYNTHESIS AND CAD TECHNIQUE: Bilateral screening digital craniocaudal and mediolateral oblique mammograms were obtained. Bilateral screening digital breast tomosynthesis was performed. The images were evaluated with computer-aided detection. COMPARISON:  Previous exam(s). ACR Breast Density Category b: There are scattered areas of fibroglandular density. FINDINGS: There are no findings suspicious for malignancy. IMPRESSION: No mammographic evidence of malignancy. A result letter of this screening mammogram will be mailed directly to the patient. RECOMMENDATION: Screening  mammogram in one year. (Code:SM-B-01Y) BI-RADS CATEGORY  1: Negative. Electronically Signed   By: Nolon Nations M.D.   On: 03/19/2022 14:24    Discharge Medications:   Allergies as of 04/08/2022       Reactions   Acyclovir And Related    Meloxicam Rash        Medication List     TAKE these medications    celecoxib 200 MG capsule Commonly known as: CELEBREX Take 1 capsule (200 mg total) by mouth daily. What changed: Another medication with the same name was added. Make sure you understand how and when to take each.   celecoxib 200 MG capsule Commonly known as: CELEBREX Take 1 capsule (200 mg total) by mouth 2 (two) times daily. What changed: You were already taking a medication with the same name, and this prescription was added. Make sure you understand how and when to take each.   enoxaparin 40 MG/0.4ML injection Commonly known as: LOVENOX Inject 0.4 mLs (40 mg total) into the skin daily for 14 days.   lisinopril-hydrochlorothiazide 10-12.5 MG tablet Commonly known as: ZESTORETIC Take 1 tablet by mouth daily.   multivitamin with minerals tablet Take 1 tablet by mouth daily.   oxyCODONE 5 MG immediate release tablet Commonly known as: Oxy IR/ROXICODONE Take 1 tablet (5 mg total) by mouth every 4 (four) hours as needed for severe pain.   traMADol 50 MG tablet Commonly known as: ULTRAM Take 1 tablet (50 mg total) by mouth every 4 (four) hours as needed for  moderate pain.               Durable Medical Equipment  (From admission, onward)           Start     Ordered   04/07/22 1549  DME Walker rolling  Once       Question:  Patient needs a walker to treat with the following condition  Answer:  Total knee replacement status   04/07/22 1549   04/07/22 1549  DME Bedside commode  Once       Comments: Patient is not able to walk the distance required to go the bathroom, or he/she is unable to safely negotiate stairs required to access the bathroom.  A 3in1  BSC will alleviate this problem  Question:  Patient needs a bedside commode to treat with the following condition  Answer:  Total knee replacement status   04/07/22 1549            Disposition: Home with home health PT     Follow-up Information     Watt Climes, PA Follow up on 04/21/2022.   Specialty: Physician Assistant Why: at 9:45am Contact information: [place] [address] [phone]         Dereck Leep, MD Follow up on 05/20/2022.   Specialty: Orthopedic Surgery Why: at 2:45pm Contact information: [place] [address] [place], PA-C 04/08/2022, 10:27 AM

## 2022-04-07 NOTE — Progress Notes (Signed)
PT Cancellation Note  Patient Details Name: Kelly Nguyen MRN: [id number] DOB: 1955-11-26   Cancelled Treatment:    Reason Eval/Treat Not Completed: Medical issues which prohibited therapy: Pt presented with little to no return of sensation or strength to LE's at this time.  Will attempt to see pt at a future date/time as medically appropriate.     Linus Salmons PT, DPT 04/07/22, 5:21 PM

## 2022-04-08 ENCOUNTER — Encounter: Payer: Self-pay | Admitting: Orthopedic Surgery

## 2022-04-08 DIAGNOSIS — M1712 Unilateral primary osteoarthritis, left knee: Secondary | ICD-10-CM | POA: Diagnosis not present

## 2022-04-08 DIAGNOSIS — Z79899 Other long term (current) drug therapy: Secondary | ICD-10-CM | POA: Diagnosis not present

## 2022-04-08 DIAGNOSIS — Z96651 Presence of right artificial knee joint: Secondary | ICD-10-CM | POA: Diagnosis not present

## 2022-04-08 DIAGNOSIS — Z23 Encounter for immunization: Secondary | ICD-10-CM | POA: Diagnosis not present

## 2022-04-08 DIAGNOSIS — I10 Essential (primary) hypertension: Secondary | ICD-10-CM | POA: Diagnosis not present

## 2022-04-08 MED ORDER — OXYCODONE HCL 5 MG PO TABS: 5.0000 mg | ORAL_TABLET | ORAL | 0 refills | Status: DC | PRN

## 2022-04-08 MED ORDER — CELECOXIB 200 MG PO CAPS: 200.0000 mg | ORAL_CAPSULE | Freq: Two times a day (BID) | ORAL | 0 refills | Status: DC

## 2022-04-08 MED ORDER — ENOXAPARIN SODIUM 40 MG/0.4ML IJ SOSY: 40.0000 mg | PREFILLED_SYRINGE | INTRAMUSCULAR | 0 refills | Status: DC

## 2022-04-08 MED ORDER — TRAMADOL HCL 50 MG PO TABS: 50.0000 mg | ORAL_TABLET | ORAL | 0 refills | Status: DC | PRN

## 2022-04-08 NOTE — Progress Notes (Signed)
  Subjective: 1 Day Post-Op Procedure(s) (LRB): COMPUTER ASSISTED TOTAL KNEE ARTHROPLASTY (Left) Patient reports pain as well-controlled.   Patient is well, and has had no acute complaints or problems Plan is to go Home after hospital stay. Negative for chest pain and shortness of breath Fever: no Gastrointestinal: negative for nausea and vomiting.    Objective: Vital signs in last 24 hours: Temp:  [97.4 F (36.3 C)-98.6 F (37 C)] 97.9 F (36.6 C) (11/07 0814) Pulse Rate:  [54-68] 59 (11/07 0814) Resp:  [12-20] 20 (11/07 0005) BP: (89-161)/(53-82) 124/67 (11/07 0814) SpO2:  [93 %-100 %] 96 % (11/07 0814) Weight:  [79 kg] 79 kg (11/06 0936)  Intake/Output from previous day:  Intake/Output Summary (Last 24 hours) at 04/08/2022 0829 Last data filed at 04/08/2022 0700 Gross per 24 hour  Intake 1100 ml  Output 1195 ml  Net -95 ml    Intake/Output this shift: No intake/output data recorded.  Labs: No results for input(s): "HGB" in the last 72 hours. No results for input(s): "WBC", "RBC", "HCT", "PLT" in the last 72 hours. No results for input(s): "NA", "K", "CL", "CO2", "BUN", "CREATININE", "GLUCOSE", "CALCIUM" in the last 72 hours. No results for input(s): "LABPT", "INR" in the last 72 hours.   EXAM General - Patient is Alert, Appropriate, and Oriented Extremity - Neurovascular intact Dorsiflexion/Plantar flexion intact Compartment soft Dressing/Incision -Postoperative dressing remains in place., Polar Care in place and working. , Hemovac in place. , Following removal of post-op dressing, mild drainage noted  Motor Function - intact, moving foot and toes well on exam.  Cardiovascular- Regular rate and rhythm, no murmurs/rubs/gallops Respiratory- Lungs clear to auscultation bilaterally Gastrointestinal- soft, nontender, and active bowel sounds   Assessment/Plan: 1 Day Post-Op Procedure(s) (LRB): COMPUTER ASSISTED TOTAL KNEE ARTHROPLASTY (Left) Principal Problem:    Total knee replacement status, left  Estimated body mass index is 28.98 kg/m as calculated from the following:   Height as of this encounter: '5\' 5"'$  (1.651 m).   Weight as of this encounter: 79 kg. Advance diet Up with therapy   Post-op dressing removed. , Hemovac removed., and Mini compression dressing applied.   D/c likely this PM after completion of therapy goals  DVT Prophylaxis - Lovenox, Ted hose, and SCDs Weight-Bearing as tolerated to left leg  Cassell Smiles, PA-C [facility] 04/08/2022, 8:29 AM

## 2022-04-08 NOTE — Evaluation (Signed)
Occupational Therapy Evaluation Patient Details Name: Kelly Nguyen MRN: [id number] DOB: 06-12-1955 Today's Date: 04/08/2022   History of Present Illness KALEIYAH Nguyen is a 66 y.o. female that is scheduled to undergo a left total knee arthroplasty on 04/07/22 by Dr. Marry Guan.   Clinical Impression   Patient presenting with decreased independence in self-care, functional mobility, safety, strength, and endurance. Patient reports she lives at home with her husband who is able to provide 24/7 assistance at discharge. Patient reports she has a RW from previous TKR. At baseline patient report she is independent with all ADL and IADL tasks. Patient currently functioning at min guard for transfer sit<>stand using RW. Patient was able to ambulate ~30 ft in room to bathroom, transferred to toilet, and performed peri-care with supervision/ min guard. Patient was educated on polar care system, LB dressing techniques, and other equipment that would be of assistance to patient. Patient was able to verbalize and demonstrated understanding. Handout given. Patient left in recliner with call bell in reach, family present in room, chair alarm set, and all needs met. No OT treatment recommended at this time. OT to complete order.    Recommendations for follow up therapy are one component of a multi-disciplinary discharge planning process, led by the attending physician.  Recommendations may be updated based on patient status, additional functional criteria and insurance authorization.   Follow Up Recommendations  No OT follow up    Assistance Recommended at Discharge Intermittent Supervision/Assistance  Patient can return home with the following A little help with walking and/or transfers;A little help with bathing/dressing/bathroom;Help with stairs or ramp for entrance;Assist for transportation    Functional Status Assessment  Patient has had a recent decline in their functional status and demonstrates the ability to  make significant improvements in function in a reasonable and predictable amount of time.  Equipment Recommendations  BSC/3in1    Recommendations for Other Services       Precautions / Restrictions Precautions Precautions: Fall Restrictions Weight Bearing Restrictions: Yes LLE Weight Bearing: Weight bearing as tolerated      Mobility Bed Mobility               General bed mobility comments: Patient in recliner upon arrrival.    Transfers Overall transfer level: Needs assistance Equipment used: Rolling walker (2 wheels) Transfers: Sit to/from Stand Sit to Stand: Min guard                  Balance Overall balance assessment: Needs assistance Sitting-balance support: Feet supported, No upper extremity supported Sitting balance-Leahy Scale: Good     Standing balance support: Bilateral upper extremity supported Standing balance-Leahy Scale: Fair                             ADL either performed or assessed with clinical judgement   ADL Overall ADL's : Needs assistance/impaired     Grooming: Set up;Wash/dry face;Oral care           Upper Body Dressing : Supervision/safety   Lower Body Dressing: Supervision/safety;Sit to/from stand   Toilet Transfer: Supervision/safety   Toileting- Water quality scientist and Hygiene: Supervision/safety;Min guard       Functional mobility during ADLs: Supervision/safety;Rolling walker (2 wheels)       Vision Patient Visual Report: No change from baseline              Pertinent Vitals/Pain Pain Assessment Pain Assessment: No/denies pain     Hand  Dominance Right   Extremity/Trunk Assessment Upper Extremity Assessment Upper Extremity Assessment: Overall WFL for tasks assessed   Lower Extremity Assessment Lower Extremity Assessment: LLE deficits/detail;Generalized weakness       Communication Communication Communication: No difficulties   Cognition Arousal/Alertness:  Awake/alert Behavior During Therapy: WFL for tasks assessed/performed Overall Cognitive Status: Within Functional Limits for tasks assessed                                                  Home Living Family/patient expects to be discharged to:: Private residence Living Arrangements: Spouse/significant other Available Help at Discharge: Family;Available 24 hours/day Type of Home: House Home Access: Stairs to enter [employer] of Steps: 5 Entrance Stairs-Rails: Left;Right       Bathroom Shower/Tub: Teacher, early years/pre: Handicapped height     Home Equipment: Conservation officer, nature (2 wheels)          Prior Functioning/Environment Prior Level of Function : Independent/Modified Independent;Driving             Mobility Comments: ambulates without AD at baseline ADLs Comments: Independent                 OT Goals(Current goals can be found in the care plan section) Acute Rehab OT Goals Patient Stated Goal: return to home OT Goal Formulation: With patient/family Time For Goal Achievement: 04/08/22 Potential to Achieve Goals: Good   AM-PAC OT "6 Clicks" Daily Activity     Outcome Measure Help from another person eating meals?: None Help from another person taking care of personal grooming?: A Little Help from another person toileting, which includes using toliet, bedpan, or urinal?: A Little Help from another person bathing (including washing, rinsing, drying)?: A Little Help from another person to put on and taking off regular upper body clothing?: None Help from another person to put on and taking off regular lower body clothing?: A Little 6 Click Score: 20   End of Session Equipment Utilized During Treatment: Rolling walker (2 wheels) Nurse Communication: Mobility status;Patient requests pain meds  Activity Tolerance: Patient tolerated treatment well Patient left: in chair;with call bell/phone within reach;with chair alarm  set                   Time: 6606-3016 OT Time Calculation (min): 29 min Charges:       Tomasa Blase, OTS 04/08/2022, 11:20 AM

## 2022-04-08 NOTE — Evaluation (Signed)
Physical Therapy Evaluation Patient Details Name: Kelly Nguyen MRN: [id number] DOB: 1955/11/24 Today's Date: 04/08/2022  History of Present Illness  Pt is a 66 yo F diagnosed with degenerative arthrosis of the left knee and is s/p elective L TKA.  PMH includes HTN and enlarged heart.   Clinical Impression  Pt was pleasant and motivated to participate during the session and put forth good effort throughout. Pt required no physical assistance during the session and demonstrated good carryover of proper sequencing with functional tasks. Pt was steady with transfers, gait, and stair training with no adverse symptoms other than L knee pain.  Pt's SpO2 and HR were both WNL on room air.  Pt will benefit from HHPT upon discharge to safely address deficits listed in patient problem list for decreased caregiver assistance and eventual return to PLOF.         Recommendations for follow up therapy are one component of a multi-disciplinary discharge planning process, led by the attending physician.  Recommendations may be updated based on patient status, additional functional criteria and insurance authorization.  Follow Up Recommendations No PT follow up      Assistance Recommended at Discharge Intermittent Supervision/Assistance  Patient can return home with the following  A little help with walking and/or transfers;A little help with bathing/dressing/bathroom;Assist for transportation;Help with stairs or ramp for entrance;Assistance with cooking/housework    Equipment Recommendations Other (comment) (Pt to purchase own [facility])  Recommendations for Other Services       Functional Status Assessment Patient has had a recent decline in their functional status and demonstrates the ability to make significant improvements in function in a reasonable and predictable amount of time.     Precautions / Restrictions Precautions Precautions: Fall Restrictions Weight Bearing Restrictions: Yes LLE Weight  Bearing: Weight bearing as tolerated Other Position/Activity Restrictions: No KI required, pt able to perform Ind LLE SLR      Mobility  Bed Mobility               General bed mobility comments: NT, pt found and left in recliner    Transfers Overall transfer level: Needs assistance Equipment used: Rolling walker (2 wheels) Transfers: Sit to/from Stand Sit to Stand: Supervision           General transfer comment: Min verbal cues for sequencing    Ambulation/Gait Ambulation/Gait assistance: Supervision Gait Distance (Feet): 250 Feet Assistive device: Rolling walker (2 wheels) Gait Pattern/deviations: Step-through pattern, Decreased step length - right, Decreased step length - left, Decreased stance time - left, Antalgic Gait velocity: minimally decreased     General Gait Details: Good cadence and step-through pattern and only minimally antalgic on the LLE; pt steady during ambulation without LOB or buckling  Stairs Stairs: Yes Stairs assistance: Min guard Stair Management: One rail Left, Forwards, Step to pattern Number of Stairs: 4 General stair comments: Min verbal and visual cues for sequencing with pt demonstrating good eccentric and concentric control and stability  Wheelchair Mobility    Modified Rankin (Stroke Patients Only)       Balance Overall balance assessment: Needs assistance Sitting-balance support: Feet supported, No upper extremity supported Sitting balance-Leahy Scale: Good     Standing balance support: Bilateral upper extremity supported, During functional activity Standing balance-Leahy Scale: Good                               Pertinent Vitals/Pain Pain Assessment Pain Assessment: 0-10 Pain Score:  4  Pain Location: L knee Pain Descriptors / Indicators: Sore Pain Intervention(s): Premedicated before session, Monitored during session, Repositioned, Ice applied    Home Living Family/patient expects to be discharged  to:: Private residence Living Arrangements: Spouse/significant other Available Help at Discharge: Family;Available 24 hours/day Type of Home: House Home Access: Stairs to enter Entrance Stairs-Rails: Right;Left (Too wide for both) Entrance Stairs-Number of Steps: 5   Home Layout: Two level;Able to live on main level with bedroom/bathroom Home Equipment: Rolling Walker (2 wheels);Shower seat - built in      Prior Function Prior Level of Function : Independent/Modified Independent;Driving             Mobility Comments: Ind amb community distances without an AD, no fall history ADLs Comments: Ind with ADLs     Hand Dominance   Dominant Hand: Right    Extremity/Trunk Assessment   Upper Extremity Assessment Upper Extremity Assessment: Overall WFL for tasks assessed    Lower Extremity Assessment Lower Extremity Assessment: Generalized weakness;LLE deficits/detail LLE: Unable to fully assess due to pain LLE Sensation: WNL LLE Coordination: WNL       Communication   Communication: No difficulties  Cognition Arousal/Alertness: Awake/alert Behavior During Therapy: WFL for tasks assessed/performed Overall Cognitive Status: Within Functional Limits for tasks assessed                                          General Comments      Exercises Total Joint Exercises Quad Sets: AROM, Strengthening, Left, 5 reps, 10 reps Long Arc Quad: AROM, Strengthening, Left, 5 reps, 10 reps Knee Flexion: AROM, Strengthening, Left, 5 reps, 10 reps Goniometric ROM: L knee AROM: 3-94 deg Other Exercises Other Exercises: Positioning education to promote L knee ext PROM and offload heel Other Exercises: Car transfer sequencing education provided Other Exercises: HEP education and review per handout Other Exercises: Transfer training from multiple surfaces with and without arm rests   Assessment/Plan    PT Assessment Patient needs continued PT services  PT Problem List  Decreased strength;Decreased range of motion;Decreased activity tolerance;Decreased balance;Decreased mobility;Decreased knowledge of use of DME;Pain       PT Treatment Interventions DME instruction;Gait training;Stair training;Functional mobility training;Therapeutic activities;Therapeutic exercise;Balance training;Patient/family education    PT Goals (Current goals can be found in the Care Plan section)  Acute Rehab PT Goals Patient Stated Goal: To walk better and be able to hike PT Goal Formulation: With patient Time For Goal Achievement: 04/21/22 Potential to Achieve Goals: Good    Frequency BID     Co-evaluation               AM-PAC PT "6 Clicks" Mobility  Outcome Measure Help needed turning from your back to your side while in a flat bed without using bedrails?: A Little Help needed moving from lying on your back to sitting on the side of a flat bed without using bedrails?: A Little Help needed moving to and from a bed to a chair (including a wheelchair)?: A Little Help needed standing up from a chair using your arms (e.g., wheelchair or bedside chair)?: A Little Help needed to walk in hospital room?: A Little Help needed climbing 3-5 steps with a railing? : A Little 6 Click Score: 18    End of Session Equipment Utilized During Treatment: Gait belt Activity Tolerance: Patient tolerated treatment well Patient left: in chair;with call  bell/phone within reach;with chair alarm set;with SCD's reapplied;Other (comment) (polar care donned to L knee, SCD to RLE) Nurse Communication: Mobility status;Weight bearing status PT Visit Diagnosis: Muscle weakness (generalized) (M62.81);Other abnormalities of gait and mobility (R26.89);Pain Pain - Right/Left: Left Pain - part of body: Knee    Time: 8590-9311 PT Time Calculation (min) (ACUTE ONLY): 43 min   Charges:   PT Evaluation $PT Eval Moderate Complexity: 1 Mod PT Treatments $Gait Training: 8-22 mins $Therapeutic  Exercise: 8-22 mins       D. Scott Dael Howland PT, DPT 04/08/22, 11:30 AM

## 2022-04-08 NOTE — Progress Notes (Signed)
The patient is receiving Protonix by the intravenous route.  Based on criteria approved by the Pharmacy and [facility], the medication is being converted to the equivalent oral dose form.  These criteria include: -No active GI bleeding -Able to tolerate diet of full liquids (or better) or tube feeding -Able to tolerate other medications by the oral or enteral route  If you have any questions about this conversion, please contact the Pharmacy Department (phone [phone]).  Thank you.   Glean Salvo, PharmD Clinical Pharmacist  04/08/2022 10:28 AM

## 2022-04-08 NOTE — Progress Notes (Signed)
1059 Pt has passed PT, hemovac was removed.  D/C AVS completed and reviewed with pt. All opportunities for questions answered and clarified. IV removed. Pt will be wheeled down to car at medical mall entrance via wheelchair.

## 2022-04-08 NOTE — Progress Notes (Signed)
Met with he patient in the room She lives at home with her husband He will provide transportation She is set up with [facility] for [facility] services prior to Surgery with surgeons office She has a rolling walker and had gotten a 3 in 1 previously and will not need additional DME she stated that she is not sure where the 3 in 1 is but will purchase one outside of the hospital as her insurance will not cover a new one yet Her husband to provide transportation

## 2022-04-08 NOTE — Plan of Care (Signed)
  Problem: Activity: Goal: Ability to avoid complications of mobility impairment will improve Outcome: Progressing   Problem: Pain Management: Goal: Pain level will decrease with appropriate interventions Outcome: Progressing   Problem: Education: Goal: Knowledge of General Education information will improve Description: Including pain rating scale, medication(s)/side effects and non-pharmacologic comfort measures Outcome: Progressing

## 2022-04-09 DIAGNOSIS — Z471 Aftercare following joint replacement surgery: Secondary | ICD-10-CM | POA: Diagnosis not present

## 2022-04-09 DIAGNOSIS — Z791 Long term (current) use of non-steroidal anti-inflammatories (NSAID): Secondary | ICD-10-CM | POA: Diagnosis not present

## 2022-04-09 DIAGNOSIS — K219 Gastro-esophageal reflux disease without esophagitis: Secondary | ICD-10-CM | POA: Diagnosis not present

## 2022-04-09 DIAGNOSIS — Z96652 Presence of left artificial knee joint: Secondary | ICD-10-CM | POA: Diagnosis not present

## 2022-04-09 DIAGNOSIS — Z9181 History of falling: Secondary | ICD-10-CM | POA: Diagnosis not present

## 2022-04-09 DIAGNOSIS — Z8781 Personal history of (healed) traumatic fracture: Secondary | ICD-10-CM | POA: Diagnosis not present

## 2022-04-09 DIAGNOSIS — Z7901 Long term (current) use of anticoagulants: Secondary | ICD-10-CM | POA: Diagnosis not present

## 2022-04-09 DIAGNOSIS — E782 Mixed hyperlipidemia: Secondary | ICD-10-CM | POA: Diagnosis not present

## 2022-04-09 DIAGNOSIS — Z96651 Presence of right artificial knee joint: Secondary | ICD-10-CM | POA: Diagnosis not present

## 2022-04-09 DIAGNOSIS — K449 Diaphragmatic hernia without obstruction or gangrene: Secondary | ICD-10-CM | POA: Diagnosis not present

## 2022-04-09 DIAGNOSIS — K5730 Diverticulosis of large intestine without perforation or abscess without bleeding: Secondary | ICD-10-CM | POA: Diagnosis not present

## 2022-04-09 DIAGNOSIS — I10 Essential (primary) hypertension: Secondary | ICD-10-CM | POA: Diagnosis not present

## 2022-04-09 DIAGNOSIS — M5410 Radiculopathy, site unspecified: Secondary | ICD-10-CM | POA: Diagnosis not present

## 2022-04-09 NOTE — Anesthesia Postprocedure Evaluation (Signed)
Anesthesia Post Note  Patient: Kelly Nguyen  Procedure(s) Performed: COMPUTER ASSISTED TOTAL KNEE ARTHROPLASTY (Left: Knee)  Anesthesia Type: Spinal Anesthetic complications: no Comments: Patient discharged prior to patient evaluation  No notable events documented.   Last Vitals:  Vitals:   04/08/22 0005 04/08/22 0814  BP: (!) 89/57 124/67  Pulse: (!) 55 (!) 59  Resp: 20   Temp: 36.7 C 36.6 C  SpO2: 99% 96%    Last Pain:  Vitals:   04/08/22 0936  TempSrc:   PainSc: 2                  Alison Stalling

## 2022-04-11 DIAGNOSIS — M5410 Radiculopathy, site unspecified: Secondary | ICD-10-CM | POA: Diagnosis not present

## 2022-04-11 DIAGNOSIS — E782 Mixed hyperlipidemia: Secondary | ICD-10-CM | POA: Diagnosis not present

## 2022-04-11 DIAGNOSIS — Z9181 History of falling: Secondary | ICD-10-CM | POA: Diagnosis not present

## 2022-04-11 DIAGNOSIS — Z7901 Long term (current) use of anticoagulants: Secondary | ICD-10-CM | POA: Diagnosis not present

## 2022-04-11 DIAGNOSIS — Z8781 Personal history of (healed) traumatic fracture: Secondary | ICD-10-CM | POA: Diagnosis not present

## 2022-04-11 DIAGNOSIS — K219 Gastro-esophageal reflux disease without esophagitis: Secondary | ICD-10-CM | POA: Diagnosis not present

## 2022-04-11 DIAGNOSIS — Z471 Aftercare following joint replacement surgery: Secondary | ICD-10-CM | POA: Diagnosis not present

## 2022-04-11 DIAGNOSIS — Z96651 Presence of right artificial knee joint: Secondary | ICD-10-CM | POA: Diagnosis not present

## 2022-04-11 DIAGNOSIS — I10 Essential (primary) hypertension: Secondary | ICD-10-CM | POA: Diagnosis not present

## 2022-04-11 DIAGNOSIS — Z791 Long term (current) use of non-steroidal anti-inflammatories (NSAID): Secondary | ICD-10-CM | POA: Diagnosis not present

## 2022-04-11 DIAGNOSIS — Z96652 Presence of left artificial knee joint: Secondary | ICD-10-CM | POA: Diagnosis not present

## 2022-04-11 DIAGNOSIS — K449 Diaphragmatic hernia without obstruction or gangrene: Secondary | ICD-10-CM | POA: Diagnosis not present

## 2022-04-11 DIAGNOSIS — K5730 Diverticulosis of large intestine without perforation or abscess without bleeding: Secondary | ICD-10-CM | POA: Diagnosis not present

## 2022-04-13 DIAGNOSIS — K219 Gastro-esophageal reflux disease without esophagitis: Secondary | ICD-10-CM | POA: Diagnosis not present

## 2022-04-13 DIAGNOSIS — Z9181 History of falling: Secondary | ICD-10-CM | POA: Diagnosis not present

## 2022-04-13 DIAGNOSIS — Z96651 Presence of right artificial knee joint: Secondary | ICD-10-CM | POA: Diagnosis not present

## 2022-04-13 DIAGNOSIS — Z7901 Long term (current) use of anticoagulants: Secondary | ICD-10-CM | POA: Diagnosis not present

## 2022-04-13 DIAGNOSIS — Z471 Aftercare following joint replacement surgery: Secondary | ICD-10-CM | POA: Diagnosis not present

## 2022-04-13 DIAGNOSIS — Z791 Long term (current) use of non-steroidal anti-inflammatories (NSAID): Secondary | ICD-10-CM | POA: Diagnosis not present

## 2022-04-13 DIAGNOSIS — E782 Mixed hyperlipidemia: Secondary | ICD-10-CM | POA: Diagnosis not present

## 2022-04-13 DIAGNOSIS — K449 Diaphragmatic hernia without obstruction or gangrene: Secondary | ICD-10-CM | POA: Diagnosis not present

## 2022-04-13 DIAGNOSIS — M5410 Radiculopathy, site unspecified: Secondary | ICD-10-CM | POA: Diagnosis not present

## 2022-04-13 DIAGNOSIS — K5730 Diverticulosis of large intestine without perforation or abscess without bleeding: Secondary | ICD-10-CM | POA: Diagnosis not present

## 2022-04-13 DIAGNOSIS — Z96652 Presence of left artificial knee joint: Secondary | ICD-10-CM | POA: Diagnosis not present

## 2022-04-13 DIAGNOSIS — Z8781 Personal history of (healed) traumatic fracture: Secondary | ICD-10-CM | POA: Diagnosis not present

## 2022-04-13 DIAGNOSIS — I10 Essential (primary) hypertension: Secondary | ICD-10-CM | POA: Diagnosis not present

## 2022-04-15 DIAGNOSIS — I10 Essential (primary) hypertension: Secondary | ICD-10-CM | POA: Diagnosis not present

## 2022-04-15 DIAGNOSIS — Z7901 Long term (current) use of anticoagulants: Secondary | ICD-10-CM | POA: Diagnosis not present

## 2022-04-15 DIAGNOSIS — Z96652 Presence of left artificial knee joint: Secondary | ICD-10-CM | POA: Diagnosis not present

## 2022-04-15 DIAGNOSIS — E782 Mixed hyperlipidemia: Secondary | ICD-10-CM | POA: Diagnosis not present

## 2022-04-15 DIAGNOSIS — Z96651 Presence of right artificial knee joint: Secondary | ICD-10-CM | POA: Diagnosis not present

## 2022-04-15 DIAGNOSIS — K449 Diaphragmatic hernia without obstruction or gangrene: Secondary | ICD-10-CM | POA: Diagnosis not present

## 2022-04-15 DIAGNOSIS — K219 Gastro-esophageal reflux disease without esophagitis: Secondary | ICD-10-CM | POA: Diagnosis not present

## 2022-04-15 DIAGNOSIS — K5730 Diverticulosis of large intestine without perforation or abscess without bleeding: Secondary | ICD-10-CM | POA: Diagnosis not present

## 2022-04-15 DIAGNOSIS — Z9181 History of falling: Secondary | ICD-10-CM | POA: Diagnosis not present

## 2022-04-15 DIAGNOSIS — M5410 Radiculopathy, site unspecified: Secondary | ICD-10-CM | POA: Diagnosis not present

## 2022-04-15 DIAGNOSIS — Z791 Long term (current) use of non-steroidal anti-inflammatories (NSAID): Secondary | ICD-10-CM | POA: Diagnosis not present

## 2022-04-15 DIAGNOSIS — Z471 Aftercare following joint replacement surgery: Secondary | ICD-10-CM | POA: Diagnosis not present

## 2022-04-15 DIAGNOSIS — Z8781 Personal history of (healed) traumatic fracture: Secondary | ICD-10-CM | POA: Diagnosis not present

## 2022-04-17 DIAGNOSIS — Z96651 Presence of right artificial knee joint: Secondary | ICD-10-CM | POA: Diagnosis not present

## 2022-04-17 DIAGNOSIS — K449 Diaphragmatic hernia without obstruction or gangrene: Secondary | ICD-10-CM | POA: Diagnosis not present

## 2022-04-17 DIAGNOSIS — Z96652 Presence of left artificial knee joint: Secondary | ICD-10-CM | POA: Diagnosis not present

## 2022-04-17 DIAGNOSIS — Z471 Aftercare following joint replacement surgery: Secondary | ICD-10-CM | POA: Diagnosis not present

## 2022-04-17 DIAGNOSIS — E782 Mixed hyperlipidemia: Secondary | ICD-10-CM | POA: Diagnosis not present

## 2022-04-17 DIAGNOSIS — K5730 Diverticulosis of large intestine without perforation or abscess without bleeding: Secondary | ICD-10-CM | POA: Diagnosis not present

## 2022-04-17 DIAGNOSIS — I10 Essential (primary) hypertension: Secondary | ICD-10-CM | POA: Diagnosis not present

## 2022-04-17 DIAGNOSIS — K219 Gastro-esophageal reflux disease without esophagitis: Secondary | ICD-10-CM | POA: Diagnosis not present

## 2022-04-17 DIAGNOSIS — Z8781 Personal history of (healed) traumatic fracture: Secondary | ICD-10-CM | POA: Diagnosis not present

## 2022-04-17 DIAGNOSIS — M5410 Radiculopathy, site unspecified: Secondary | ICD-10-CM | POA: Diagnosis not present

## 2022-04-17 DIAGNOSIS — Z7901 Long term (current) use of anticoagulants: Secondary | ICD-10-CM | POA: Diagnosis not present

## 2022-04-17 DIAGNOSIS — Z9181 History of falling: Secondary | ICD-10-CM | POA: Diagnosis not present

## 2022-04-17 DIAGNOSIS — Z791 Long term (current) use of non-steroidal anti-inflammatories (NSAID): Secondary | ICD-10-CM | POA: Diagnosis not present

## 2022-04-18 DIAGNOSIS — Z8781 Personal history of (healed) traumatic fracture: Secondary | ICD-10-CM | POA: Diagnosis not present

## 2022-04-18 DIAGNOSIS — K5730 Diverticulosis of large intestine without perforation or abscess without bleeding: Secondary | ICD-10-CM | POA: Diagnosis not present

## 2022-04-18 DIAGNOSIS — Z96652 Presence of left artificial knee joint: Secondary | ICD-10-CM | POA: Diagnosis not present

## 2022-04-18 DIAGNOSIS — Z791 Long term (current) use of non-steroidal anti-inflammatories (NSAID): Secondary | ICD-10-CM | POA: Diagnosis not present

## 2022-04-18 DIAGNOSIS — M5410 Radiculopathy, site unspecified: Secondary | ICD-10-CM | POA: Diagnosis not present

## 2022-04-18 DIAGNOSIS — Z471 Aftercare following joint replacement surgery: Secondary | ICD-10-CM | POA: Diagnosis not present

## 2022-04-18 DIAGNOSIS — E782 Mixed hyperlipidemia: Secondary | ICD-10-CM | POA: Diagnosis not present

## 2022-04-18 DIAGNOSIS — Z7901 Long term (current) use of anticoagulants: Secondary | ICD-10-CM | POA: Diagnosis not present

## 2022-04-18 DIAGNOSIS — Z96651 Presence of right artificial knee joint: Secondary | ICD-10-CM | POA: Diagnosis not present

## 2022-04-18 DIAGNOSIS — K449 Diaphragmatic hernia without obstruction or gangrene: Secondary | ICD-10-CM | POA: Diagnosis not present

## 2022-04-18 DIAGNOSIS — Z9181 History of falling: Secondary | ICD-10-CM | POA: Diagnosis not present

## 2022-04-18 DIAGNOSIS — K219 Gastro-esophageal reflux disease without esophagitis: Secondary | ICD-10-CM | POA: Diagnosis not present

## 2022-04-18 DIAGNOSIS — I10 Essential (primary) hypertension: Secondary | ICD-10-CM | POA: Diagnosis not present

## 2022-04-21 DIAGNOSIS — M25562 Pain in left knee: Secondary | ICD-10-CM | POA: Diagnosis not present

## 2022-04-21 DIAGNOSIS — Z96652 Presence of left artificial knee joint: Secondary | ICD-10-CM | POA: Diagnosis not present

## 2022-04-23 DIAGNOSIS — Z96652 Presence of left artificial knee joint: Secondary | ICD-10-CM | POA: Diagnosis not present

## 2022-04-28 DIAGNOSIS — M25562 Pain in left knee: Secondary | ICD-10-CM | POA: Diagnosis not present

## 2022-04-28 DIAGNOSIS — Z96652 Presence of left artificial knee joint: Secondary | ICD-10-CM | POA: Diagnosis not present

## 2022-04-29 ENCOUNTER — Encounter: Payer: Self-pay | Admitting: Physician Assistant

## 2022-04-29 ENCOUNTER — Ambulatory Visit (INDEPENDENT_AMBULATORY_CARE_PROVIDER_SITE_OTHER): Payer: Medicare HMO | Admitting: Physician Assistant

## 2022-04-29 VITALS — BP 123/78 | HR 78 | Temp 98.6°F | Wt 172.1 lb

## 2022-04-29 DIAGNOSIS — R350 Frequency of micturition: Secondary | ICD-10-CM | POA: Diagnosis not present

## 2022-04-29 DIAGNOSIS — N3001 Acute cystitis with hematuria: Secondary | ICD-10-CM

## 2022-04-29 LAB — POCT URINALYSIS DIPSTICK
Glucose, UA: NEGATIVE
Protein, UA: POSITIVE — AB
Spec Grav, UA: <=1.005 — AB (ref 1.010–1.025)
Urobilinogen, UA: 2 E.U./dL — AB
pH, UA: 8 (ref 5.0–8.0)

## 2022-04-29 MED ORDER — SULFAMETHOXAZOLE-TRIMETHOPRIM 800-160 MG PO TABS: 1.0000 | ORAL_TABLET | Freq: Two times a day (BID) | ORAL | 0 refills | Status: DC

## 2022-04-29 NOTE — Progress Notes (Signed)
Established patient visit   Patient: Kelly Nguyen   DOB: 1955/07/07   66 y.o. Female  MRN: [id number] Visit Date: 04/29/2022  Today's healthcare provider: Mikey Kirschner, PA-C  I,Kelsi Benham,acting as a scribe for Mikey Kirschner, PA-C.,have documented all relevant documentation on the behalf of Mikey Kirschner, PA-C,as directed by  Mikey Kirschner, PA-C while in the presence of Mikey Kirschner, PA-C.  Cc. Urinary frequency x 1 week  Subjective      Pt reports dysuria, frequent urination x 1 week. Denies visible hematuria. Unsure if really hurting as she is on pain medication, s/p knee replacement 3 weeks ago. Medications: Outpatient Medications Prior to Visit  Medication Sig   celecoxib (CELEBREX) 200 MG capsule Take 1 capsule (200 mg total) by mouth daily.   celecoxib (CELEBREX) 200 MG capsule Take 1 capsule (200 mg total) by mouth 2 (two) times daily.   lisinopril-hydrochlorothiazide (ZESTORETIC) 10-12.5 MG tablet Take 1 tablet by mouth daily.   Multiple Vitamins-Minerals (MULTIVITAMIN WITH MINERALS) tablet Take 1 tablet by mouth daily.   oxyCODONE (OXY IR/ROXICODONE) 5 MG immediate release tablet Take 1 tablet (5 mg total) by mouth every 4 (four) hours as needed for severe pain.   traMADol (ULTRAM) 50 MG tablet Take 1 tablet (50 mg total) by mouth every 4 (four) hours as needed for moderate pain.   enoxaparin (LOVENOX) 40 MG/0.4ML injection Inject 0.4 mLs (40 mg total) into the skin daily for 14 days.   No facility-administered medications prior to visit.    Review of Systems  Genitourinary:  Positive for difficulty urinating, dysuria and frequency.     Objective    Blood pressure 123/78, pulse 78, temperature 98.6 F (37 C), temperature source Oral, weight 172 lb 1.6 oz (78.1 kg), SpO2 98 %.   Physical Exam Vitals reviewed.  Constitutional:      Appearance: She is not ill-appearing.  HENT:     Head: Normocephalic.  Eyes:     Conjunctiva/sclera: Conjunctivae normal.   Cardiovascular:     Rate and Rhythm: Normal rate.  Pulmonary:     Effort: Pulmonary effort is normal. No respiratory distress.  Abdominal:     General: Abdomen is flat. There is no distension.     Tenderness: There is no abdominal tenderness. There is no guarding.  Neurological:     General: No focal deficit present.     Mental Status: She is alert and oriented to person, place, and time.  Psychiatric:        Mood and Affect: Mood normal.        Behavior: Behavior normal.     Results for orders placed or performed in visit on 04/29/22  POCT Urinalysis Dipstick  Result Value Ref Range   Color, UA dark yellow    Clarity, UA cloudy    Glucose, UA Negative Negative   Bilirubin, UA moderate    Ketones, UA trace    Spec Grav, UA <=1.005 (A) 1.010 - 1.025   Blood, UA moderate    pH, UA 8.0 5.0 - 8.0   Protein, UA Positive (A) Negative   Urobilinogen, UA 2.0 (A) 0.2 or 1.0 E.U./dL   Nitrite, UA postive    Leukocytes, UA Moderate (2+) (A) Negative   Appearance     Odor      Assessment & Plan     Acute cystitis UA + leuk, mod blood, + nitrites Ordered urine culture/urine micro  Increase fluids Rx bactrim pobid x 5 days  Return if  symptoms worsen or fail to improve.     I, Mikey Kirschner, PA-C have reviewed all documentation for this visit. The documentation on  04/29/2022  for the exam, diagnosis, procedures, and orders are all accurate and complete.  Mikey Kirschner, PA-C [facility] [address] [place], [place], [address] Office: [phone] Fax: [place]

## 2022-04-30 LAB — URINALYSIS, MICROSCOPIC ONLY
Bacteria, UA: NONE SEEN
Casts: NONE SEEN /lpf
RBC, Urine: NONE SEEN /hpf (ref 0–2)
WBC, UA: >30 /hpf — AB (ref 0–5)

## 2022-05-01 LAB — URINE CULTURE: Organism ID, Bacteria: NO GROWTH

## 2022-05-06 DIAGNOSIS — M25562 Pain in left knee: Secondary | ICD-10-CM | POA: Diagnosis not present

## 2022-05-06 DIAGNOSIS — Z96652 Presence of left artificial knee joint: Secondary | ICD-10-CM | POA: Diagnosis not present

## 2022-05-08 DIAGNOSIS — H5213 Myopia, bilateral: Secondary | ICD-10-CM | POA: Diagnosis not present

## 2022-05-08 DIAGNOSIS — Z96652 Presence of left artificial knee joint: Secondary | ICD-10-CM | POA: Diagnosis not present

## 2022-05-08 DIAGNOSIS — M25562 Pain in left knee: Secondary | ICD-10-CM | POA: Diagnosis not present

## 2022-05-09 DIAGNOSIS — Z471 Aftercare following joint replacement surgery: Secondary | ICD-10-CM | POA: Diagnosis not present

## 2022-05-19 DIAGNOSIS — Z0100 Encounter for examination of eyes and vision without abnormal findings: Secondary | ICD-10-CM | POA: Diagnosis not present

## 2022-05-20 DIAGNOSIS — Z96652 Presence of left artificial knee joint: Secondary | ICD-10-CM | POA: Diagnosis not present

## 2022-05-28 DIAGNOSIS — R197 Diarrhea, unspecified: Secondary | ICD-10-CM | POA: Diagnosis not present

## 2022-05-28 DIAGNOSIS — Z86010 Personal history of colonic polyps: Secondary | ICD-10-CM | POA: Diagnosis not present

## 2022-06-16 DIAGNOSIS — R69 Illness, unspecified: Secondary | ICD-10-CM | POA: Diagnosis not present

## 2022-06-17 DIAGNOSIS — R69 Illness, unspecified: Secondary | ICD-10-CM | POA: Diagnosis not present

## 2022-06-18 ENCOUNTER — Telehealth: Payer: Self-pay | Admitting: Physician Assistant

## 2022-06-18 NOTE — Telephone Encounter (Signed)
[facility] faxed refill request for the following medications:  celecoxib (CELEBREX) 200 MG capsule    Please advise.

## 2022-06-18 NOTE — Telephone Encounter (Signed)
Please advise 

## 2022-06-19 ENCOUNTER — Other Ambulatory Visit: Payer: Self-pay | Admitting: Physician Assistant

## 2022-06-19 DIAGNOSIS — M1990 Unspecified osteoarthritis, unspecified site: Secondary | ICD-10-CM

## 2022-06-19 MED ORDER — CELECOXIB 200 MG PO CAPS: 200.0000 mg | ORAL_CAPSULE | Freq: Every day | ORAL | 0 refills | Status: DC

## 2022-06-19 NOTE — Telephone Encounter (Signed)
sent 

## 2022-07-30 NOTE — Progress Notes (Unsigned)
I,Coumba Kellison R Algie Westry,acting as a Education administrator for [employer], PA-C.,have documented all relevant documentation on the behalf of Mikey Kirschner, PA-C,as directed by  Mikey Kirschner, PA-C while in the presence of Mikey Kirschner, PA-C.   Annual Wellness Visit     Patient: Kelly Nguyen, Female    DOB: [birth year], 67 y.o.   MRN: [id number] Visit Date: 07/31/2022  Today's Provider: Mikey Kirschner, PA-C   Cc. Awv/cpe  Subjective    Kelly Nguyen is a 67 y.o. female who presents today for her Annual Wellness Visit. She reports consuming a general diet. Gym/ health club routine includes cardio and swimming. She generally feels well. She reports sleeping well. She does not have additional problems to discuss today.   HPI  Medications: Outpatient Medications Prior to Visit  Medication Sig   celecoxib (CELEBREX) 200 MG capsule Take 1 capsule (200 mg total) by mouth daily.   colestipol (COLESTID) 1 g tablet Take by mouth.   lisinopril-hydrochlorothiazide (ZESTORETIC) 10-12.5 MG tablet Take 1 tablet by mouth daily.   Multiple Vitamins-Minerals (MULTIVITAMIN WITH MINERALS) tablet Take 1 tablet by mouth daily.   Na Sulfate-K Sulfate-Mg Sulf 17.5-3.13-1.6 GM/177ML SOLN Take by mouth.   [DISCONTINUED] enoxaparin (LOVENOX) 40 MG/0.4ML injection Inject 0.4 mLs (40 mg total) into the skin daily for 14 days.   [DISCONTINUED] oxyCODONE (OXY IR/ROXICODONE) 5 MG immediate release tablet Take 1 tablet (5 mg total) by mouth every 4 (four) hours as needed for severe pain.   [DISCONTINUED] sulfamethoxazole-trimethoprim (BACTRIM DS) 800-160 MG tablet Take 1 tablet by mouth 2 (two) times daily.   [DISCONTINUED] traMADol (ULTRAM) 50 MG tablet Take 1 tablet (50 mg total) by mouth every 4 (four) hours as needed for moderate pain.   No facility-administered medications prior to visit.    Allergies  Allergen Reactions   Acyclovir And Related    Meloxicam Rash    Patient Care Team: Mikey Kirschner, PA-C as PCP -  General (Physician Assistant) Marry Guan Laurice Record, MD (Orthopedic Surgery) Arelia Sneddon, OD (Optometry)  Review of Systems  Constitutional:  Negative for fatigue and fever.  Respiratory:  Negative for cough and shortness of breath.   Cardiovascular:  Negative for chest pain and leg swelling.  Gastrointestinal:  Negative for abdominal pain.  Neurological:  Negative for dizziness and headaches.        Objective    Vitals: BP 113/67 (BP Location: Right Arm, Patient Position: Sitting, Cuff Size: Normal)   Pulse 73   Temp 98.1 F (36.7 C) (Oral)   Ht '5\' 6"'$  (1.676 m)   Wt 174 lb 11.2 oz (79.2 kg)   SpO2 100%   BMI 28.20 kg/m   Physical Exam Constitutional:      General: She is awake.     Appearance: She is well-developed. She is not ill-appearing.  HENT:     Head: Normocephalic.     Right Ear: Tympanic membrane normal.     Left Ear: Tympanic membrane normal.     Nose: Nose normal. No congestion or rhinorrhea.     Mouth/Throat:     Pharynx: No oropharyngeal exudate or posterior oropharyngeal erythema.  Eyes:     Conjunctiva/sclera: Conjunctivae normal.     Pupils: Pupils are equal, round, and reactive to light.  Neck:     Thyroid: No thyroid mass or thyromegaly.  Cardiovascular:     Rate and Rhythm: Normal rate and regular rhythm.     Heart sounds: Normal heart sounds.  Pulmonary:     Effort: Pulmonary  effort is normal.     Breath sounds: Normal breath sounds.  Abdominal:     Palpations: Abdomen is soft.     Tenderness: There is no abdominal tenderness.  Musculoskeletal:     Right lower leg: No swelling. No edema.     Left lower leg: No swelling. No edema.  Lymphadenopathy:     Cervical: No cervical adenopathy.  Skin:    General: Skin is warm.  Neurological:     Mental Status: She is alert and oriented to person, place, and time.  Psychiatric:        Attention and Perception: Attention normal.        Mood and Affect: Mood normal.        Speech: Speech normal.         Behavior: Behavior normal. Behavior is cooperative.    Most recent functional status assessment:    07/31/2022   10:25 AM  In your present state of health, do you have any difficulty performing the following activities:  Hearing? 0  Vision? 0  Difficulty concentrating or making decisions? 0  Walking or climbing stairs? 0  Dressing or bathing? 0  Doing errands, shopping? 0   Most recent fall risk assessment:    07/31/2022   10:25 AM  Fall Risk   Number falls in past yr: 0  Injury with Fall? 0    Most recent depression screenings:    07/31/2022   10:32 AM 01/22/2022    8:43 AM  PHQ 2/9 Scores  PHQ - 2 Score 0 0  PHQ- 9 Score 0 1   Most recent cognitive screening:    07/25/2021   10:05 AM  6CIT Screen  What Year? 0 points  What month? 0 points  What time? 0 points  Count back from 20 0 points  Months in reverse 0 points  Repeat phrase 4 points  Total Score 4 points   Most recent Audit-C alcohol use screening    07/31/2022   10:26 AM  Alcohol Use Disorder Test (AUDIT)  1. How often do you have a drink containing alcohol? 4  2. How many drinks containing alcohol do you have on a typical day when you are drinking? 0  3. How often do you have six or more drinks on one occasion? 0  AUDIT-C Score 4   A score of 3 or more in women, and 4 or more in men indicates increased risk for alcohol abuse, EXCEPT if all of the points are from question 1   No results found for any visits on 07/31/22.  Assessment & Plan     Annual wellness visit done today including the all of the following: Reviewed patient's Family Medical History Reviewed and updated list of patient's medical providers Assessment of cognitive impairment was done Assessed patient's functional ability Established a written schedule for health screening [place] Completed and Reviewed  Exercise Activities and Dietary recommendations --balanced diet high in fiber and protein, low  in sugars, carbs, fats. --physical activity/exercise 30 minutes 3-5 times a week    Immunization History  Administered Date(s) Administered   Fluad Quad(high Dose 65+) 04/08/2022   Influenza-Unspecified 04/18/2019   PFIZER(Purple Top)SARS-COV-2 Vaccination 06/23/2019, 07/15/2019   Pneumococcal Conjugate-13 09/26/2019   Pneumococcal Polysaccharide-23 08/11/2018   Td 09/19/2004   Tdap 04/25/2013    Health Maintenance  Topic Date Due   COVID-19 Vaccine (3 - 2023-24 season) 01/31/2022   COLONOSCOPY (Pts 45-38yr Insurance coverage will need to be confirmed)  02/27/2022   Zoster Vaccines- Shingrix (1 of 2) 09/05/2022 (Originally 08/04/2003)   MAMMOGRAM  03/19/2023   DTaP/Tdap/Td (3 - Td or Tdap) 04/26/2023   Medicare Annual Wellness (AWV)  07/31/2023   DEXA SCAN  03/02/2024   Pneumonia Vaccine 77+ Years old  Completed   INFLUENZA VACCINE  Completed   Hepatitis C Screening  Completed   HPV VACCINES  Aged Out     Discussed health benefits of physical activity, and encouraged her to engage in regular exercise appropriate for her age and condition.    Problem List Items Addressed This Visit       Cardiovascular and Mediastinum   Essential (primary) hypertension    Chronic, managed with lisinopril 10, hctz 12.5 Well controlled Reviewed last cmp F/u 6 mo       Relevant Medications   colestipol (COLESTID) 1 g tablet     Other   Mixed hyperlipidemia    Pt aware of The 10-year ASCVD risk score (Arnett DK, et al., 2019) is: 8.7% Declines statin  Will repeat fasting in 6 mo        Relevant Medications   colestipol (COLESTID) 1 g tablet   Other Visit Diagnoses     Encounter for Medicare annual wellness exam    -  Primary   Encounter for annual physical exam       Colon cancer screening       Relevant Orders   Ambulatory referral to Gastroenterology       Return in about 6 months (around 01/29/2023) for hypertension.    I, Mikey Kirschner, PA-C have reviewed all  documentation for this visit. The documentation on  07/31/22 for the exam, diagnosis, procedures, and orders are all accurate and complete.  Mikey Kirschner, PA-C [facility] [address] [place], [place], [address] Office: [phone] Fax: [place]

## 2022-07-31 ENCOUNTER — Ambulatory Visit (INDEPENDENT_AMBULATORY_CARE_PROVIDER_SITE_OTHER): Payer: Medicare HMO | Admitting: Physician Assistant

## 2022-07-31 ENCOUNTER — Encounter: Payer: Self-pay | Admitting: Physician Assistant

## 2022-07-31 VITALS — BP 113/67 | HR 73 | Temp 98.1°F | Ht 66.0 in | Wt 174.7 lb

## 2022-07-31 DIAGNOSIS — Z0000 Encounter for general adult medical examination without abnormal findings: Secondary | ICD-10-CM

## 2022-07-31 DIAGNOSIS — E782 Mixed hyperlipidemia: Secondary | ICD-10-CM

## 2022-07-31 DIAGNOSIS — I10 Essential (primary) hypertension: Secondary | ICD-10-CM

## 2022-07-31 DIAGNOSIS — Z1211 Encounter for screening for malignant neoplasm of colon: Secondary | ICD-10-CM

## 2022-07-31 NOTE — Assessment & Plan Note (Signed)
Chronic, managed with lisinopril 10, hctz 12.5 Well controlled Reviewed last cmp F/u 6 mo

## 2022-07-31 NOTE — Assessment & Plan Note (Signed)
Pt aware of The 10-year ASCVD risk score (Arnett DK, et al., 2019) is: 8.7% Declines statin  Will repeat fasting in 6 mo

## 2022-09-13 ENCOUNTER — Other Ambulatory Visit: Payer: Self-pay | Admitting: Physician Assistant

## 2022-09-13 DIAGNOSIS — M1990 Unspecified osteoarthritis, unspecified site: Secondary | ICD-10-CM

## 2022-09-15 NOTE — Telephone Encounter (Signed)
Requested Prescriptions  Pending Prescriptions Disp Refills   celecoxib (CELEBREX) 200 MG capsule [Pharmacy Med Name: CELECOXIB 200 MG CAPSULE] 90 capsule 0    Sig: TAKE 1 CAPSULE BY MOUTH DAILY     Analgesics:  COX2 Inhibitors Failed - 09/13/2022  6:52 AM      Failed - Manual Review: Labs are only required if the patient has taken medication for more than 8 weeks.      Passed - HGB in normal range and within 360 days    Hemoglobin  Date Value Ref Range Status  03/25/2022 12.1 12.0 - 15.0 g/dL Final  71/69/6789 38.1 11.1 - 15.9 g/dL Final         Passed - Cr in normal range and within 360 days    Creatinine, Ser  Date Value Ref Range Status  03/25/2022 0.82 0.44 - 1.00 mg/dL Final         Passed - HCT in normal range and within 360 days    HCT  Date Value Ref Range Status  03/25/2022 36.9 36.0 - 46.0 % Final   Hematocrit  Date Value Ref Range Status  07/24/2020 40.1 34.0 - 46.6 % Final         Passed - AST in normal range and within 360 days    AST  Date Value Ref Range Status  03/25/2022 29 15 - 41 U/L Final         Passed - ALT in normal range and within 360 days    ALT  Date Value Ref Range Status  03/25/2022 33 0 - 44 U/L Final         Passed - eGFR is 30 or above and within 360 days    GFR calc Af Amer  Date Value Ref Range Status  07/24/2020 76 >59 mL/min/1.73 Final    Comment:    **In accordance with recommendations from the NKF-ASN Task force,**   [facility] is in the process of updating its eGFR calculation to the   2021 CKD-EPI creatinine equation that estimates kidney function   without a race variable.    GFR, Estimated  Date Value Ref Range Status  03/25/2022 >60 >60 mL/min Final    Comment:    (NOTE) Calculated using the CKD-EPI Creatinine Equation (2021)    eGFR  Date Value Ref Range Status  01/22/2022 64 >59 mL/min/1.73 Final         Passed - Patient is not pregnant      Passed - Valid encounter within last 12 months    Recent  Outpatient Visits           1 month ago Encounter for [employer] annual wellness exam   [facility] Ok Edwards, [place], PA-C   4 months ago Acute cystitis with hematuria   [facility] Alfredia Ferguson, PA-C   7 months ago Essential (primary) hypertension   [facility] Ok Edwards, [place], PA-C   7 months ago Essential (primary) hypertension   [facility] Alfredia Ferguson, PA-C   1 year ago Encounter for [employer] annual wellness exam   [facility] Kelly Nguyen, Kelly Schlein, Kelly Nguyen       Future Appointments             In 2 months Kelly Allan, Kelly Nguyen [facility] [place], [place] [facility]

## 2022-10-05 ENCOUNTER — Other Ambulatory Visit: Payer: Self-pay | Admitting: Physician Assistant

## 2022-10-05 DIAGNOSIS — I10 Essential (primary) hypertension: Secondary | ICD-10-CM

## 2022-10-13 DIAGNOSIS — M7581 Other shoulder lesions, right shoulder: Secondary | ICD-10-CM | POA: Diagnosis not present

## 2022-10-13 DIAGNOSIS — M25512 Pain in left shoulder: Secondary | ICD-10-CM | POA: Diagnosis not present

## 2022-10-13 DIAGNOSIS — M75121 Complete rotator cuff tear or rupture of right shoulder, not specified as traumatic: Secondary | ICD-10-CM | POA: Diagnosis not present

## 2022-10-13 DIAGNOSIS — M7582 Other shoulder lesions, left shoulder: Secondary | ICD-10-CM | POA: Diagnosis not present

## 2022-10-13 DIAGNOSIS — M75122 Complete rotator cuff tear or rupture of left shoulder, not specified as traumatic: Secondary | ICD-10-CM | POA: Diagnosis not present

## 2022-10-13 DIAGNOSIS — M25511 Pain in right shoulder: Secondary | ICD-10-CM | POA: Diagnosis not present

## 2022-11-03 ENCOUNTER — Encounter: Payer: Self-pay | Admitting: Gastroenterology

## 2022-11-09 NOTE — H&P (Signed)
Pre-Procedure H&P   Patient ID: Kelly Nguyen is a 67 y.o. female.  Gastroenterology Provider: Jaynie Collins, DO  Referring Provider: Tawni Pummel, PA PCP: Alfredia Ferguson, Cordelia Poche  Date: 11/09/2022  HPI [name] is a 67 y.o. female who presents today for Colonoscopy for Diarrhea, personal history of colon polyps .  Patient with history of IBS-D who notes postprandial urgency.  She experienced issues with diarrhea prior to her cholecystectomy but this worsened after this procedure in 2008.  Has started Colestid since her office visit  She notes 1-2 liquid bowel movements per day prior to starting Colestid.  Occasional leakage.  She notes no formed to her bowel movements.  She has abdominal cramping prior to bowel movement which then resolves with defecation.  No weight loss or appetite changes  No family history of colon cancer or colon polyps  Colonoscopy in September 2018 demonstrated internal hemorrhoids and 2 adenomatous polyps  Status post hysterectomy. Colonoscopy 2005 demonstrated hyperplastic polyps and left-sided diverticulosis Hemoglobin 12.1 MCV 93 platelets 267,000 creatinine 0.8   Past Medical History:  Diagnosis Date   Diaphragmatic hernia    Diverticulosis    Dysrhythmia    GERD (gastroesophageal reflux disease)    Heart murmur    Heart valve problem    History of hiatal hernia    Hyperlipidemia    Hypertension    Prolapse of uterus 2012   Stress incontinence 2012    Past Surgical History:  Procedure Laterality Date   ABDOMINAL HYSTERECTOMY     CARDIAC CATHETERIZATION     CHOLECYSTECTOMY     COLONOSCOPY WITH PROPOFOL N/A 02/27/2017   Procedure: COLONOSCOPY WITH PROPOFOL;  Surgeon: Wyline Mood, MD;  Location: [facility] ENDOSCOPY;  Service: Gastroenterology;  Laterality: N/A;   CYSTOCELE REPAIR N/A 04/10/2015   Procedure: ANTERIOR REPAIR (CYSTOCELE);  Surgeon: Nadara Mustard, MD;  Location: [facility] ORS;  Service: Gynecology;  Laterality: N/A;    HALLUX VALGUS CORRECTION Right 2004   HAMMER TOE SURGERY Right    JOINT REPLACEMENT     KNEE ARTHROPLASTY Right 05/27/2017   Procedure: COMPUTER ASSISTED TOTAL KNEE ARTHROPLASTY;  Surgeon: Donato Heinz, MD;  Location: [facility] ORS;  Service: Orthopedics;  Laterality: Right;   KNEE ARTHROPLASTY Left 04/07/2022   Procedure: COMPUTER ASSISTED TOTAL KNEE ARTHROPLASTY;  Surgeon: Donato Heinz, MD;  Location: [facility] ORS;  Service: Orthopedics;  Laterality: Left;   ROTATOR CUFF REPAIR Right 2004   SALPINGOOPHORECTOMY Bilateral 04/10/2015   Procedure: SALPINGO OOPHORECTOMY;  Surgeon: Nadara Mustard, MD;  Location: [facility] ORS;  Service: Gynecology;  Laterality: Bilateral;  vaginal   VAGINAL HYSTERECTOMY  04/10/2015   Procedure: HYSTERECTOMY VAGINAL, ;  Surgeon: Nadara Mustard, MD;  Location: [facility] ORS;  Service: Gynecology;;    Family History No h/o GI disease or malignancy  Review of Systems  Constitutional:  Negative for activity change, appetite change, chills, diaphoresis, fatigue, fever and unexpected weight change.  HENT:  Negative for trouble swallowing and voice change.   Respiratory:  Negative for shortness of breath and wheezing.   Cardiovascular:  Negative for chest pain, palpitations and leg swelling.  Gastrointestinal:  Positive for abdominal pain and diarrhea. Negative for abdominal distention, anal bleeding, blood in stool, constipation, nausea, rectal pain and vomiting.  Musculoskeletal:  Negative for arthralgias and myalgias.  Skin:  Negative for color change and pallor.  Neurological:  Negative for dizziness, syncope and weakness.  Psychiatric/Behavioral:  Negative for confusion.   All other systems reviewed and are negative.  Medications No current facility-administered medications on file prior to encounter.   Current Outpatient Medications on File Prior to Encounter  Medication Sig Dispense Refill   fluticasone (FLONASE) 50 MCG/ACT nasal spray Place 1 spray into both  nostrils daily.     lactobacillus acidophilus (BACID) TABS tablet Take 1 tablet by mouth daily.     colestipol (COLESTID) 1 g tablet Take by mouth.     Multiple Vitamins-Minerals (MULTIVITAMIN WITH MINERALS) tablet Take 1 tablet by mouth daily.     Na Sulfate-K Sulfate-Mg Sulf 17.5-3.13-1.6 GM/177ML SOLN Take by mouth.      Pertinent medications related to GI and procedure were reviewed by me with the patient prior to the procedure  No current facility-administered medications for this encounter.  Current Outpatient Medications:    fluticasone (FLONASE) 50 MCG/ACT nasal spray, Place 1 spray into both nostrils daily., Disp: , Rfl:    lactobacillus acidophilus (BACID) TABS tablet, Take 1 tablet by mouth daily., Disp: , Rfl:    celecoxib (CELEBREX) 200 MG capsule, TAKE 1 CAPSULE BY MOUTH DAILY, Disp: 90 capsule, Rfl: 0   colestipol (COLESTID) 1 g tablet, Take by mouth., Disp: , Rfl:    lisinopril-hydrochlorothiazide (ZESTORETIC) 10-12.5 MG tablet, TAKE ONE TABLET BY MOUTH DAILY, Disp: 90 tablet, Rfl: 0   Multiple Vitamins-Minerals (MULTIVITAMIN WITH MINERALS) tablet, Take 1 tablet by mouth daily., Disp: , Rfl:    Na Sulfate-K Sulfate-Mg Sulf 17.5-3.13-1.6 GM/177ML SOLN, Take by mouth., Disp: , Rfl:       Allergies  Allergen Reactions   Acyclovir And Related    Tramadol Nausea And Vomiting   Meloxicam Rash   Allergies were reviewed by me prior to the procedure  Objective   There is no height or weight on file to calculate BMI. There were no vitals filed for this visit.  *** Physical Exam Vitals and nursing note reviewed.  Constitutional:      General: She is not in acute distress.    Appearance: Normal appearance. She is not ill-appearing, toxic-appearing or diaphoretic.  HENT:     Head: Normocephalic and atraumatic.     Nose: Nose normal.     Mouth/Throat:     Mouth: Mucous membranes are moist.     Pharynx: Oropharynx is clear.  Eyes:     General: No scleral icterus.     Extraocular Movements: Extraocular movements intact.  Cardiovascular:     Rate and Rhythm: Normal rate and regular rhythm.     Heart sounds: Normal heart sounds. No murmur heard.    No friction rub. No gallop.  Pulmonary:     Effort: Pulmonary effort is normal. No respiratory distress.     Breath sounds: Normal breath sounds. No wheezing, rhonchi or rales.  Abdominal:     General: Bowel sounds are normal. There is no distension.     Palpations: Abdomen is soft.     Tenderness: There is no abdominal tenderness. There is no guarding or rebound.  Musculoskeletal:     Cervical back: Neck supple.     Right lower leg: No edema.     Left lower leg: No edema.  Skin:    General: Skin is warm and dry.     Coloration: Skin is not jaundiced or pale.  Neurological:     General: No focal deficit present.     Mental Status: She is alert and oriented to person, place, and time. Mental status is at baseline.  Psychiatric:        Mood  and Affect: Mood normal.        Behavior: Behavior normal.        Thought Content: Thought content normal.        Judgment: Judgment normal.      Assessment:  [name] is a 67 y.o. female  who presents today for Colonoscopy for Diarrhea, personal history of colon polyps .  Plan:  Colonoscopy with possible intervention today  Colonoscopy with possible biopsy, control of bleeding, polypectomy, and interventions as necessary has been discussed with the patient/patient representative. Informed consent was obtained from the patient/patient representative after explaining the indication, nature, and risks of the procedure including but not limited to death, bleeding, perforation, missed neoplasm/lesions, cardiorespiratory compromise, and reaction to medications. Opportunity for questions was given and appropriate answers were provided. Patient/patient representative has verbalized understanding is amenable to undergoing the procedure.   Jaynie Collins, DO   [facility] Gastroenterology  Portions of the record may have been created with voice recognition software. Occasional wrong-word or 'sound-a-like' substitutions may have occurred due to the inherent limitations of voice recognition software.  Read the chart carefully and recognize, using context, where substitutions may have occurred.

## 2022-11-10 ENCOUNTER — Other Ambulatory Visit: Payer: Self-pay

## 2022-11-10 ENCOUNTER — Ambulatory Visit: Payer: Medicare HMO | Admitting: Anesthesiology

## 2022-11-10 ENCOUNTER — Encounter
Admission: RE | Disposition: A | Discharge status: Home / Self Care | Payer: Self-pay | Source: Home / Self Care | Attending: Gastroenterology

## 2022-11-10 ENCOUNTER — Encounter: Payer: Self-pay | Admitting: Gastroenterology

## 2022-11-10 ENCOUNTER — Ambulatory Visit
Admission: RE | Admit: 2022-11-10 | Discharge: 2022-11-10 | Disposition: A | Discharge status: Home / Self Care | Payer: Medicare HMO | Attending: Gastroenterology | Admitting: Gastroenterology

## 2022-11-10 DIAGNOSIS — D123 Benign neoplasm of transverse colon: Secondary | ICD-10-CM | POA: Insufficient documentation

## 2022-11-10 DIAGNOSIS — D122 Benign neoplasm of ascending colon: Secondary | ICD-10-CM | POA: Insufficient documentation

## 2022-11-10 DIAGNOSIS — K219 Gastro-esophageal reflux disease without esophagitis: Secondary | ICD-10-CM | POA: Insufficient documentation

## 2022-11-10 DIAGNOSIS — K449 Diaphragmatic hernia without obstruction or gangrene: Secondary | ICD-10-CM | POA: Insufficient documentation

## 2022-11-10 DIAGNOSIS — I10 Essential (primary) hypertension: Secondary | ICD-10-CM | POA: Diagnosis not present

## 2022-11-10 DIAGNOSIS — Z86010 Personal history of colonic polyps: Secondary | ICD-10-CM | POA: Diagnosis not present

## 2022-11-10 DIAGNOSIS — D124 Benign neoplasm of descending colon: Secondary | ICD-10-CM | POA: Diagnosis not present

## 2022-11-10 DIAGNOSIS — Z1211 Encounter for screening for malignant neoplasm of colon: Secondary | ICD-10-CM | POA: Insufficient documentation

## 2022-11-10 DIAGNOSIS — K635 Polyp of colon: Secondary | ICD-10-CM | POA: Diagnosis not present

## 2022-11-10 DIAGNOSIS — K589 Irritable bowel syndrome without diarrhea: Secondary | ICD-10-CM | POA: Insufficient documentation

## 2022-11-10 DIAGNOSIS — K5730 Diverticulosis of large intestine without perforation or abscess without bleeding: Secondary | ICD-10-CM | POA: Insufficient documentation

## 2022-11-10 DIAGNOSIS — R011 Cardiac murmur, unspecified: Secondary | ICD-10-CM | POA: Insufficient documentation

## 2022-11-10 DIAGNOSIS — K649 Unspecified hemorrhoids: Secondary | ICD-10-CM | POA: Diagnosis not present

## 2022-11-10 DIAGNOSIS — D120 Benign neoplasm of cecum: Secondary | ICD-10-CM | POA: Diagnosis not present

## 2022-11-10 DIAGNOSIS — E785 Hyperlipidemia, unspecified: Secondary | ICD-10-CM | POA: Diagnosis not present

## 2022-11-10 DIAGNOSIS — D125 Benign neoplasm of sigmoid colon: Secondary | ICD-10-CM | POA: Diagnosis not present

## 2022-11-10 DIAGNOSIS — R197 Diarrhea, unspecified: Secondary | ICD-10-CM | POA: Diagnosis not present

## 2022-11-10 HISTORY — DX: Diverticulosis of intestine, part unspecified, without perforation or abscess without bleeding: K57.90

## 2022-11-10 HISTORY — DX: Hyperlipidemia, unspecified: E78.5

## 2022-11-10 HISTORY — DX: Diaphragmatic hernia without obstruction or gangrene: K44.9

## 2022-11-10 HISTORY — PX: COLONOSCOPY WITH PROPOFOL: SHX5780

## 2022-11-10 SURGERY — COLONOSCOPY WITH PROPOFOL
Anesthesia: General

## 2022-11-10 MED ORDER — LIDOCAINE HCL (CARDIAC) PF 100 MG/5ML IV SOSY
PREFILLED_SYRINGE | INTRAVENOUS | Status: DC | PRN
  Administered 2022-11-10: 50 mg via INTRAVENOUS

## 2022-11-10 MED ORDER — PHENYLEPHRINE HCL (PRESSORS) 10 MG/ML IV SOLN
INTRAVENOUS | Status: DC | PRN
  Administered 2022-11-10: 80 ug via INTRAVENOUS

## 2022-11-10 MED ORDER — PROPOFOL 500 MG/50ML IV EMUL
INTRAVENOUS | Status: DC | PRN
  Administered 2022-11-10: 150 ug/kg/min via INTRAVENOUS

## 2022-11-10 MED ORDER — PROPOFOL 10 MG/ML IV BOLUS
INTRAVENOUS | Status: DC | PRN
  Administered 2022-11-10: 70 mg via INTRAVENOUS

## 2022-11-10 MED ORDER — DEXMEDETOMIDINE HCL IN NACL 80 MCG/20ML IV SOLN
INTRAVENOUS | Status: DC | PRN
  Administered 2022-11-10: 12 ug via INTRAVENOUS

## 2022-11-10 MED ORDER — SODIUM CHLORIDE 0.9 % IV SOLN: INTRAVENOUS | Status: DC

## 2022-11-10 NOTE — Transfer of Care (Signed)
Immediate Anesthesia Transfer of Care Note  Patient: Kelly Nguyen  Procedure(s) Performed: COLONOSCOPY WITH PROPOFOL  Patient Location: Endoscopy Unit  Anesthesia Type:General  Level of Consciousness: drowsy  Airway & Oxygen Therapy: Patient Spontanous Breathing and Patient connected to nasal cannula oxygen  Post-op Assessment: Report given to RN, Post -op Vital signs reviewed and stable, and Patient moving all extremities  Post vital signs: Reviewed and stable  Last Vitals:  Vitals Value Taken Time  BP    Temp 35.9 C 11/10/22 1444  Pulse 73 11/10/22 1444  Resp 15 11/10/22 1444  SpO2 96 % 11/10/22 1444    Last Pain:  Vitals:   11/10/22 1444  TempSrc: Temporal  PainSc: Asleep         Complications: No notable events documented.

## 2022-11-10 NOTE — Anesthesia Preprocedure Evaluation (Signed)
Anesthesia Evaluation  Patient identified by MRN, date of birth, ID band Patient awake    Reviewed: Allergy & Precautions, NPO status , Patient's Chart, lab work & pertinent test results  History of Anesthesia Complications Negative for: history of anesthetic complications  Airway Mallampati: II  TM Distance: >3 FB Neck ROM: full    Dental no notable dental hx.    Pulmonary neg pulmonary ROS   Pulmonary exam normal        Cardiovascular hypertension, On Medications negative cardio ROS Normal cardiovascular exam     Neuro/Psych  Neuromuscular disease  negative psych ROS   GI/Hepatic Neg liver ROS, hiatal hernia,GERD  Controlled,,  Endo/Other  negative endocrine ROS    Renal/GU negative Renal ROS  negative genitourinary   Musculoskeletal   Abdominal   Peds  Hematology negative hematology ROS (+)   Anesthesia Other Findings Past Medical History: No date: Diaphragmatic hernia No date: Diverticulosis No date: Dysrhythmia No date: GERD (gastroesophageal reflux disease) No date: Heart murmur No date: Heart valve problem No date: History of hiatal hernia No date: Hyperlipidemia No date: Hypertension 2012: Prolapse of uterus 2012: Stress incontinence  Past Surgical History: No date: ABDOMINAL HYSTERECTOMY No date: CARDIAC CATHETERIZATION No date: CHOLECYSTECTOMY 02/27/2017: COLONOSCOPY WITH PROPOFOL; N/A     Comment:  Procedure: COLONOSCOPY WITH PROPOFOL;  Surgeon: Wyline Mood, MD;  Location: [facility] ENDOSCOPY;  Service:               Gastroenterology;  Laterality: N/A; 04/10/2015: CYSTOCELE REPAIR; N/A     Comment:  Procedure: ANTERIOR REPAIR (CYSTOCELE);  Surgeon: Nadara Mustard, MD;  Location: [facility] ORS;  Service: Gynecology;               Laterality: N/A; 2004: HALLUX VALGUS CORRECTION; Right No date: HAMMER TOE SURGERY; Right No date: JOINT REPLACEMENT 05/27/2017: KNEE  ARTHROPLASTY; Right     Comment:  Procedure: COMPUTER ASSISTED TOTAL KNEE ARTHROPLASTY;                Surgeon: Donato Heinz, MD;  Location: [facility] ORS;                Service: Orthopedics;  Laterality: Right; 04/07/2022: KNEE ARTHROPLASTY; Left     Comment:  Procedure: COMPUTER ASSISTED TOTAL KNEE ARTHROPLASTY;                Surgeon: Donato Heinz, MD;  Location: [facility] ORS;                Service: Orthopedics;  Laterality: Left; 2004: ROTATOR CUFF REPAIR; Right 04/10/2015: SALPINGOOPHORECTOMY; Bilateral     Comment:  Procedure: SALPINGO OOPHORECTOMY;  Surgeon: Nadara Mustard, MD;  Location: [facility] ORS;  Service: Gynecology;                Laterality: Bilateral;  vaginal 04/10/2015: VAGINAL HYSTERECTOMY     Comment:  Procedure: HYSTERECTOMY VAGINAL, ;  Surgeon: Nadara Mustard, MD;  Location: [facility] ORS;  Service: Gynecology;;     Reproductive/Obstetrics negative OB ROS  Anesthesia Physical Anesthesia Plan  ASA: 2  Anesthesia Plan: General   Post-op Pain Management: Minimal or no pain anticipated   Induction: Intravenous  PONV Risk Score and Plan: Propofol infusion and TIVA  Airway Management Planned: Natural Airway and Nasal Cannula  Additional Equipment:   Intra-op Plan:   Post-operative Plan:   Informed Consent: I have reviewed the patients History and Physical, chart, labs and discussed the procedure including the risks, benefits and alternatives for the proposed anesthesia with the patient or authorized representative who has indicated his/her understanding and acceptance.     Dental Advisory Given  Plan Discussed with: Anesthesiologist, CRNA and Surgeon  Anesthesia Plan Comments: (Patient consented for risks of anesthesia including but not limited to:  - adverse reactions to medications - risk of airway placement if required - damage to eyes, teeth, lips or other oral mucosa -  nerve damage due to positioning  - sore throat or hoarseness - Damage to heart, brain, nerves, lungs, other parts of body or loss of life  Patient voiced understanding.)       Anesthesia Quick Evaluation

## 2022-11-10 NOTE — Op Note (Addendum)
[facility] Gastroenterology Patient Name: Kelly Nguyen Procedure Date: 11/10/2022 2:01 PM MRN: [id number] Account #: [id number] Date of Birth: 05-Jun-1955 Admit Type: Outpatient Age: 67 Room: [facility] ENDO ROOM 2 Gender: Female Note Status: Finalized Instrument Name: Colonoscope [id number] Procedure:             Colonoscopy Indications:           High risk colon cancer surveillance: Personal history                         of colonic polyps Providers:             Jaynie Collins DO, DO Medicines:             Monitored Anesthesia Care Complications:         No immediate complications. Estimated blood loss:                         Minimal. Procedure:             Pre-Anesthesia Assessment:                        - Prior to the procedure, a History and Physical was                         performed, and patient medications and allergies were                         reviewed. The patient is competent. The risks and                         benefits of the procedure and the sedation options and                         risks were discussed with the patient. All questions                         were answered and informed consent was obtained.                         Patient identification and proposed procedure were                         verified by the physician, the nurse, the anesthetist                         and the technician in the endoscopy suite. Mental                         Status Examination: alert and oriented. Airway                         Examination: normal oropharyngeal airway and neck                         mobility. Respiratory Examination: clear to                         auscultation. CV Examination: RRR, no murmurs, no S3  or S4. Prophylactic Antibiotics: The patient does not                         require prophylactic antibiotics. Prior                         Anticoagulants: The patient has taken no anticoagulant                          or antiplatelet agents. ASA Grade Assessment: II - A                         patient with mild systemic disease. After reviewing                         the risks and benefits, the patient was deemed in                         satisfactory condition to undergo the procedure. The                         anesthesia plan was to use monitored anesthesia care                         (MAC). Immediately prior to administration of                         medications, the patient was re-assessed for adequacy                         to receive sedatives. The heart rate, respiratory                         rate, oxygen saturations, blood pressure, adequacy of                         pulmonary ventilation, and response to care were                         monitored throughout the procedure. The physical                         status of the patient was re-assessed after the                         procedure.                        After obtaining informed consent, the colonoscope was                         passed under direct vision. Throughout the procedure,                         the patient's blood pressure, pulse, and oxygen                         saturations were monitored continuously. The  Colonoscope was introduced through the anus and                         advanced to the the cecum, identified by appendiceal                         orifice and ileocecal valve. The colonoscopy was                         performed without difficulty. The patient tolerated                         the procedure well. The quality of the bowel                         preparation was evaluated using the BBPS [facility] Bowel                         Preparation Scale) with scores of: Right Colon = 3,                         Transverse Colon = 3 and Left Colon = 3 (entire mucosa                         seen well with no residual staining, small fragments                          of stool or opaque liquid). The total BBPS score                         equals 9. The ileocecal valve, appendiceal orifice,                         and rectum were photographed. Findings:      The perianal and digital rectal examinations were normal. Pertinent       negatives include normal sphincter tone.      Seven sessile polyps were found in the sigmoid colon, descending colon,       transverse colon, ascending colon and cecum. The polyps were 1 to 2 mm       in size. These polyps were removed with a jumbo cold forceps. Resection       and retrieval were complete. Estimated blood loss was minimal.      Two sessile polyps were found in the sigmoid colon and transverse colon.       The polyps were 3 to 5 mm in size. These polyps were removed with a cold       snare. Resection and retrieval were complete. Estimated blood loss was       minimal.      A 10 to 11 mm polyp was found in the cecum. The polyp was sessile. The       polyp was removed with a cold snare. Resection and retrieval were       complete. Estimated blood loss was minimal. To prevent bleeding after       the polypectomy, one hemostatic clip was successfully placed (MR       conditional). There was no bleeding at the end of the procedure.  Estimated blood loss: none.      Multiple small-mouthed diverticula were found in the left colon.       Estimated blood loss: none.      The exam was otherwise without abnormality on direct and retroflexion       views. Impression:            - Seven 1 to 2 mm polyps in the sigmoid colon, in the                         descending colon, in the transverse colon, in the                         ascending colon and in the cecum, removed with a jumbo                         cold forceps. Resected and retrieved.                        - Two 3 to 5 mm polyps in the sigmoid colon and in the                         transverse colon, removed with a cold snare. Resected                          and retrieved.                        - One 10 to 11 mm polyp in the cecum, removed with a                         cold snare. Resected and retrieved. Clip (MR                         conditional) was placed.                        - Diverticulosis in the left colon.                        - The examination was otherwise normal on direct and                         retroflexion views. Recommendation:        - Patient has a contact number available for                         emergencies. The signs and symptoms of potential                         delayed complications were discussed with the patient.                         Return to normal activities tomorrow. Written                         discharge instructions were provided to the patient.                        -  Discharge patient to home.                        - Resume previous diet.                        - Continue present medications.                        - No ibuprofen, naproxen, or other non-steroidal                         anti-inflammatory drugs for 5 days after polyp removal.                        - Await pathology results.                        - Repeat colonoscopy in 1 year for surveillance based                         on pathology results.                        - Return to referring physician as previously                         scheduled.                        - The findings and recommendations were discussed with                         the patient. Procedure Code(s):     --- Professional ---                        [phone], Colonoscopy, flexible; with removal of                         tumor(s), polyp(s), or other lesion(s) by snare                         technique                        45380, 59, Colonoscopy, flexible; with biopsy, single                         or multiple Diagnosis Code(s):     --- Professional ---                        Z86.010, Personal history of colonic polyps                         D12.4, Benign neoplasm of descending colon                        D12.2, Benign neoplasm of ascending colon                        D12.0, Benign neoplasm of cecum  D12.5, Benign neoplasm of sigmoid colon                        D12.3, Benign neoplasm of transverse colon (hepatic                         flexure or splenic flexure)                        K57.30, Diverticulosis of large intestine without                         perforation or abscess without bleeding CPT copyright 2022 American Medical Association. All rights reserved. The codes documented in this report are preliminary and upon coder review may  be revised to meet current compliance requirements. Attending Participation:      I personally performed the entire procedure. Elfredia Nevins, DO Jaynie Collins DO, DO 11/10/2022 2:47:58 PM This report has been signed electronically. Number of Addenda: 0 Note Initiated On: 11/10/2022 2:01 PM Scope Withdrawal Time: 0 hours 18 minutes 16 seconds  Total Procedure Duration: 0 hours 27 minutes 46 seconds  Estimated Blood Loss:  Estimated blood loss was minimal.      [facility]

## 2022-11-10 NOTE — Anesthesia Procedure Notes (Signed)
Procedure Name: MAC Date/Time: 11/10/2022 2:08 PM  Performed by: Cheral Bay, CRNAPre-anesthesia Checklist: Patient identified, Emergency Drugs available, Suction available, Patient being monitored and Timeout performed Patient Re-evaluated:Patient Re-evaluated prior to induction Oxygen Delivery Method: Nasal cannula Induction Type: IV induction Placement Confirmation: positive ETCO2 and CO2 detector

## 2022-11-10 NOTE — Interval H&P Note (Signed)
History and Physical Interval Note: Preprocedure H&P from 11/10/22  was reviewed and there was no interval change after seeing and examining the patient.  Written consent was obtained from the patient after discussion of risks, benefits, and alternatives. Patient has consented to proceed with Colonoscopy with possible intervention   11/10/2022 1:09 PM  Kelly Nguyen  has presented today for surgery, with the diagnosis of V12.72 (ICD-9-CM) - Z86.010 (ICD-10-CM) - History of colon polyps 787.91 (ICD-9-CM) - R19.7 (ICD-10-CM) - Diarrhea, unspecified type.  The various methods of treatment have been discussed with the patient and family. After consideration of risks, benefits and other options for treatment, the patient has consented to  Procedure(s): COLONOSCOPY WITH PROPOFOL (N/A) as a surgical intervention.  The patient's history has been reviewed, patient examined, no change in status, stable for surgery.  I have reviewed the patient's chart and labs.  Questions were answered to the patient's satisfaction.     Jaynie Collins

## 2022-11-11 ENCOUNTER — Encounter: Payer: Self-pay | Admitting: Gastroenterology

## 2022-11-11 DIAGNOSIS — Z86010 Personal history of colonic polyps: Secondary | ICD-10-CM | POA: Diagnosis not present

## 2022-11-11 DIAGNOSIS — K635 Polyp of colon: Secondary | ICD-10-CM | POA: Diagnosis not present

## 2022-11-11 DIAGNOSIS — K5730 Diverticulosis of large intestine without perforation or abscess without bleeding: Secondary | ICD-10-CM | POA: Diagnosis not present

## 2022-11-11 DIAGNOSIS — R197 Diarrhea, unspecified: Secondary | ICD-10-CM | POA: Diagnosis not present

## 2022-11-11 NOTE — Anesthesia Postprocedure Evaluation (Signed)
Anesthesia Post Note  Patient: Kelly Nguyen  Procedure(s) Performed: COLONOSCOPY WITH PROPOFOL  Patient location during evaluation: Endoscopy Anesthesia Type: General Level of consciousness: awake and alert Pain management: pain level controlled Vital Signs Assessment: post-procedure vital signs reviewed and stable Respiratory status: spontaneous breathing, nonlabored ventilation, respiratory function stable and patient connected to nasal cannula oxygen Cardiovascular status: blood pressure returned to baseline and stable Postop Assessment: no apparent nausea or vomiting Anesthetic complications: no   No notable events documented.   Last Vitals:  Vitals:   11/10/22 1454 11/10/22 1504  BP: 96/60 (!) 103/47  Pulse: 70 63  Resp: 15 17  Temp:    SpO2: 98% 100%    Last Pain:  Vitals:   11/10/22 1504  TempSrc:   PainSc: 0-No pain                 Corinda Gubler

## 2022-11-18 ENCOUNTER — Encounter: Payer: Self-pay | Admitting: Family Medicine

## 2022-11-18 ENCOUNTER — Ambulatory Visit (INDEPENDENT_AMBULATORY_CARE_PROVIDER_SITE_OTHER): Payer: Medicare HMO | Admitting: Family Medicine

## 2022-11-18 VITALS — BP 138/88 | HR 52 | Temp 98.4°F | Ht 66.0 in | Wt 175.2 lb

## 2022-11-18 DIAGNOSIS — E559 Vitamin D deficiency, unspecified: Secondary | ICD-10-CM

## 2022-11-18 DIAGNOSIS — M2550 Pain in unspecified joint: Secondary | ICD-10-CM

## 2022-11-18 DIAGNOSIS — R7309 Other abnormal glucose: Secondary | ICD-10-CM

## 2022-11-18 DIAGNOSIS — Z780 Asymptomatic menopausal state: Secondary | ICD-10-CM | POA: Diagnosis not present

## 2022-11-18 DIAGNOSIS — E782 Mixed hyperlipidemia: Secondary | ICD-10-CM | POA: Diagnosis not present

## 2022-11-18 DIAGNOSIS — Z1231 Encounter for screening mammogram for malignant neoplasm of breast: Secondary | ICD-10-CM | POA: Diagnosis not present

## 2022-11-18 DIAGNOSIS — K529 Noninfective gastroenteritis and colitis, unspecified: Secondary | ICD-10-CM | POA: Diagnosis not present

## 2022-11-18 DIAGNOSIS — K5730 Diverticulosis of large intestine without perforation or abscess without bleeding: Secondary | ICD-10-CM

## 2022-11-18 DIAGNOSIS — I10 Essential (primary) hypertension: Secondary | ICD-10-CM | POA: Diagnosis not present

## 2022-11-18 DIAGNOSIS — K5140 Inflammatory polyps of colon without complications: Secondary | ICD-10-CM

## 2022-11-18 DIAGNOSIS — M1990 Unspecified osteoarthritis, unspecified site: Secondary | ICD-10-CM

## 2022-11-18 DIAGNOSIS — L309 Dermatitis, unspecified: Secondary | ICD-10-CM | POA: Diagnosis not present

## 2022-11-18 MED ORDER — CELECOXIB 100 MG PO CAPS: 100.0000 mg | ORAL_CAPSULE | Freq: Every day | ORAL | 3 refills | Status: DC

## 2022-11-18 MED ORDER — COLESTIPOL HCL 1 G PO TABS
1.0000 g | ORAL_TABLET | Freq: Every day | ORAL | 3 refills | Status: AC
Start: 2022-11-18 — End: ?

## 2022-11-18 MED ORDER — LISINOPRIL-HYDROCHLOROTHIAZIDE 10-12.5 MG PO TABS: 1.0000 | ORAL_TABLET | Freq: Every day | ORAL | 1 refills | Status: DC

## 2022-11-18 MED ORDER — TRIAMCINOLONE ACETONIDE 0.1 % EX CREA
1.0000 | TOPICAL_CREAM | Freq: Two times a day (BID) | CUTANEOUS | 0 refills | Status: DC
Start: 2022-11-18 — End: 2023-03-23

## 2022-11-18 MED ORDER — OMEPRAZOLE 20 MG PO CPDR
20.0000 mg | DELAYED_RELEASE_CAPSULE | Freq: Every day | ORAL | 3 refills | Status: DC
Start: 2022-11-18 — End: 2023-03-24

## 2022-11-18 NOTE — Patient Instructions (Addendum)
It was a pleasure meeting you today. Thank you for allowing me to take part in your health care.  Our goals for today as we discussed include:  Triamcinolone cream to affected area two times a day for 5 days  Refills sent for medications  Decrease Celebrex to 100 mg daily Start Omeprazole 20 mg daily while on Celebrex to help prevent stomach ulcers  Follow up in 4 months Schedule fasting blood work 1 week prior to office visit  Referral sent for Mammogram and Dexa scan. Please call to schedule appointment. Due 03/19/2023  [facility] [address] [place], [place] [address] [phone]     If you have any questions or concerns, please do not hesitate to call the office at [phone].  I look forward to our next visit and until then take care and stay safe.  Regards,   Dana Allan, MD   [facility]

## 2022-11-18 NOTE — Progress Notes (Signed)
SUBJECTIVE:   Chief Complaint  Patient presents with   Establish Care   HPI Patient presents to clinic to establish care  No acute concerns today  Hypertension Asymptomatic. Takes Zestoretic 10-12.5 mg daily and tolerating well.  Does not check BP at home. Requesting refills.  Osteoarthritis  Takes Celebrex 200 mg daily.  Not currently on PPI for GI protection.  Follows with Orthopedics for chronic bilateral shoulder pain.  Requesting refills  Follows with GI at [facility] for IBS. Takes Colestid 1 gm daily. Requesting refills  Chigger Bite Requesting refill for triamcinolone for dermatitis from recent Chigger bites. Has had similar symptoms in past after gardening.  Endorses continued itching and reddened areas where insect bites despite use of OTC cortisol.  Had some mild relief.  PERTINENT PMH / PSH: OA HTN IBS Dermatitis Diverticulosis   OBJECTIVE:  BP 138/88   Pulse (!) 52   Temp 98.4 F (36.9 C)   Ht 5\' 6"  (1.676 m)   Wt 175 lb 3.2 oz (79.5 kg)   SpO2 98%   BMI 28.28 kg/m    Physical Exam Vitals reviewed.  Constitutional:      General: She is not in acute distress.    Appearance: She is not ill-appearing.  HENT:     Head: Normocephalic.     Right Ear: Tympanic membrane, ear canal and external ear normal.     Left Ear: Tympanic membrane, ear canal and external ear normal.     Nose: Nose normal.     Mouth/Throat:     Mouth: Mucous membranes are moist.  Eyes:     Extraocular Movements: Extraocular movements intact.     Conjunctiva/sclera: Conjunctivae normal.     Pupils: Pupils are equal, round, and reactive to light.  Neck:     Vascular: No carotid bruit.  Cardiovascular:     Rate and Rhythm: Normal rate and regular rhythm.     Pulses: Normal pulses.     Heart sounds: Normal heart sounds.  Pulmonary:     Effort: Pulmonary effort is normal.     Breath sounds: Normal breath sounds.  Abdominal:     General: Bowel sounds are normal. There is no  distension.     Palpations: Abdomen is soft.     Tenderness: There is no abdominal tenderness. There is no right CVA tenderness, left CVA tenderness, guarding or rebound.  Musculoskeletal:        General: Normal range of motion.     Cervical back: Normal range of motion.     Right lower leg: No edema.     Left lower leg: No edema.  Lymphadenopathy:     Cervical: No cervical adenopathy.  Skin:    Capillary Refill: Capillary refill takes less than 2 seconds.     Findings: Erythema present.  Neurological:     General: No focal deficit present.     Mental Status: She is alert and oriented to person, place, and time. Mental status is at baseline.     Motor: No weakness.  Psychiatric:        Mood and Affect: Mood normal.        Behavior: Behavior normal.        Thought Content: Thought content normal.        Judgment: Judgment normal.     ASSESSMENT/PLAN:  Essential (primary) hypertension Assessment & Plan: Chronic. Well controlled on current medication Refill Zestoretic  Check Cmet  Orders: -     Lisinopril-hydroCHLOROthiazide; Take 1  tablet by mouth daily.  Dispense: 90 tablet; Refill: 1 -     Comprehensive metabolic panel; Future  Mixed hyperlipidemia Assessment & Plan: Chronic Not on statin therapy.  Last lipids checked 2023 The 10-year ASCVD risk score (Arnett DK, et al., 2019) is: 13.9%  Check fasting lipids Consider discussion of statin therapy at next visit  Orders: -     Lipid panel; Future  Arthralgia of multiple joints Assessment & Plan: Chronic.  History of HTN Recommend decrease Celebrex to 100 mg daily as needed Start Omeprazole 20 mg daily for GI protection Follow up with Orthopedics for continued management    Arthritis -     Celecoxib; Take 1 capsule (100 mg total) by mouth daily.  Dispense: 30 capsule; Refill: 3 -     Omeprazole; Take 1 capsule (20 mg total) by mouth daily.  Dispense: 30 capsule; Refill: 3 -     CBC with Differential/Platelet;  Future  Chronic diarrhea -     Colestipol HCl; Take 1 tablet (1 g total) by mouth daily.  Dispense: 30 tablet; Refill: 3  Vitamin D deficiency -     VITAMIN D 25 Hydroxy (Vit-D Deficiency, Fractures); Future  Abnormal glucose Assessment & Plan: Check A1c  Orders: -     Hemoglobin A1c; Future  Dermatitis Assessment & Plan: Symptoms started after gardening and bitten by insects Have been improving but continues to have itch  Start Triamcinolone cream BID x 5 days then as needed If no improvement follow up with PCP  Orders: -     Triamcinolone Acetonide; Apply 1 Application topically 2 (two) times daily.  Dispense: 30 g; Refill: 0  Postmenopausal estrogen deficiency -     DG Bone Density; Future  Breast cancer screening by mammogram -     3D Screening Mammogram, Left and Right; Future  Colon, diverticulosis Assessment & Plan: Follows with [facility]    Pseudopolyposis of colon without complication, unspecified part of colon [facility]) Assessment & Plan: Recent Colonoscopy 2024 Recommend repeat in  1 year pending biopsy Continue to follow up with [facility]    PDMP reviewed  Return in about 4 months (around 03/20/2023).  Dana Allan, MD

## 2022-12-14 ENCOUNTER — Encounter: Payer: Self-pay | Admitting: Family Medicine

## 2022-12-14 DIAGNOSIS — Z1231 Encounter for screening mammogram for malignant neoplasm of breast: Secondary | ICD-10-CM | POA: Insufficient documentation

## 2022-12-14 DIAGNOSIS — L309 Dermatitis, unspecified: Secondary | ICD-10-CM | POA: Insufficient documentation

## 2022-12-14 DIAGNOSIS — E559 Vitamin D deficiency, unspecified: Secondary | ICD-10-CM | POA: Insufficient documentation

## 2022-12-14 DIAGNOSIS — Z780 Asymptomatic menopausal state: Secondary | ICD-10-CM | POA: Insufficient documentation

## 2022-12-14 DIAGNOSIS — R7309 Other abnormal glucose: Secondary | ICD-10-CM | POA: Insufficient documentation

## 2022-12-14 NOTE — Assessment & Plan Note (Signed)
Recent Colonoscopy 2024 Recommend repeat in  1 year pending biopsy Continue to follow up with [facility]

## 2022-12-14 NOTE — Assessment & Plan Note (Addendum)
Chronic. Well controlled on current medication Refill Zestoretic  Check Cmet

## 2022-12-14 NOTE — Assessment & Plan Note (Signed)
Check A1c. 

## 2022-12-14 NOTE — Assessment & Plan Note (Signed)
Chronic.  History of HTN Recommend decrease Celebrex to 100 mg daily as needed Start Omeprazole 20 mg daily for GI protection Follow up with Orthopedics for continued management

## 2022-12-14 NOTE — Assessment & Plan Note (Signed)
Chronic Not on statin therapy.  Last lipids checked 2023 The 10-year ASCVD risk score (Arnett DK, et al., 2019) is: 13.9%  Check fasting lipids Consider discussion of statin therapy at next visit

## 2022-12-14 NOTE — Assessment & Plan Note (Signed)
Follows with [facility]

## 2022-12-14 NOTE — Assessment & Plan Note (Signed)
Symptoms started after gardening and bitten by insects Have been improving but continues to have itch  Start Triamcinolone cream BID x 5 days then as needed If no improvement follow up with PCP

## 2022-12-22 ENCOUNTER — Other Ambulatory Visit: Payer: Self-pay | Admitting: Physician Assistant

## 2022-12-22 DIAGNOSIS — M1990 Unspecified osteoarthritis, unspecified site: Secondary | ICD-10-CM

## 2022-12-23 ENCOUNTER — Encounter: Payer: Self-pay | Admitting: Family Medicine

## 2023-02-05 ENCOUNTER — Telehealth: Payer: Medicare HMO | Admitting: Physician Assistant

## 2023-02-05 ENCOUNTER — Encounter: Payer: Self-pay | Admitting: Family Medicine

## 2023-02-05 DIAGNOSIS — U071 COVID-19: Secondary | ICD-10-CM

## 2023-02-05 NOTE — Progress Notes (Signed)
   Thank you for the details you included in the comment boxes. Those details are very helpful in determining the best course of treatment for you and help [place] to provide the best care.Because your are COVID positive with high fever and risk factors for more complicated illness, we recommend that you convert this visit to a video visit in order for the provider to better assess what is going on.  The provider will be able to give you a more accurate diagnosis and treatment plan, including discussion and prescription of antiviral medications, if we can more freely discuss your symptoms and with the addition of a virtual examination.   If you convert to a video visit, we will bill your insurance (similar to an office visit) and you will not be charged for this e-Visit. You will be able to stay at home and speak with the first available [facility] advanced practice provider. The link to do a video visit is in the drop down Menu tab of your Welcome screen in MyChart.

## 2023-03-08 DIAGNOSIS — Z973 Presence of spectacles and contact lenses: Secondary | ICD-10-CM | POA: Diagnosis not present

## 2023-03-16 ENCOUNTER — Other Ambulatory Visit (INDEPENDENT_AMBULATORY_CARE_PROVIDER_SITE_OTHER): Payer: Medicare HMO

## 2023-03-16 DIAGNOSIS — E782 Mixed hyperlipidemia: Secondary | ICD-10-CM

## 2023-03-16 DIAGNOSIS — I10 Essential (primary) hypertension: Secondary | ICD-10-CM | POA: Diagnosis not present

## 2023-03-16 DIAGNOSIS — R7309 Other abnormal glucose: Secondary | ICD-10-CM | POA: Diagnosis not present

## 2023-03-16 DIAGNOSIS — E559 Vitamin D deficiency, unspecified: Secondary | ICD-10-CM | POA: Diagnosis not present

## 2023-03-16 DIAGNOSIS — M1990 Unspecified osteoarthritis, unspecified site: Secondary | ICD-10-CM

## 2023-03-16 LAB — CBC WITH DIFFERENTIAL/PLATELET
Basophils Absolute: 0 10*3/uL (ref 0.0–0.1)
Basophils Relative: 1 % (ref 0.0–3.0)
Eosinophils Absolute: 0.2 10*3/uL (ref 0.0–0.7)
Eosinophils Relative: 4.8 % (ref 0.0–5.0)
HCT: 35.1 % — ABNORMAL LOW (ref 36.0–46.0)
Hemoglobin: 11 g/dL — ABNORMAL LOW (ref 12.0–15.0)
Lymphocytes Relative: 25.1 % (ref 12.0–46.0)
Lymphs Abs: 1.2 10*3/uL (ref 0.7–4.0)
MCHC: 31.2 g/dL (ref 30.0–36.0)
MCV: 82.3 fL (ref 78.0–100.0)
Monocytes Absolute: 0.5 10*3/uL (ref 0.1–1.0)
Monocytes Relative: 10.6 % (ref 3.0–12.0)
Neutro Abs: 2.7 10*3/uL (ref 1.4–7.7)
Neutrophils Relative %: 58.5 % (ref 43.0–77.0)
Platelets: 307 10*3/uL (ref 150.0–400.0)
RBC: 4.27 Mil/uL (ref 3.87–5.11)
RDW: 17.3 % — ABNORMAL HIGH (ref 11.5–15.5)
WBC: 4.6 10*3/uL (ref 4.0–10.5)

## 2023-03-16 LAB — COMPREHENSIVE METABOLIC PANEL
ALT: 19 U/L (ref 0–35)
AST: 23 U/L (ref 0–37)
Albumin: 4.1 g/dL (ref 3.5–5.2)
Alkaline Phosphatase: 89 U/L (ref 39–117)
BUN: 22 mg/dL (ref 6–23)
CO2: 28 meq/L (ref 19–32)
Calcium: 9.7 mg/dL (ref 8.4–10.5)
Chloride: 104 meq/L (ref 96–112)
Creatinine, Ser: 0.84 mg/dL (ref 0.40–1.20)
GFR: 70.81 mL/min (ref >60.00)
Glucose, Bld: 105 mg/dL — ABNORMAL HIGH (ref 70–99)
Potassium: 4.4 meq/L (ref 3.5–5.1)
Sodium: 139 meq/L (ref 135–145)
Total Bilirubin: 0.4 mg/dL (ref 0.2–1.2)
Total Protein: 6.8 g/dL (ref 6.0–8.3)

## 2023-03-16 LAB — LIPID PANEL
Cholesterol: 220 mg/dL — ABNORMAL HIGH (ref 0–200)
HDL: 48.8 mg/dL (ref >39.00)
LDL Cholesterol: 135 mg/dL — ABNORMAL HIGH (ref 0–99)
NonHDL: 171.44
Total CHOL/HDL Ratio: 5
Triglycerides: 183 mg/dL — ABNORMAL HIGH (ref 0.0–149.0)
VLDL: 36.6 mg/dL (ref 0.0–40.0)

## 2023-03-16 LAB — VITAMIN D 25 HYDROXY (VIT D DEFICIENCY, FRACTURES): VITD: 38.1 ng/mL (ref 30.00–100.00)

## 2023-03-16 LAB — HEMOGLOBIN A1C: Hgb A1c MFr Bld: 6.2 % (ref 4.6–6.5)

## 2023-03-21 ENCOUNTER — Other Ambulatory Visit: Payer: Self-pay | Admitting: Family Medicine

## 2023-03-21 DIAGNOSIS — M1990 Unspecified osteoarthritis, unspecified site: Secondary | ICD-10-CM

## 2023-03-23 ENCOUNTER — Ambulatory Visit: Payer: Medicare HMO | Admitting: Family Medicine

## 2023-03-23 ENCOUNTER — Encounter: Payer: Self-pay | Admitting: Family Medicine

## 2023-03-23 VITALS — BP 120/68 | HR 62 | Temp 98.0°F | Resp 16 | Ht 66.0 in | Wt 175.5 lb

## 2023-03-23 DIAGNOSIS — M1712 Unilateral primary osteoarthritis, left knee: Secondary | ICD-10-CM

## 2023-03-23 DIAGNOSIS — D649 Anemia, unspecified: Secondary | ICD-10-CM | POA: Diagnosis not present

## 2023-03-23 DIAGNOSIS — E559 Vitamin D deficiency, unspecified: Secondary | ICD-10-CM

## 2023-03-23 DIAGNOSIS — U099 Post covid-19 condition, unspecified: Secondary | ICD-10-CM

## 2023-03-23 DIAGNOSIS — R7303 Prediabetes: Secondary | ICD-10-CM

## 2023-03-23 DIAGNOSIS — E782 Mixed hyperlipidemia: Secondary | ICD-10-CM | POA: Diagnosis not present

## 2023-03-23 LAB — IBC + FERRITIN
Ferritin: 7.1 ng/mL — ABNORMAL LOW (ref 10.0–291.0)
Iron: 58 ug/dL (ref 42–145)
Saturation Ratios: 10.9 % — ABNORMAL LOW (ref 20.0–50.0)
TIBC: 533.4 ug/dL — ABNORMAL HIGH (ref 250.0–450.0)
Transferrin: 381 mg/dL — ABNORMAL HIGH (ref 212.0–360.0)

## 2023-03-23 NOTE — Progress Notes (Unsigned)
SUBJECTIVE:   Chief Complaint  Patient presents with   Medical Management of Chronic Issues   HPI Presents for chronic care management follow up  Discussed the use of AI scribe software for clinical note transcription with the patient, who gave verbal consent to proceed.  History of Present Illness The patient, a 67 year old with a history of prediabetes and hyperlipidemia, presents for a four-month follow-up. They report a recent bout of COVID-19 in early September, which was characterized by a two-week period of extreme fatigue, fever, chills, and a persistent cough. While the majority of the symptoms have resolved, the patient continues to experience residual fatigue and a lack of energy, which they attribute to the aftermath of the infection.  The patient's recent labs indicate a prediabetic A1C, slightly elevated cholesterol and triglycerides, and a slightly low hemoglobin level. They deny any recent bleeding or use of aspirin. The patient is not currently on any medications and prefers to manage their conditions through lifestyle modifications. They report regular exercise and have started taking vitamin D supplements in the past week due to a slightly low level.  The patient also reports a recent colonoscopy, which revealed more polyps than previous screenings. They are scheduled for annual follow-ups due to this finding. They deny any recent gastrointestinal bleeding.    PERTINENT PMH / PSH: As above  OBJECTIVE:  BP 120/68   Pulse 62   Temp 98 F (36.7 C)   Resp 16   Ht 5\' 6"  (1.676 m)   Wt 175 lb 8 oz (79.6 kg)   SpO2 96%   BMI 28.33 kg/m    Physical Exam Vitals reviewed.  Constitutional:      General: She is not in acute distress.    Appearance: Normal appearance. She is normal weight. She is not ill-appearing, toxic-appearing or diaphoretic.  Eyes:     General:        Right eye: No discharge.        Left eye: No discharge.     Conjunctiva/sclera: Conjunctivae  normal.  Cardiovascular:     Rate and Rhythm: Normal rate and regular rhythm.     Heart sounds: Normal heart sounds.  Pulmonary:     Effort: Pulmonary effort is normal.     Breath sounds: Normal breath sounds.  Abdominal:     General: Bowel sounds are normal.  Musculoskeletal:        General: Normal range of motion.  Skin:    General: Skin is warm and dry.  Neurological:     General: No focal deficit present.     Mental Status: She is alert and oriented to person, place, and time. Mental status is at baseline.  Psychiatric:        Mood and Affect: Mood normal.        Behavior: Behavior normal.        Thought Content: Thought content normal.        Judgment: Judgment normal.        03/23/2023    9:12 AM 11/18/2022    9:50 AM 07/31/2022   10:32 AM 01/22/2022    8:43 AM 07/25/2021   10:01 AM  Depression screen PHQ 2/9  Decreased Interest 0 0 0 0 0  Down, Depressed, Hopeless 0 0 0 0 0  PHQ - 2 Score 0 0 0 0 0  Altered sleeping 1 0 0 0 0  Tired, decreased energy 1 1 0 1 0  Change in appetite 0 0 0 0  1  Feeling bad or failure about yourself  0 0 0 0 0  Trouble concentrating 0 0 0 0 0  Moving slowly or fidgety/restless 0 0 0 0 0  Suicidal thoughts 0 0 0 0 0  PHQ-9 Score 2 1 0 1 1  Difficult doing work/chores Not difficult at all Not difficult at all Not difficult at all Not difficult at all Not difficult at all      03/23/2023    9:12 AM 11/18/2022    9:50 AM  GAD 7 : Generalized Anxiety Score  Nervous, Anxious, on Edge 0 0  Control/stop worrying 0 0  Worry too much - different things 0 0  Trouble relaxing 0 0  Restless 0 0  Easily annoyed or irritable 0 0  Afraid - awful might happen 0 0  Total GAD 7 Score 0 0  Anxiety Difficulty Not difficult at all Not difficult at all    ASSESSMENT/PLAN:  Anemia, unspecified type Assessment & Plan: Slight decrease in hemoglobin. No known bleeding or iron deficiency. -Order ferritin level to assess for iron  deficiency. -Consider dietary modifications or iron supplementation based on ferritin results.  Orders: -     IBC + Ferritin -     Iron (Ferrous Sulfate); Take 325 mg by mouth every other day.  Dispense: 90 tablet; Refill: 3  Mixed hyperlipidemia Assessment & Plan: Slight increase in cholesterol and triglycerides. No current medications for cholesterol control. -Consider dietary modifications including low glycemic index foods and increased protein intake. -Consider statin therapy if cholesterol levels increase and patient is agreeable.   Vitamin D deficiency Assessment & Plan: Vitamin D level at lower end of normal range. -Continue Vitamin D supplementation as started by patient.   Post-COVID syndrome Assessment & Plan: Recent COVID-19 infection with ongoing fatigue. No other residual symptoms. -Continue supportive care and monitor for improvement.   Prediabetes Assessment & Plan: A1c in prediabetic range. No current medications for glucose control. -Monitor carbohydrate intake. -Consider medication if A1c increases and patient is agreeable.   Primary osteoarthritis of left knee Assessment & Plan: Refill Celebrex 100 mg daily    General Health Maintenance -Declined flu vaccine today, but plans to receive it at a later date.  PDMP reviewed  Return in about 6 months (around 09/21/2023) for PCP.  Dana Allan, MD

## 2023-03-23 NOTE — Patient Instructions (Signed)
It was a pleasure meeting you today. Thank you for allowing me to take part in your health care.  Our goals for today as we discussed include:  We will get some labs today.  If they are abnormal or we need to do something about them, I will call you.  If they are normal, I will send you a message on MyChart (if it is active) or a letter in the mail.  If you don't hear from [place] in 2 weeks, please call the office at the number below.   Increase iron rich foods in diet  Follow up in 6 months  This is a list of the screening recommended for you and due dates:  Health Maintenance  Topic Date Due   COVID-19 Vaccine (3 - 2023-24 season) 02/01/2023   Mammogram  03/19/2023   Flu Shot  08/31/2023*   DTaP/Tdap/Td vaccine (3 - Td or Tdap) 04/26/2023   Medicare Annual Wellness Visit  07/31/2023   Colon Cancer Screening  11/10/2023   DEXA scan (bone density measurement)  03/02/2024   Pneumonia Vaccine  Completed   Hepatitis C Screening  Completed   Zoster (Shingles) Vaccine  Completed   HPV Vaccine  Aged Out  *Topic was postponed. The date shown is not the original due date.      If you have any questions or concerns, please do not hesitate to call the office at [phone].  I look forward to our next visit and until then take care and stay safe.  Regards,   Dana Allan, MD   [facility]

## 2023-03-24 ENCOUNTER — Encounter: Payer: Self-pay | Admitting: Family Medicine

## 2023-03-24 DIAGNOSIS — R7303 Prediabetes: Secondary | ICD-10-CM | POA: Insufficient documentation

## 2023-03-24 DIAGNOSIS — U099 Post covid-19 condition, unspecified: Secondary | ICD-10-CM | POA: Insufficient documentation

## 2023-03-24 DIAGNOSIS — D649 Anemia, unspecified: Secondary | ICD-10-CM | POA: Insufficient documentation

## 2023-03-24 MED ORDER — IRON (FERROUS SULFATE) 325 (65 FE) MG PO TABS
325.0000 mg | ORAL_TABLET | ORAL | 3 refills | Status: DC
Start: 2023-03-24 — End: 2023-11-19

## 2023-03-24 NOTE — Assessment & Plan Note (Signed)
Slight decrease in hemoglobin. No known bleeding or iron deficiency. -Order ferritin level to assess for iron deficiency. -Consider dietary modifications or iron supplementation based on ferritin results.

## 2023-03-24 NOTE — Assessment & Plan Note (Signed)
Slight increase in cholesterol and triglycerides. No current medications for cholesterol control. -Consider dietary modifications including low glycemic index foods and increased protein intake. -Consider statin therapy if cholesterol levels increase and patient is agreeable.

## 2023-03-24 NOTE — Assessment & Plan Note (Signed)
Recent COVID-19 infection with ongoing fatigue. No other residual symptoms. -Continue supportive care and monitor for improvement.

## 2023-03-24 NOTE — Assessment & Plan Note (Signed)
Refill Celebrex 100 mg daily

## 2023-03-24 NOTE — Assessment & Plan Note (Signed)
A1c in prediabetic range. No current medications for glucose control. -Monitor carbohydrate intake. -Consider medication if A1c increases and patient is agreeable.

## 2023-03-24 NOTE — Assessment & Plan Note (Signed)
Vitamin D level at lower end of normal range. -Continue Vitamin D supplementation as started by patient.

## 2023-03-31 ENCOUNTER — Ambulatory Visit
Admission: RE | Admit: 2023-03-31 | Discharge: 2023-03-31 | Disposition: A | Discharge status: Home / Self Care | Payer: Medicare HMO | Source: Ambulatory Visit | Attending: Family Medicine | Admitting: Family Medicine

## 2023-03-31 ENCOUNTER — Encounter: Payer: Self-pay | Admitting: Family Medicine

## 2023-03-31 DIAGNOSIS — M85831 Other specified disorders of bone density and structure, right forearm: Secondary | ICD-10-CM | POA: Diagnosis not present

## 2023-03-31 DIAGNOSIS — Z1231 Encounter for screening mammogram for malignant neoplasm of breast: Secondary | ICD-10-CM | POA: Diagnosis not present

## 2023-03-31 DIAGNOSIS — Z780 Asymptomatic menopausal state: Secondary | ICD-10-CM | POA: Diagnosis not present

## 2023-03-31 DIAGNOSIS — I10 Essential (primary) hypertension: Secondary | ICD-10-CM

## 2023-03-31 DIAGNOSIS — M85832 Other specified disorders of bone density and structure, left forearm: Secondary | ICD-10-CM | POA: Diagnosis not present

## 2023-03-31 MED ORDER — LISINOPRIL-HYDROCHLOROTHIAZIDE 10-12.5 MG PO TABS: 1.0000 | ORAL_TABLET | Freq: Every day | ORAL | 1 refills | Status: DC

## 2023-04-06 ENCOUNTER — Encounter: Payer: Self-pay | Admitting: Family Medicine

## 2023-06-24 ENCOUNTER — Other Ambulatory Visit: Payer: Self-pay | Admitting: Family Medicine

## 2023-06-24 DIAGNOSIS — M1990 Unspecified osteoarthritis, unspecified site: Secondary | ICD-10-CM

## 2023-08-01 ENCOUNTER — Encounter: Payer: Self-pay | Admitting: Family Medicine

## 2023-08-03 ENCOUNTER — Ambulatory Visit: Payer: Self-pay | Admitting: Family Medicine

## 2023-08-03 NOTE — Telephone Encounter (Signed)
 Chief Complaint: vaginal bleeding/spotting Symptoms: spotting, wearing a Poise pad Frequency: Friday night Pertinent Negatives: Patient denies heavy bleeding, dizziness, weakness, fever, abdominal pain, back pain, urinary symptoms Disposition: [] ED /[] Urgent Care (no appt availability in office) / [x] Appointment(In office/virtual)/ []  [facility]/ [] Home Care/ [] Refused Recommended Disposition /[] [facility]/ []  Follow-up with PCP Additional Notes: Pt reports vaginal bleeding/spotting since Friday night. Pt has a hx of hysterectomy. Cannot recall her last menstrual period but states it was many years ago. Pt states she did yoga the day the bleeding started. Denies hematuria or dysuria. States the bleeding has lessened and today is more like spotting. No pain aside from back pain from lifting grandbabies. Pt states she was recently on a trip out of town and became constipated and then had a large bowel movement. No rectal bleeding. No fever. No N/V/D. No dizziness or weakness. Per protocol pt scheduled tomorrow at 2:40. Pt agreeable to that plan. RN advised pt she needs to call back if bleeding worsens or if she has pain or becomes weak or dizzy to which she verbalized understanding.    Copied from CRM [id number]. Topic: Clinical - Red Word Triage >> Aug 03, 2023  9:10 AM [place] A wrote: [facility] that prompted transfer to Nurse Triage: Patient started bleeding Friday evening, pt said that she has had hysterctomy maybe in 2018 , no pain Reason for Disposition  Postmenopausal vaginal bleeding  Answer Assessment - Initial Assessment Questions 1. AMOUNT: "Describe the bleeding that you are having." "How much bleeding is there?"    - SPOTTING: spotting, or pinkish / brownish mucous discharge; does not fill panty liner or pad    - MILD:  less than 1 pad / hour; less than patient's usual menstrual bleeding   - MODERATE: 1-2 pads / hour; 1 menstrual cup every 6 hours; small-medium  blood clots (e.g., pea, grape, small coin)   - SEVERE: soaking 2 or more pads/hour for 2 or more hours; 1 menstrual cup every 2 hours; bleeding not contained by pads or continuous red blood from vagina; large blood clots (e.g., golf ball, large coin)      "Not even hardly spotting today, I think it's stopped, but it was spotting yesterday. I am wearing a Poise pad." 2. ONSET: "When did the bleeding begin?" "Is it continuing now?"     Friday night 3. MENOPAUSE: "When was your last menstrual period?"      "Many years ago" - can't remember 4. ABDOMEN PAIN: "Do you have any pain?" "How bad is the pain?"  (e.g., Scale 1-10; mild, moderate, or severe)   - MILD (1-3): doesn't interfere with normal activities, abdomen soft and not tender to touch    - MODERATE (4-7): interferes with normal activities or awakens from sleep, abdomen tender to touch    - SEVERE (8-10): excruciating pain, doubled over, unable to do any normal activities      None 5. BLOOD THINNERS: "Do you take any blood thinners?" (e.g., Coumadin/warfarin, Pradaxa/dabigatran, aspirin)     No 6. HORMONE MEDICINES: "Are you taking any hormone medicines, prescription or OTC?" (e.g., birth control pills, estrogen)     No 7. CAUSE: "What do you think is causing the bleeding?" (e.g., recent gyn surgery, recent gyn procedure; known bleeding disorder, uterine cancer)       Pt states she was doing yoga the morning the bleeding started  8. HEMODYNAMIC STATUS: "Are you weak or feeling lightheaded?" If Yes, ask: "Can you stand  and walk normally?"       None 9. OTHER SYMPTOMS: "What other symptoms are you having with the bleeding?" (e.g., back pain, burning with urination, fever)     "Been lifting grandbabies so my back is sore, but it's nothing to do with that", "everything is fine", "when I travel I get constipated, we have been at a beach house, finally I had some relief, it was astronomical (large BM)", bleeding is not "coming from  behind"  Protocols used: Vaginal Bleeding - Postmenopausal-A-AH

## 2023-08-04 ENCOUNTER — Ambulatory Visit: Admitting: Family Medicine

## 2023-08-05 ENCOUNTER — Encounter: Payer: Self-pay | Admitting: Family

## 2023-08-05 ENCOUNTER — Ambulatory Visit: Admitting: Family

## 2023-08-05 VITALS — BP 130/76 | HR 96 | Temp 97.6°F | Ht 66.0 in | Wt 173.0 lb

## 2023-08-05 DIAGNOSIS — N939 Abnormal uterine and vaginal bleeding, unspecified: Secondary | ICD-10-CM | POA: Diagnosis not present

## 2023-08-05 NOTE — Progress Notes (Unsigned)
 Assessment & Plan:  Vaginal bleeding Assessment & Plan: Resolved at this time.  History of hysterectomy.  Patient very politely declines pelvic exam today.  After discussion, most likely postcoital vaginal bleeding secondary to vaginal atrophy. Brief discussion of vaginal estrogen.  Advised if symptom recurred pelvic exam would be necessary.  In the absence of urinary symptoms, we deferred obtaining urinalysis.  Orders: -     Urine Culture -     Urinalysis, Routine w reflex microscopic     Return precautions given.   Risks, benefits, and alternatives of the medications and treatment plan prescribed today were discussed, and patient expressed understanding.   Education regarding symptom management and diagnosis given to patient on AVS either electronically or printed.  Return if symptoms worsen or fail to improve.  Rennie Plowman, FNP  Subjective:    Patient ID: Kelly Nguyen, female    DOB: July 30, 1955, 68 y.o.   MRN: [id number]  CC: Kelly Nguyen is a 68 y.o. female who presents today for an acute visit.    HPI: Complains of vaginal bleeding 6 days ago for 3-4 days, since completely resolved. Symptom after intercourse which is infrequent.   Marron color in her panty liner.  Very light and size of 'two quarters' for 2 days.   She also had constipation and very diffcult to pass stool the day prior.    Denies pelvic pain, hematuria, vaginal lesion, abdominal pain.   H/o HTN   History of hysterectomy, cystocele repair H/o diverticulosis   Allergies: Acyclovir and related, Meloxicam, and Tramadol Current Outpatient Medications on File Prior to Visit  Medication Sig Dispense Refill   celecoxib (CELEBREX) 100 MG capsule TAKE 1 CAPSULE BY MOUTH DAILY 90 capsule 0   colestipol (COLESTID) 1 g tablet Take 1 tablet (1 g total) by mouth daily. 30 tablet 3   fluticasone (FLONASE) 50 MCG/ACT nasal spray Place 1 spray into both nostrils daily.     Iron, Ferrous Sulfate, 325 (65 Fe) MG  TABS Take 325 mg by mouth every other day. 90 tablet 3   lactobacillus acidophilus (BACID) TABS tablet Take 1 tablet by mouth daily.     lisinopril-hydrochlorothiazide (ZESTORETIC) 10-12.5 MG tablet Take 1 tablet by mouth daily. 90 tablet 1   Multiple Vitamins-Minerals (MULTIVITAMIN WITH MINERALS) tablet Take 1 tablet by mouth daily.     No current facility-administered medications on file prior to visit.    Review of Systems  Constitutional:  Negative for chills and fever.  Respiratory:  Negative for cough.   Cardiovascular:  Negative for chest pain and palpitations.  Gastrointestinal:  Negative for constipation (resolved), nausea and vomiting.  Genitourinary:  Negative for vaginal bleeding (resolved).      Objective:    BP 130/76   Pulse 96   Temp 97.6 F (36.4 C) (Oral)   Ht 5\' 6"  (1.676 m)   Wt 173 lb (78.5 kg)   SpO2 97%   BMI 27.92 kg/m   BP Readings from Last 3 Encounters:  08/05/23 130/76  03/23/23 120/68  11/18/22 138/88   Wt Readings from Last 3 Encounters:  08/05/23 173 lb (78.5 kg)  03/23/23 175 lb 8 oz (79.6 kg)  11/18/22 175 lb 3.2 oz (79.5 kg)    Physical Exam Vitals reviewed.  Constitutional:      Appearance: She is well-developed.  Eyes:     Conjunctiva/sclera: Conjunctivae normal.  Cardiovascular:     Rate and Rhythm: Normal rate and regular rhythm.     Pulses: Normal  pulses.     Heart sounds: Normal heart sounds.  Pulmonary:     Effort: Pulmonary effort is normal.     Breath sounds: Normal breath sounds. No wheezing, rhonchi or rales.  Skin:    General: Skin is warm and dry.  Neurological:     Mental Status: She is alert.  Psychiatric:        Speech: Speech normal.        Behavior: Behavior normal.        Thought Content: Thought content normal.

## 2023-08-06 DIAGNOSIS — N939 Abnormal uterine and vaginal bleeding, unspecified: Secondary | ICD-10-CM | POA: Insufficient documentation

## 2023-08-06 NOTE — Assessment & Plan Note (Addendum)
 Resolved at this time.  History of hysterectomy.  Patient very politely declines pelvic exam today.  After discussion, most likely postcoital vaginal bleeding secondary to vaginal atrophy. Brief discussion of vaginal estrogen.  Advised if symptom recurred pelvic exam would be necessary.  In the absence of urinary symptoms, we deferred obtaining urinalysis.

## 2023-08-07 DIAGNOSIS — M7582 Other shoulder lesions, left shoulder: Secondary | ICD-10-CM | POA: Diagnosis not present

## 2023-08-07 DIAGNOSIS — M75122 Complete rotator cuff tear or rupture of left shoulder, not specified as traumatic: Secondary | ICD-10-CM | POA: Diagnosis not present

## 2023-09-01 ENCOUNTER — Ambulatory Visit (INDEPENDENT_AMBULATORY_CARE_PROVIDER_SITE_OTHER): Payer: Medicare HMO | Admitting: *Deleted

## 2023-09-01 VITALS — Ht 66.0 in | Wt 168.0 lb

## 2023-09-01 DIAGNOSIS — Z0000 Encounter for general adult medical examination without abnormal findings: Secondary | ICD-10-CM

## 2023-09-01 NOTE — Patient Instructions (Signed)
 Kelly Nguyen , Thank you for taking time to come for your Medicare Wellness Visit. I appreciate your ongoing commitment to your health goals. Please review the following plan we discussed and let me know if I can assist you in the future.   Referrals/Orders/Follow-Ups/Clinician Recommendations: Your flu shot has been updated. Remember to update your tetanus vaccine.  This is a list of the screening recommended for you and due dates:  Health Maintenance  Topic Date Due   COVID-19 Vaccine (3 - 2024-25 season) 02/01/2023   DTaP/Tdap/Td vaccine (3 - Td or Tdap) 04/26/2023   Colon Cancer Screening  11/10/2023   Flu Shot  01/01/2024   Mammogram  03/30/2024   Medicare Annual Wellness Visit  08/31/2024   DEXA scan (bone density measurement)  03/30/2028   Pneumonia Vaccine  Completed   Hepatitis C Screening  Completed   Zoster (Shingles) Vaccine  Completed   HPV Vaccine  Aged Out    Advanced directives: (In Chart) A copy of your advanced directives are scanned into your chart should your provider ever need it.  Next Medicare Annual Wellness Visit scheduled for next year: Yes 09/06/24 @ 8:50

## 2023-09-01 NOTE — Progress Notes (Signed)
 Subjective:   Kelly Nguyen is a 68 y.o. who presents for a Medicare Wellness preventive visit.  Visit Complete: Virtual I connected with  Benard Rink on 09/01/23 by a audio enabled telemedicine application and verified that I am speaking with the correct person using two identifiers.  Patient Location: Home  Provider Location: Office/Clinic  I discussed the limitations of evaluation and management by telemedicine. The patient expressed understanding and agreed to proceed.  Vital Signs: Because this visit was a virtual/telehealth visit, some criteria may be missing or patient reported. Any vitals not documented were not able to be obtained and vitals that have been documented are patient reported.  VideoDeclined- This patient declined Librarian, academic. Therefore the visit was completed with audio only.  Persons Participating in Visit: Patient.  AWV Questionnaire: Yes: Patient Medicare AWV questionnaire was completed by the patient on 09/01/23; I have confirmed that all information answered by patient is correct and no changes since this date.  Cardiac Risk Factors include: advanced age (>35men, >67 women);dyslipidemia;hypertension     Objective:    Today's Vitals   09/01/23 1349  Weight: 168 lb (76.2 kg)  Height: 5\' 6"  (1.676 m)   Body mass index is 27.12 kg/m.     09/01/2023    1:58 PM 11/10/2022    1:08 PM 04/07/2022    4:14 PM 04/07/2022    9:39 AM 03/25/2022    1:16 PM 09/26/2019   10:45 AM 09/01/2019   11:53 AM  Advanced Directives  Does Patient Have a Medical Advance Directive? Yes Yes Yes Yes Yes Yes No  Type of Estate agent of [place];Living will Living will Healthcare Power of [employer] Power of [place];Living will Healthcare Power of [place];Living will Healthcare Power of [place];Living will   Does patient want to make changes to medical advance directive? No - Patient declined  No - Patient declined No -  Patient declined     Copy of Healthcare Power of Attorney in Chart? Yes - validated most recent copy scanned in chart (See row information)  Yes - validated most recent copy scanned in chart (See row information) Yes - validated most recent copy scanned in chart (See row information)  No - copy requested   Would patient like information on creating a medical advance directive?       No - Patient declined    Current Medications (verified) Outpatient Encounter Medications as of 09/01/2023  Medication Sig   celecoxib (CELEBREX) 100 MG capsule TAKE 1 CAPSULE BY MOUTH DAILY   colestipol (COLESTID) 1 g tablet Take 1 tablet (1 g total) by mouth daily.   fluticasone (FLONASE) 50 MCG/ACT nasal spray Place 1 spray into both nostrils daily.   lactobacillus acidophilus (BACID) TABS tablet Take 1 tablet by mouth daily.   lisinopril-hydrochlorothiazide (ZESTORETIC) 10-12.5 MG tablet Take 1 tablet by mouth daily.   Multiple Vitamins-Minerals (MULTIVITAMIN WITH MINERALS) tablet Take 1 tablet by mouth daily.   Iron, Ferrous Sulfate, 325 (65 Fe) MG TABS Take 325 mg by mouth every other day. (Patient not taking: Reported on 09/01/2023)   No facility-administered encounter medications on file as of 09/01/2023.    Allergies (verified) Acyclovir and related, Meloxicam, and Tramadol   History: Past Medical History:  Diagnosis Date   Diaphragmatic hernia    Diverticulosis    Dysrhythmia    GERD (gastroesophageal reflux disease)    Heart murmur    Heart valve problem    History of hiatal hernia  Hyperlipidemia    Hypertension    Prolapse of uterus 2012   Stress incontinence 2012   Past Surgical History:  Procedure Laterality Date   ABDOMINAL HYSTERECTOMY     CARDIAC CATHETERIZATION     CHOLECYSTECTOMY     COLONOSCOPY WITH PROPOFOL N/A 02/27/2017   Procedure: COLONOSCOPY WITH PROPOFOL;  Surgeon: Wyline Mood, MD;  Location: [facility] [place] ENDOSCOPY;  Service: Gastroenterology;  Laterality: N/A;   COLONOSCOPY WITH  PROPOFOL N/A 11/10/2022   Procedure: COLONOSCOPY WITH PROPOFOL;  Surgeon: Jaynie Collins, DO;  Location: [facility] ENDOSCOPY;  Service: Gastroenterology;  Laterality: N/A;   CYSTOCELE REPAIR N/A 04/10/2015   Procedure: ANTERIOR REPAIR (CYSTOCELE);  Surgeon: Nadara Mustard, MD;  Location: [facility] ORS;  Service: Gynecology;  Laterality: N/A;   HALLUX VALGUS CORRECTION Right 2004   HAMMER TOE SURGERY Right    JOINT REPLACEMENT     KNEE ARTHROPLASTY Right 05/27/2017   Procedure: COMPUTER ASSISTED TOTAL KNEE ARTHROPLASTY;  Surgeon: Donato Heinz, MD;  Location: [facility] ORS;  Service: Orthopedics;  Laterality: Right;   KNEE ARTHROPLASTY Left 04/07/2022   Procedure: COMPUTER ASSISTED TOTAL KNEE ARTHROPLASTY;  Surgeon: Donato Heinz, MD;  Location: [facility] ORS;  Service: Orthopedics;  Laterality: Left;   ROTATOR CUFF REPAIR Right 2004   SALPINGOOPHORECTOMY Bilateral 04/10/2015   Procedure: SALPINGO OOPHORECTOMY;  Surgeon: Nadara Mustard, MD;  Location: [facility] ORS;  Service: Gynecology;  Laterality: Bilateral;  vaginal   VAGINAL HYSTERECTOMY  04/10/2015   Procedure: HYSTERECTOMY VAGINAL, ;  Surgeon: Nadara Mustard, MD;  Location: [facility] ORS;  Service: Gynecology;;   Family History  Problem Relation Age of Onset   Healthy Mother    Mental illness Mother    Schizophrenia Mother    Heart disease Father    CAD Father    Depression Sister    Migraines Sister    Anxiety disorder Sister    Hypertension Sister    Schizophrenia Sister    Diabetes Other    Breast cancer Paternal Grandmother 19   Social History   Socioeconomic History   Marital status: Married    Spouse name: Jomarie Longs   Number of children: 1   Years of education: some college   Highest education level: Some college, no degree  Occupational History   Occupation: Retired    Comment: from [employer]  Tobacco Use   Smoking status: Never   Smokeless tobacco: Never  Vaping Use   Vaping status: Never Used  Substance and Sexual Activity    Alcohol use: Yes    Alcohol/week: 7.0 standard drinks of alcohol    Types: 7 Glasses of wine per week    Comment: a couple galsses a couple nights a week   Drug use: No   Sexual activity: Yes    Birth control/protection: Surgical  Other Topics Concern   Not on file  Social History Narrative   Not on file   Social Drivers of Health   Financial Resource Strain: Low Risk  (09/01/2023)   Overall Financial Resource Strain (CARDIA)    Difficulty of Paying Living Expenses: Not hard at all  Food Insecurity: No Food Insecurity (09/01/2023)   Hunger Vital Sign    Worried About Running Out of Food in the Last Year: Never true    Ran Out of Food in the Last Year: Never true  Transportation Needs: No Transportation Needs (09/01/2023)   PRAPARE - Administrator, Civil Service (Medical): No    Lack of Transportation (Non-Medical): No  Physical Activity: Sufficiently Active (09/01/2023)   Exercise Vital Sign    Days of Exercise per Week: 4 days    Minutes of Exercise per Session: 60 min  Stress: No Stress Concern Present (09/01/2023)   [employer] of Occupational Health - Occupational Stress Questionnaire    Feeling of Stress : Not at all  Social Connections: Unknown (09/01/2023)   Social Connection and Isolation Panel [NHANES]    Frequency of Communication with Friends and Family: Three times a week    Frequency of Social Gatherings with Friends and Family: Once a week    Attends Religious Services: Patient declined    Database administrator or Organizations: Yes    Attends Engineer, structural: More than 4 times per year    Marital Status: Married    Tobacco Counseling Counseling given: Not Answered    Clinical Intake:  Pre-visit preparation completed: Yes  Pain : No/denies pain     BMI - recorded: 27.12 Nutritional Status: BMI 25 -29 Overweight Nutritional Risks: None Diabetes: No  Lab Results  Component Value Date   HGBA1C 6.2 03/16/2023     How  often do you need to have someone help you when you read instructions, pamphlets, or other written materials from your doctor or pharmacy?: 1 - Never  Interpreter Needed?: No  Information entered by :: R. Maddilyn Campus LPN   Activities of Daily Living     09/01/2023    7:21 AM  In your present state of health, do you have any difficulty performing the following activities:  Hearing? 0  Vision? 0  Difficulty concentrating or making decisions? 0  Walking or climbing stairs? 0  Dressing or bathing? 0  Doing errands, shopping? 0  Preparing Food and eating ? N  Using the Toilet? N  In the past six months, have you accidently leaked urine? N  Do you have problems with loss of bowel control? N  Managing your Medications? N  Managing your Finances? N  Housekeeping or managing your Housekeeping? N    Patient Care Team: Dana Allan, MD as PCP - General (Family Medicine) Ernest Pine, Illene Labrador, MD (Orthopedic Surgery) Eli Phillips, OD (Optometry) Jaynie Collins, DO as Consulting Physician (Gastroenterology)  Indicate any recent Medical Services you may have received from other than [facility] providers in the past year (date may be approximate).     Assessment:   This is a routine wellness examination for Kennedy.  Hearing/Vision screen Hearing Screening - Comments:: No issues Vision Screening - Comments:: contacts   Goals Addressed             This Visit's Progress    Patient Stated       Wants to lose more weight and have more energy       Depression Screen     08/05/2023   12:35 PM 03/23/2023    9:12 AM 11/18/2022    9:50 AM 07/31/2022   10:32 AM 01/22/2022    8:43 AM 07/25/2021   10:01 AM 09/26/2019   10:40 AM  PHQ 2/9 Scores  PHQ - 2 Score 0 0 0 0 0 0 0  PHQ- 9 Score  2 1 0 1 1     Fall Risk     09/01/2023    7:21 AM 08/05/2023   12:35 PM 03/23/2023    9:12 AM 11/18/2022    9:50 AM 07/31/2022   10:25 AM  Fall Risk   Falls in the past year? 0 0 0  0   Number falls in  past yr: 0 0 0 0 0  Injury with Fall? 0 0 0 0 0  Risk for fall due to : No Fall Risks No Fall Risks No Fall Risks No Fall Risks   Follow up Falls prevention discussed;Falls evaluation completed Falls evaluation completed Falls evaluation completed Falls evaluation completed     MEDICARE RISK AT HOME:  Medicare Risk at Home Any stairs in or around the home?: (Patient-Rptd) Yes If so, are there any without handrails?: (Patient-Rptd) No Home free of loose throw rugs in walkways, pet beds, electrical cords, etc?: (Patient-Rptd) Yes Adequate lighting in your home to reduce risk of falls?: (Patient-Rptd) Yes Life alert?: (Patient-Rptd) No Use of a cane, walker or w/c?: (Patient-Rptd) No Grab bars in the bathroom?: (Patient-Rptd) Yes Shower chair or bench in shower?: (Patient-Rptd) Yes Elevated toilet seat or a handicapped toilet?: (Patient-Rptd) No  TIMED UP AND GO:  Was the test performed?  No  Cognitive Function: 6CIT completed        09/01/2023    1:58 PM 07/25/2021   10:05 AM 08/11/2018   11:29 AM  6CIT Screen  What Year? 0 points 0 points 0 points  What month? 0 points 0 points 0 points  What time? 0 points 0 points 0 points  Count back from 20 0 points 0 points 0 points  Months in reverse 0 points 0 points 0 points  Repeat phrase 0 points 4 points 0 points  Total Score 0 points 4 points 0 points    Immunizations Immunization History  Administered Date(s) Administered   Fluad Quad(high Dose 65+) 04/08/2022   Influenza-Unspecified 04/18/2019, 03/06/2020   PFIZER(Purple Top)SARS-COV-2 Vaccination 06/23/2019, 07/15/2019   Pneumococcal Conjugate-13 09/26/2019   Pneumococcal Polysaccharide-23 08/11/2018   Td 09/19/2004   Tdap 04/25/2013   Zoster Recombinant(Shingrix) 11/08/2021, 04/25/2022    Screening Tests Health Maintenance  Topic Date Due   COVID-19 Vaccine (3 - 2024-25 season) 02/01/2023   DTaP/Tdap/Td (3 - Td or Tdap) 04/26/2023   Colonoscopy  11/10/2023    INFLUENZA VACCINE  01/01/2024   MAMMOGRAM  03/30/2024   Medicare Annual Wellness (AWV)  08/31/2024   DEXA SCAN  03/30/2028   Pneumonia Vaccine 21+ Years old  Completed   Hepatitis C Screening  Completed   Zoster Vaccines- Shingrix  Completed   HPV VACCINES  Aged Out    Health Maintenance  Health Maintenance Due  Topic Date Due   COVID-19 Vaccine (3 - 2024-25 season) 02/01/2023   DTaP/Tdap/Td (3 - Td or Tdap) 04/26/2023   Colonoscopy  11/10/2023   Health Maintenance Items Addressed: Discussed the need to update her tetanus vaccine. Patient declines covid vaccine.  Additional Screening:  Vision Screening: Recommended annual ophthalmology exams for early detection of glaucoma and other disorders of the eye. Up to date [facility]  Dental Screening: Recommended annual dental exams for proper oral hygiene  Community Resource Referral / Chronic Care Management: CRR required this visit?  No   CCM required this visit?  No     Plan:     I have personally reviewed and noted the following in the patient's chart:   Medical and social history Use of alcohol, tobacco or illicit drugs  Current medications and supplements including opioid prescriptions. Patient is not currently taking opioid prescriptions. Functional ability and status Nutritional status Physical activity Advanced directives List of other physicians Hospitalizations, surgeries, and ER visits in previous 12 months Vitals Screenings to include cognitive, depression, and falls  Referrals and appointments  In addition, I have reviewed and discussed with patient certain preventive protocols, quality metrics, and best practice recommendations. A written personalized care plan for preventive services as well as general preventive health recommendations were provided to patient.     Sydell Axon, LPN   07/03/3084   After Visit Summary: (MyChart) Due to this being a telephonic visit, the after visit summary with  patients personalized plan was offered to patient via MyChart   Notes: Please refer to Routing Comments.

## 2023-09-04 NOTE — Progress Notes (Cosign Needed Addendum)
 Subjective:   Kelly Nguyen is a 68 y.o. who presents for a Medicare Wellness preventive visit.  Visit Complete: Virtual I connected with  Benard Rink on 09/01/23 by a audio enabled telemedicine application and verified that I am speaking with the correct person using two identifiers.  Patient Location: Home  Provider Location: Office/Clinic  I discussed the limitations of evaluation and management by telemedicine. The patient expressed understanding and agreed to proceed.  Vital Signs: Because this visit was a virtual/telehealth visit, some criteria may be missing or patient reported. Any vitals not documented were not able to be obtained and vitals that have been documented are patient reported.  VideoDeclined- This patient declined Librarian, academic. Therefore the visit was completed with audio only.  Persons Participating in Visit: Patient.  AWV Questionnaire: Yes: Patient Medicare AWV questionnaire was completed by the patient on 09/01/23; I have confirmed that all information answered by patient is correct and no changes since this date.  Cardiac Risk Factors include: advanced age (>33men, >61 women);dyslipidemia;hypertension     Objective:    Today's Vitals   09/01/23 1349  Weight: 168 lb (76.2 kg)  Height: 5\' 6"  (1.676 m)   Body mass index is 27.12 kg/m.     09/01/2023    1:58 PM 11/10/2022    1:08 PM 04/07/2022    4:14 PM 04/07/2022    9:39 AM 03/25/2022    1:16 PM 09/26/2019   10:45 AM 09/01/2019   11:53 AM  Advanced Directives  Does Patient Have a Medical Advance Directive? Yes Yes Yes Yes Yes Yes No  Type of Estate agent of [place];Living will Living will Healthcare Power of [employer] Power of [place];Living will Healthcare Power of [place];Living will Healthcare Power of [place];Living will   Does patient want to make changes to medical advance directive? No - Patient declined  No - Patient declined No -  Patient declined     Copy of Healthcare Power of Attorney in Chart? Yes - validated most recent copy scanned in chart (See row information)  Yes - validated most recent copy scanned in chart (See row information) Yes - validated most recent copy scanned in chart (See row information)  No - copy requested   Would patient like information on creating a medical advance directive?       No - Patient declined    Current Medications (verified) Outpatient Encounter Medications as of 09/01/2023  Medication Sig   celecoxib (CELEBREX) 100 MG capsule TAKE 1 CAPSULE BY MOUTH DAILY   colestipol (COLESTID) 1 g tablet Take 1 tablet (1 g total) by mouth daily.   fluticasone (FLONASE) 50 MCG/ACT nasal spray Place 1 spray into both nostrils daily.   lactobacillus acidophilus (BACID) TABS tablet Take 1 tablet by mouth daily.   lisinopril-hydrochlorothiazide (ZESTORETIC) 10-12.5 MG tablet Take 1 tablet by mouth daily.   Multiple Vitamins-Minerals (MULTIVITAMIN WITH MINERALS) tablet Take 1 tablet by mouth daily.   Iron, Ferrous Sulfate, 325 (65 Fe) MG TABS Take 325 mg by mouth every other day. (Patient not taking: Reported on 09/01/2023)   No facility-administered encounter medications on file as of 09/01/2023.    Allergies (verified) Acyclovir and related, Meloxicam, and Tramadol   History: Past Medical History:  Diagnosis Date   Diaphragmatic hernia    Diverticulosis    Dysrhythmia    GERD (gastroesophageal reflux disease)    Heart murmur    Heart valve problem    History of hiatal hernia  Hyperlipidemia    Hypertension    Prolapse of uterus 2012   Stress incontinence 2012   Past Surgical History:  Procedure Laterality Date   ABDOMINAL HYSTERECTOMY     CARDIAC CATHETERIZATION     CHOLECYSTECTOMY     COLONOSCOPY WITH PROPOFOL N/A 02/27/2017   Procedure: COLONOSCOPY WITH PROPOFOL;  Surgeon: Wyline Mood, MD;  Location: [facility] ENDOSCOPY;  Service: Gastroenterology;  Laterality: N/A;   COLONOSCOPY WITH  PROPOFOL N/A 11/10/2022   Procedure: COLONOSCOPY WITH PROPOFOL;  Surgeon: Jaynie Collins, DO;  Location: [facility] ENDOSCOPY;  Service: Gastroenterology;  Laterality: N/A;   CYSTOCELE REPAIR N/A 04/10/2015   Procedure: ANTERIOR REPAIR (CYSTOCELE);  Surgeon: Nadara Mustard, MD;  Location: [facility] ORS;  Service: Gynecology;  Laterality: N/A;   HALLUX VALGUS CORRECTION Right 2004   HAMMER TOE SURGERY Right    JOINT REPLACEMENT     KNEE ARTHROPLASTY Right 05/27/2017   Procedure: COMPUTER ASSISTED TOTAL KNEE ARTHROPLASTY;  Surgeon: Donato Heinz, MD;  Location: [facility] ORS;  Service: Orthopedics;  Laterality: Right;   KNEE ARTHROPLASTY Left 04/07/2022   Procedure: COMPUTER ASSISTED TOTAL KNEE ARTHROPLASTY;  Surgeon: Donato Heinz, MD;  Location: [facility] ORS;  Service: Orthopedics;  Laterality: Left;   ROTATOR CUFF REPAIR Right 2004   SALPINGOOPHORECTOMY Bilateral 04/10/2015   Procedure: SALPINGO OOPHORECTOMY;  Surgeon: Nadara Mustard, MD;  Location: [facility] ORS;  Service: Gynecology;  Laterality: Bilateral;  vaginal   VAGINAL HYSTERECTOMY  04/10/2015   Procedure: HYSTERECTOMY VAGINAL, ;  Surgeon: Nadara Mustard, MD;  Location: [facility] ORS;  Service: Gynecology;;   Family History  Problem Relation Age of Onset   Healthy Mother    Mental illness Mother    Schizophrenia Mother    Heart disease Father    CAD Father    Depression Sister    Migraines Sister    Anxiety disorder Sister    Hypertension Sister    Schizophrenia Sister    Diabetes Other    Breast cancer Paternal Grandmother 27   Social History   Socioeconomic History   Marital status: Married    Spouse name: Jomarie Longs   Number of children: 1   Years of education: some college   Highest education level: Some college, no degree  Occupational History   Occupation: Retired    Comment: from [employer]  Tobacco Use   Smoking status: Never   Smokeless tobacco: Never  Vaping Use   Vaping status: Never Used  Substance and Sexual Activity    Alcohol use: Yes    Alcohol/week: 7.0 standard drinks of alcohol    Types: 7 Glasses of wine per week    Comment: a couple galsses a couple nights a week   Drug use: No   Sexual activity: Yes    Birth control/protection: Surgical  Other Topics Concern   Not on file  Social History Narrative   Not on file   Social Drivers of Health   Financial Resource Strain: Low Risk  (09/01/2023)   Overall Financial Resource Strain (CARDIA)    Difficulty of Paying Living Expenses: Not hard at all  Food Insecurity: No Food Insecurity (09/01/2023)   Hunger Vital Sign    Worried About Running Out of Food in the Last Year: Never true    Ran Out of Food in the Last Year: Never true  Transportation Needs: No Transportation Needs (09/01/2023)   PRAPARE - Administrator, Civil Service (Medical): No    Lack of Transportation (Non-Medical): No  Physical Activity: Sufficiently Active (09/01/2023)   Exercise Vital Sign    Days of Exercise per Week: 4 days    Minutes of Exercise per Session: 60 min  Stress: No Stress Concern Present (09/01/2023)   [employer] of Occupational Health - Occupational Stress Questionnaire    Feeling of Stress : Not at all  Social Connections: Unknown (09/01/2023)   Social Connection and Isolation Panel [NHANES]    Frequency of Communication with Friends and Family: Three times a week    Frequency of Social Gatherings with Friends and Family: Once a week    Attends Religious Services: Patient declined    Database administrator or Organizations: Yes    Attends Engineer, structural: More than 4 times per year    Marital Status: Married    Tobacco Counseling Counseling given: Not Answered    Clinical Intake:  Pre-visit preparation completed: Yes  Pain : No/denies pain     BMI - recorded: 27.12 Nutritional Status: BMI 25 -29 Overweight Nutritional Risks: None Diabetes: No  Lab Results  Component Value Date   HGBA1C 6.2 03/16/2023     How  often do you need to have someone help you when you read instructions, pamphlets, or other written materials from your doctor or pharmacy?: 1 - Never  Interpreter Needed?: No  Information entered by :: R. Shasha Buchbinder LPN   Activities of Daily Living     09/01/2023    7:21 AM  In your present state of health, do you have any difficulty performing the following activities:  Hearing? 0  Vision? 0  Difficulty concentrating or making decisions? 0  Walking or climbing stairs? 0  Dressing or bathing? 0  Doing errands, shopping? 0  Preparing Food and eating ? N  Using the Toilet? N  In the past six months, have you accidently leaked urine? N  Do you have problems with loss of bowel control? N  Managing your Medications? N  Managing your Finances? N  Housekeeping or managing your Housekeeping? N    Patient Care Team: Dana Allan, MD as PCP - General (Family Medicine) Ernest Pine, Illene Labrador, MD (Orthopedic Surgery) Eli Phillips, OD (Optometry) Jaynie Collins, DO as Consulting Physician (Gastroenterology)  Indicate any recent Medical Services you may have received from other than [facility] providers in the past year (date may be approximate).     Assessment:   This is a routine wellness examination for Libbi.  Hearing/Vision screen Hearing Screening - Comments:: No issues Vision Screening - Comments:: contacts   Goals Addressed             This Visit's Progress    Patient Stated       Wants to lose more weight and have more energy       Depression Screen     09/01/2023   11:37 AM 09/01/2023   11:36 AM 08/05/2023   12:35 PM 03/23/2023    9:12 AM 11/18/2022    9:50 AM 07/31/2022   10:32 AM 01/22/2022    8:43 AM  PHQ 2/9 Scores  PHQ - 2 Score 0 0 0 0 0 0 0  PHQ- 9 Score 0 0  2 1 0 1    Fall Risk     09/01/2023    7:21 AM 08/05/2023   12:35 PM 03/23/2023    9:12 AM 11/18/2022    9:50 AM 07/31/2022   10:25 AM  Fall Risk   Falls in the past year? 0 0 0  0   Number falls in  past yr: 0 0 0 0 0  Injury with Fall? 0 0 0 0 0  Risk for fall due to : No Fall Risks No Fall Risks No Fall Risks No Fall Risks   Follow up Falls prevention discussed;Falls evaluation completed Falls evaluation completed Falls evaluation completed Falls evaluation completed     MEDICARE RISK AT HOME:  Medicare Risk at Home Any stairs in or around the home?: (Patient-Rptd) Yes If so, are there any without handrails?: (Patient-Rptd) No Home free of loose throw rugs in walkways, pet beds, electrical cords, etc?: (Patient-Rptd) Yes Adequate lighting in your home to reduce risk of falls?: (Patient-Rptd) Yes Life alert?: (Patient-Rptd) No Use of a cane, walker or w/c?: (Patient-Rptd) No Grab bars in the bathroom?: (Patient-Rptd) Yes Shower chair or bench in shower?: (Patient-Rptd) Yes Elevated toilet seat or a handicapped toilet?: (Patient-Rptd) No  TIMED UP AND GO:  Was the test performed?  No  Cognitive Function: 6CIT completed        09/01/2023    1:58 PM 07/25/2021   10:05 AM 08/11/2018   11:29 AM  6CIT Screen  What Year? 0 points 0 points 0 points  What month? 0 points 0 points 0 points  What time? 0 points 0 points 0 points  Count back from 20 0 points 0 points 0 points  Months in reverse 0 points 0 points 0 points  Repeat phrase 0 points 4 points 0 points  Total Score 0 points 4 points 0 points    Immunizations Immunization History  Administered Date(s) Administered   Fluad Quad(high Dose 65+) 04/08/2022   Influenza, High Dose Seasonal PF 05/16/2023   Influenza-Unspecified 04/18/2019, 03/06/2020   PFIZER(Purple Top)SARS-COV-2 Vaccination 06/23/2019, 07/15/2019   Pneumococcal Conjugate-13 09/26/2019   Pneumococcal Polysaccharide-23 08/11/2018   Td 09/19/2004   Tdap 04/25/2013   Zoster Recombinant(Shingrix) 11/08/2021, 04/25/2022    Screening Tests Health Maintenance  Topic Date Due   COVID-19 Vaccine (3 - 2024-25 season) 02/01/2023   DTaP/Tdap/Td (3 - Td or Tdap)  04/26/2023   Colonoscopy  11/10/2023   INFLUENZA VACCINE  01/01/2024   MAMMOGRAM  03/30/2024   Medicare Annual Wellness (AWV)  08/31/2024   DEXA SCAN  03/30/2028   Pneumonia Vaccine 9+ Years old  Completed   Hepatitis C Screening  Completed   Zoster Vaccines- Shingrix  Completed   HPV VACCINES  Aged Out    Health Maintenance  Health Maintenance Due  Topic Date Due   COVID-19 Vaccine (3 - 2024-25 season) 02/01/2023   DTaP/Tdap/Td (3 - Td or Tdap) 04/26/2023   Colonoscopy  11/10/2023   Health Maintenance Items Addressed: Discussed the need to update her tetanus vaccine. Patient declines covid vaccine.  Additional Screening:  Vision Screening: Recommended annual ophthalmology exams for early detection of glaucoma and other disorders of the eye. Up to date [facility]  Dental Screening: Recommended annual dental exams for proper oral hygiene  Community Resource Referral / Chronic Care Management: CRR required this visit?  No   CCM required this visit?  No     Plan:     I have personally reviewed and noted the following in the patient's chart:   Medical and social history Use of alcohol, tobacco or illicit drugs  Current medications and supplements including opioid prescriptions. Patient is not currently taking opioid prescriptions. Functional ability and status Nutritional status Physical activity Advanced directives List of other physicians Hospitalizations, surgeries, and ER visits in previous 12 months  Vitals Screenings to include cognitive, depression, and falls Referrals and appointments  In addition, I have reviewed and discussed with patient certain preventive protocols, quality metrics, and best practice recommendations. A written personalized care plan for preventive services as well as general preventive health recommendations were provided to patient.     Sydell Axon, LPN   01/01/9561   After Visit Summary: (MyChart) Due to this being a telephonic  visit, the after visit summary with patients personalized plan was offered to patient via MyChart   Notes: Please refer to Routing Comments.

## 2023-09-08 ENCOUNTER — Telehealth: Payer: Self-pay

## 2023-09-08 NOTE — Telephone Encounter (Signed)
-----   Message from Dana Allan sent at 09/06/2023  6:00 PM EDT ----- Did she call the pharmacy for refills? Her Ferritin when last checked was low ----- Message ----- From: Binnie Kand, LPN Sent: 01/01/9561   2:18 PM EDT To: Dana Allan, MD  Performed AWV today. When reviewing patients medication was advised that she stopped taking the iron medication about 2 months ago when it ran out. Patient wants to know if she is to continue the medication and if so a refill needs to be sent to [facility].

## 2023-09-08 NOTE — Telephone Encounter (Signed)
 Called pharmacy and they said they would get it filed for pt . I did let pt know

## 2023-09-16 ENCOUNTER — Telehealth: Payer: Self-pay

## 2023-09-16 NOTE — Telephone Encounter (Unsigned)
 Copied from CRM [id number]. Topic: Appointments - Transfer of Care >> Sep 16, 2023  3:56 PM Luane Rumps D wrote: Pt is requesting to transfer FROM: Valli Gaw, MD Pt is requesting to transfer TO: Bluford Burkitt, NP  Reason for requested transfer: Dr. Sueanne Emerald leaving practice.  It is the responsibility of the team the patient would like to transfer to (Dr. Julietta Ogren, Lorice Roof, NP ) to reach out to the patient if for any reason this transfer is not acceptable.

## 2023-09-18 ENCOUNTER — Other Ambulatory Visit: Payer: Self-pay | Admitting: Family Medicine

## 2023-09-18 DIAGNOSIS — M1990 Unspecified osteoarthritis, unspecified site: Secondary | ICD-10-CM

## 2023-10-19 DIAGNOSIS — M75122 Complete rotator cuff tear or rupture of left shoulder, not specified as traumatic: Secondary | ICD-10-CM | POA: Diagnosis not present

## 2023-10-19 DIAGNOSIS — M7582 Other shoulder lesions, left shoulder: Secondary | ICD-10-CM | POA: Diagnosis not present

## 2023-11-03 ENCOUNTER — Other Ambulatory Visit: Payer: Self-pay | Admitting: Surgery

## 2023-11-03 DIAGNOSIS — M75122 Complete rotator cuff tear or rupture of left shoulder, not specified as traumatic: Secondary | ICD-10-CM

## 2023-11-03 DIAGNOSIS — M7582 Other shoulder lesions, left shoulder: Secondary | ICD-10-CM

## 2023-11-06 ENCOUNTER — Ambulatory Visit
Admission: RE | Admit: 2023-11-06 | Discharge: 2023-11-06 | Disposition: A | Discharge status: Home / Self Care | Source: Ambulatory Visit | Attending: Surgery | Admitting: Surgery

## 2023-11-06 DIAGNOSIS — M75122 Complete rotator cuff tear or rupture of left shoulder, not specified as traumatic: Secondary | ICD-10-CM | POA: Insufficient documentation

## 2023-11-06 DIAGNOSIS — M25412 Effusion, left shoulder: Secondary | ICD-10-CM | POA: Diagnosis not present

## 2023-11-06 DIAGNOSIS — M7582 Other shoulder lesions, left shoulder: Secondary | ICD-10-CM | POA: Diagnosis not present

## 2023-11-06 DIAGNOSIS — R609 Edema, unspecified: Secondary | ICD-10-CM | POA: Diagnosis not present

## 2023-11-06 DIAGNOSIS — M948X1 Other specified disorders of cartilage, shoulder: Secondary | ICD-10-CM | POA: Diagnosis not present

## 2023-11-16 ENCOUNTER — Other Ambulatory Visit: Payer: Self-pay | Admitting: Surgery

## 2023-11-16 DIAGNOSIS — M75122 Complete rotator cuff tear or rupture of left shoulder, not specified as traumatic: Secondary | ICD-10-CM | POA: Diagnosis not present

## 2023-11-16 DIAGNOSIS — M7582 Other shoulder lesions, left shoulder: Secondary | ICD-10-CM | POA: Diagnosis not present

## 2023-11-16 DIAGNOSIS — M12812 Other specific arthropathies, not elsewhere classified, left shoulder: Secondary | ICD-10-CM | POA: Diagnosis not present

## 2023-11-18 ENCOUNTER — Encounter
Admission: RE | Admit: 2023-11-18 | Discharge: 2023-11-18 | Disposition: A | Discharge status: Home / Self Care | Source: Ambulatory Visit | Attending: Surgery | Admitting: Surgery

## 2023-11-18 ENCOUNTER — Other Ambulatory Visit: Payer: Self-pay

## 2023-11-18 VITALS — BP 121/67 | HR 74 | Resp 16 | Ht 66.0 in | Wt 173.5 lb

## 2023-11-18 DIAGNOSIS — I493 Ventricular premature depolarization: Secondary | ICD-10-CM | POA: Diagnosis not present

## 2023-11-18 DIAGNOSIS — I447 Left bundle-branch block, unspecified: Secondary | ICD-10-CM | POA: Diagnosis not present

## 2023-11-18 DIAGNOSIS — Z01818 Encounter for other preprocedural examination: Secondary | ICD-10-CM | POA: Diagnosis not present

## 2023-11-18 DIAGNOSIS — Z01812 Encounter for preprocedural laboratory examination: Secondary | ICD-10-CM

## 2023-11-18 HISTORY — DX: Other shoulder lesions, left shoulder: M75.82

## 2023-11-18 HISTORY — DX: Complete rotator cuff tear or rupture of left shoulder, not specified as traumatic: M75.122

## 2023-11-18 HISTORY — DX: Anemia, unspecified: D64.9

## 2023-11-18 LAB — CBC WITH DIFFERENTIAL/PLATELET
Abs Immature Granulocytes: 0.02 10*3/uL (ref 0.00–0.07)
Basophils Absolute: 0 10*3/uL (ref 0.0–0.1)
Basophils Relative: 1 %
Eosinophils Absolute: 0.2 10*3/uL (ref 0.0–0.5)
Eosinophils Relative: 3 %
HCT: 38.4 % (ref 36.0–46.0)
Hemoglobin: 12.4 g/dL (ref 12.0–15.0)
Immature Granulocytes: 0 %
Lymphocytes Relative: 22 %
Lymphs Abs: 1.2 10*3/uL (ref 0.7–4.0)
MCH: 29.8 pg (ref 26.0–34.0)
MCHC: 32.3 g/dL (ref 30.0–36.0)
MCV: 92.3 fL (ref 80.0–100.0)
Monocytes Absolute: 0.4 10*3/uL (ref 0.1–1.0)
Monocytes Relative: 7 %
Neutro Abs: 3.7 10*3/uL (ref 1.7–7.7)
Neutrophils Relative %: 67 %
Platelets: 254 10*3/uL (ref 150–400)
RBC: 4.16 MIL/uL (ref 3.87–5.11)
RDW: 14.5 % (ref 11.5–15.5)
WBC: 5.5 10*3/uL (ref 4.0–10.5)
nRBC: 0 % (ref 0.0–0.2)

## 2023-11-18 LAB — URINALYSIS, ROUTINE W REFLEX MICROSCOPIC
Bilirubin Urine: NEGATIVE
Glucose, UA: NEGATIVE mg/dL
Hgb urine dipstick: NEGATIVE
Ketones, ur: NEGATIVE mg/dL
Leukocytes,Ua: NEGATIVE
Nitrite: NEGATIVE
Protein, ur: NEGATIVE mg/dL
Specific Gravity, Urine: 1.006 (ref 1.005–1.030)
pH: 6 (ref 5.0–8.0)

## 2023-11-18 LAB — COMPREHENSIVE METABOLIC PANEL WITH GFR
ALT: 24 U/L (ref 0–44)
AST: 27 U/L (ref 15–41)
Albumin: 3.9 g/dL (ref 3.5–5.0)
Alkaline Phosphatase: 59 U/L (ref 38–126)
Anion gap: 9 (ref 5–15)
BUN: 27 mg/dL — ABNORMAL HIGH (ref 8–23)
CO2: 27 mmol/L (ref 22–32)
Calcium: 9.1 mg/dL (ref 8.9–10.3)
Chloride: 103 mmol/L (ref 98–111)
Creatinine, Ser: 0.94 mg/dL (ref 0.44–1.00)
GFR, Estimated: >60 mL/min (ref >60)
Glucose, Bld: 109 mg/dL — ABNORMAL HIGH (ref 70–99)
Potassium: 3.6 mmol/L (ref 3.5–5.1)
Sodium: 139 mmol/L (ref 135–145)
Total Bilirubin: 0.6 mg/dL (ref 0.0–1.2)
Total Protein: 6.8 g/dL (ref 6.5–8.1)

## 2023-11-18 LAB — SURGICAL PCR SCREEN
MRSA, PCR: NEGATIVE
Staphylococcus aureus: POSITIVE — AB

## 2023-11-18 MED ORDER — LACTATED RINGERS IV SOLN: INTRAVENOUS | Status: DC

## 2023-11-18 MED ORDER — CEFAZOLIN SODIUM-DEXTROSE 2-4 GM/100ML-% IV SOLN: 2.0000 g | INTRAVENOUS | Status: AC

## 2023-11-18 MED ORDER — TRANEXAMIC ACID-NACL 1000-0.7 MG/100ML-% IV SOLN: 1000.0000 mg | INTRAVENOUS | Status: AC

## 2023-11-18 MED ORDER — ORAL CARE MOUTH RINSE
15.0000 mL | Freq: Once | OROMUCOSAL | Status: AC
Start: 2023-11-18 — End: 2023-11-19

## 2023-11-18 MED ORDER — CHLORHEXIDINE GLUCONATE 0.12 % MT SOLN: 15.0000 mL | Freq: Once | OROMUCOSAL | Status: AC

## 2023-11-18 NOTE — Patient Instructions (Addendum)
 Your procedure is scheduled on: 11/19/23  Report to the Registration Desk on the 1st floor of the [facility]. To find out your arrival time, please call [phone] between 1PM - 3PM on: 11/18/23  If your arrival time is 6:00 am, do not arrive before that time as the [facility] entrance doors do not open until 6:00 am.  REMEMBER: Instructions that are not followed completely may result in serious medical risk, up to and including death; or upon the discretion of your surgeon and anesthesiologist your surgery may need to be rescheduled.  Do not eat food after midnight the night before surgery.  No gum chewing or hard candies.  You may however, drink CLEAR liquids up to 2 hours before you are scheduled to arrive for your surgery. Do not drink anything within 2 hours of your scheduled arrival time.  Clear liquids include: - water  - apple juice without pulp - gatorade (not RED colors) - black coffee or tea (Do NOT add milk or creamers to the coffee or tea) Do NOT drink anything that is not on this list.  In addition, your doctor has ordered for you to drink the provided:  Ensure Pre-Surgery Clear Carbohydrate Drink  Drinking this carbohydrate drink up to two hours before surgery helps to reduce insulin resistance and improve patient outcomes.   One week prior to surgery: Stop Anti-inflammatories (NSAIDS) such as Advil, Aleve, Ibuprofen, Motrin, Naproxen, Naprosyn and Aspirin based products such as Excedrin, Goody's Powder, BC Powder.  You may take Tylenol  if needed for pain up until the day of surgery.  Stop ANY OVER THE COUNTER supplements until after surgery.   Continue taking all of your other prescription medications up until the day before surgery.  ON THE DAY OF SURGERY ONLY TAKE THESE MEDICATIONS WITH SIPS OF WATER:  celecoxib  (CELEBREX )  colestipol  (COLESTID )    No Alcohol for 24 hours before or after surgery.  No Smoking including e-cigarettes for 24 hours before  surgery.  No chewable tobacco products for at least 6 hours before surgery.  No nicotine patches on the day of surgery.  Do not use any recreational drugs for at least a week (preferably 2 weeks) before your surgery.  Please be advised that the combination of cocaine and anesthesia may have negative outcomes, up to and including death. If you test positive for cocaine, your surgery will be cancelled.  On the morning of surgery brush your teeth with toothpaste and water, you may rinse your mouth with mouthwash if you wish. Do not swallow any toothpaste or mouthwash.  Use CHG Soap or wipes as directed on instruction sheet.  Do not wear jewelry, make-up, hairpins, clips or nail polish.  For welded (permanent) jewelry: bracelets, anklets, waist bands, etc.  Please have this removed prior to surgery.  If it is not removed, there is a chance that hospital personnel will need to cut it off on the day of surgery.  Do not wear lotions, powders, or perfumes.   Do not shave body hair from the neck down 48 hours before surgery.  Contact lenses, hearing aids and dentures may not be worn into surgery.  Do not bring valuables to the hospital. [facility] is not responsible for any missing/lost belongings or valuables.   Notify your doctor if there is any change in your medical condition (cold, fever, infection).  Wear comfortable clothing (specific to your surgery type) to the hospital.  After surgery, you can help prevent lung complications by  doing breathing exercises.  Take deep breaths and cough every 1-2 hours. Your doctor may order a device called an Incentive Spirometer to help you take deep breaths. When coughing or sneezing, hold a pillow firmly against your incision with both hands. This is called "splinting." Doing this helps protect your incision. It also decreases belly discomfort.  If you are being admitted to the hospital overnight, leave your suitcase in the car. After surgery it  may be brought to your room.  In case of increased patient census, it may be necessary for you, the patient, to continue your postoperative care in the Same Day Surgery department.  If you are being discharged the day of surgery, you will not be allowed to drive home. You will need a responsible individual to drive you home and stay with you for 24 hours after surgery.   If you are taking public transportation, you will need to have a responsible individual with you.  Please call the Pre-admissions Testing Dept. at [phone] if you have any questions about these instructions.  Surgery Visitation Policy:  Patients having surgery or a procedure may have two visitors.  Children under the age of 34 must have an adult with them who is not the patient.  Inpatient Visitation:    Visiting hours are 7 a.m. to 8 p.m. Up to four visitors are allowed at one time in a patient room. The visitors may rotate out with other people during the day.  One visitor age 52 or older may stay with the patient overnight and must be in the room by 8 p.m.   Pre-operative 5 CHG Bath Instructions   You can play a key role in reducing the risk of infection after surgery. Your skin needs to be as free of germs as possible. You can reduce the number of germs on your skin by washing with CHG (chlorhexidine  gluconate) soap before surgery. CHG is an antiseptic soap that kills germs and continues to kill germs even after washing.   DO NOT use if you have an allergy to chlorhexidine /CHG or antibacterial soaps. If your skin becomes reddened or irritated, stop using the CHG and notify one of our RNs at [phone].   Please shower with the CHG soap starting 4 days before surgery using the following schedule:   06/18 - 06/19.  Please keep in mind the following:  DO NOT shave, including legs and underarms, starting the day of your first shower.   You may shave your face at any point before/day of surgery.  Place  clean sheets on your bed the day you start using CHG soap. Use a clean washcloth (not used since being washed) for each shower. DO NOT sleep with pets once you start using the CHG.   CHG Shower Instructions:  If you choose to wash your hair and private area, wash first with your normal shampoo/soap.  After you use shampoo/soap, rinse your hair and body thoroughly to remove shampoo/soap residue.  Turn the water OFF and apply about 3 tablespoons (45 ml) of CHG soap to a CLEAN washcloth.  Apply CHG soap ONLY FROM YOUR NECK DOWN TO YOUR TOES (washing for 3-5 minutes)  DO NOT use CHG soap on face, private areas, open wounds, or sores.  Pay special attention to the area where your surgery is being performed.  If you are having back surgery, having someone wash your back for you may be helpful. Wait 2 minutes after CHG soap is applied, then you may  rinse off the CHG soap.  Pat dry with a clean towel  Put on clean clothes/pajamas   If you choose to wear lotion, please use ONLY the CHG-compatible lotions on the back of this paper.     Additional instructions for the day of surgery: DO NOT APPLY any lotions, deodorants, cologne, or perfumes.   Put on clean/comfortable clothes.  Brush your teeth.  Ask your nurse before applying any prescription medications to the skin.      CHG Compatible Lotions   Aveeno Moisturizing lotion  Cetaphil Moisturizing Cream  Cetaphil Moisturizing Lotion  Clairol Herbal Essence Moisturizing Lotion, Dry Skin  Clairol Herbal Essence Moisturizing Lotion, Extra Dry Skin  Clairol Herbal Essence Moisturizing Lotion, Normal Skin  Curel Age Defying Therapeutic Moisturizing Lotion with Alpha Hydroxy  Curel Extreme Care Body Lotion  Curel Soothing Hands Moisturizing Hand Lotion  Curel Therapeutic Moisturizing Cream, Fragrance-Free  Curel Therapeutic Moisturizing Lotion, Fragrance-Free  Curel Therapeutic Moisturizing Lotion, Original Formula  Eucerin Daily Replenishing  Lotion  Eucerin Dry Skin Therapy Plus Alpha Hydroxy Crme  Eucerin Dry Skin Therapy Plus Alpha Hydroxy Lotion  Eucerin Original Crme  Eucerin Original Lotion  Eucerin Plus Crme Eucerin Plus Lotion  Eucerin TriLipid Replenishing Lotion  Keri Anti-Bacterial Hand Lotion  Keri Deep Conditioning Original Lotion Dry Skin Formula Softly Scented  Keri Deep Conditioning Original Lotion, Fragrance Free Sensitive Skin Formula  Keri Lotion Fast Absorbing Fragrance Free Sensitive Skin Formula  Keri Lotion Fast Absorbing Softly Scented Dry Skin Formula  Keri Original Lotion  Keri Skin Renewal Lotion Keri Silky Smooth Lotion  Keri Silky Smooth Sensitive Skin Lotion  Nivea Body Creamy Conditioning Oil  Nivea Body Extra Enriched Lotion  Nivea Body Original Lotion  Nivea Body Sheer Moisturizing Lotion Nivea Crme  Nivea Skin Firming Lotion  NutraDerm 30 Skin Lotion  NutraDerm Skin Lotion  NutraDerm Therapeutic Skin Cream  NutraDerm Therapeutic Skin Lotion  ProShield Protective Hand Cream  Provon moisturizing lotion  Preparing for Total Shoulder Arthroplasty  Before surgery, you can play an important role by reducing the number of germs on your skin by using the following products:  Benzoyl Peroxide Gel  o Reduces the number of germs present on the skin  o Applied twice a day to shoulder area starting two days before surgery  Chlorhexidine  Gluconate (CHG) Soap  o An antiseptic cleaner that kills germs and bonds with the skin to continue killing germs even after washing  o Used for showering the night before surgery and morning of surgery   BENZOYL PEROXIDE 5% GEL  Please do not use if you have an allergy to benzoyl peroxide. If your skin becomes reddened/irritated stop using the benzoyl peroxide.  Starting two days before surgery, apply as follows:  1. Apply benzoyl peroxide in the morning and at night. Apply after taking a shower. If you are not taking a shower, clean entire  shoulder front, back, and side along with the armpit with a clean wet washcloth.  2. Place a quarter-sized dollop on your shoulder and rub in thoroughly, making sure to cover the front, back, and side of your shoulder, along with the armpit.  2 days before ____ AM ____ PM 1 day before ____ AM __X__ PM  3. Do this twice a day for two days. (Last application is the night before surgery, AFTER using the CHG soap).  4. Do NOT apply benzoyl peroxide gel on the day of surgery.  How to Use an Facilities manager  An incentive spirometer  is a tool that measures how well you are filling your lungs with each breath. Learning to take long, deep breaths using this tool can help you keep your lungs clear and active. This may help to reverse or lessen your chance of developing breathing (pulmonary) problems, especially infection. You may be asked to use a spirometer: After a surgery. If you have a lung problem or a history of smoking. After a long period of time when you have been unable to move or be active. If the spirometer includes an indicator to show the highest number that you have reached, your health care provider or respiratory therapist will help you set a goal. Keep a log of your progress as told by your health care provider. What are the risks? Breathing too quickly may cause dizziness or cause you to pass out. Take your time so you do not get dizzy or light-headed. If you are in pain, you may need to take pain medicine before doing incentive spirometry. It is harder to take a deep breath if you are having pain. How to use your incentive spirometer  Sit up on the edge of your bed or on a chair. Hold the incentive spirometer so that it is in an upright position. Before you use the spirometer, breathe out normally. Place the mouthpiece in your mouth. Make sure your lips are closed tightly around it. Breathe in slowly and as deeply as you can through your mouth, causing the piston or the ball  to rise toward the top of the chamber. Hold your breath for 3-5 seconds, or for as long as possible. If the spirometer includes a coach indicator, use this to guide you in breathing. Slow down your breathing if the indicator goes above the marked areas. Remove the mouthpiece from your mouth and breathe out normally. The piston or ball will return to the bottom of the chamber. Rest for a few seconds, then repeat the steps 10 or more times. Take your time and take a few normal breaths between deep breaths so that you do not get dizzy or light-headed. Do this every 1-2 hours when you are awake. If the spirometer includes a goal marker to show the highest number you have reached (best effort), use this as a goal to work toward during each repetition. After each set of 10 deep breaths, cough a few times. This will help to make sure that your lungs are clear. If you have an incision on your chest or abdomen from surgery, place a pillow or a rolled-up towel firmly against the incision when you cough. This can help to reduce pain while taking deep breaths and coughing. General tips When you are able to get out of bed: Walk around often. Continue to take deep breaths and cough in order to clear your lungs. Keep using the incentive spirometer until your health care provider says it is okay to stop using it. If you have been in the hospital, you may be told to keep using the spirometer at home. Contact a health care provider if: You are having difficulty using the spirometer. You have trouble using the spirometer as often as instructed. Your pain medicine is not giving enough relief for you to use the spirometer as told. You have a fever. Get help right away if: You develop shortness of breath. You develop a cough with bloody mucus from the lungs. You have fluid or blood coming from an incision site after you cough. Summary An incentive spirometer is  a tool that can help you learn to take long, deep  breaths to keep your lungs clear and active. You may be asked to use a spirometer after a surgery, if you have a lung problem or a history of smoking, or if you have been inactive for a long period of time. Use your incentive spirometer as instructed every 1-2 hours while you are awake. If you have an incision on your chest or abdomen, place a pillow or a rolled-up towel firmly against your incision when you cough. This will help to reduce pain. Get help right away if you have shortness of breath, you cough up bloody mucus, or blood comes from your incision when you cough. This information is not intended to replace advice given to you by your health care provider. Make sure you discuss any questions you have with your health care provider. Document Revised: 08/08/2019 Document Reviewed: 08/08/2019 Elsevier Patient Education  2023 [employer].

## 2023-11-19 ENCOUNTER — Encounter: Payer: Self-pay | Admitting: Surgery

## 2023-11-19 ENCOUNTER — Other Ambulatory Visit: Payer: Self-pay

## 2023-11-19 ENCOUNTER — Ambulatory Visit

## 2023-11-19 ENCOUNTER — Ambulatory Visit
Admission: RE | Admit: 2023-11-19 | Discharge: 2023-11-19 | Disposition: A | Discharge status: Home / Self Care | Attending: Surgery | Admitting: Surgery

## 2023-11-19 ENCOUNTER — Ambulatory Visit: Payer: Self-pay | Admitting: Urgent Care

## 2023-11-19 ENCOUNTER — Encounter
Admission: RE | Disposition: A | Discharge status: Home / Self Care | Payer: Self-pay | Source: Home / Self Care | Attending: Surgery

## 2023-11-19 DIAGNOSIS — D649 Anemia, unspecified: Secondary | ICD-10-CM

## 2023-11-19 DIAGNOSIS — G709 Myoneural disorder, unspecified: Secondary | ICD-10-CM | POA: Insufficient documentation

## 2023-11-19 DIAGNOSIS — M25812 Other specified joint disorders, left shoulder: Secondary | ICD-10-CM | POA: Insufficient documentation

## 2023-11-19 DIAGNOSIS — Z96612 Presence of left artificial shoulder joint: Secondary | ICD-10-CM | POA: Diagnosis not present

## 2023-11-19 DIAGNOSIS — M75122 Complete rotator cuff tear or rupture of left shoulder, not specified as traumatic: Secondary | ICD-10-CM | POA: Insufficient documentation

## 2023-11-19 DIAGNOSIS — M12812 Other specific arthropathies, not elsewhere classified, left shoulder: Secondary | ICD-10-CM | POA: Diagnosis not present

## 2023-11-19 DIAGNOSIS — I10 Essential (primary) hypertension: Secondary | ICD-10-CM | POA: Diagnosis not present

## 2023-11-19 DIAGNOSIS — Z79899 Other long term (current) drug therapy: Secondary | ICD-10-CM | POA: Insufficient documentation

## 2023-11-19 DIAGNOSIS — K449 Diaphragmatic hernia without obstruction or gangrene: Secondary | ICD-10-CM | POA: Insufficient documentation

## 2023-11-19 DIAGNOSIS — M7522 Bicipital tendinitis, left shoulder: Secondary | ICD-10-CM | POA: Diagnosis not present

## 2023-11-19 DIAGNOSIS — M778 Other enthesopathies, not elsewhere classified: Secondary | ICD-10-CM | POA: Diagnosis not present

## 2023-11-19 DIAGNOSIS — M7582 Other shoulder lesions, left shoulder: Secondary | ICD-10-CM | POA: Diagnosis not present

## 2023-11-19 DIAGNOSIS — M129 Arthropathy, unspecified: Secondary | ICD-10-CM | POA: Diagnosis not present

## 2023-11-19 DIAGNOSIS — M75112 Incomplete rotator cuff tear or rupture of left shoulder, not specified as traumatic: Secondary | ICD-10-CM | POA: Diagnosis not present

## 2023-11-19 DIAGNOSIS — K219 Gastro-esophageal reflux disease without esophagitis: Secondary | ICD-10-CM | POA: Insufficient documentation

## 2023-11-19 DIAGNOSIS — Z471 Aftercare following joint replacement surgery: Secondary | ICD-10-CM | POA: Diagnosis not present

## 2023-11-19 DIAGNOSIS — G8918 Other acute postprocedural pain: Secondary | ICD-10-CM | POA: Diagnosis not present

## 2023-11-19 HISTORY — PX: BICEPT TENODESIS: SHX5116

## 2023-11-19 HISTORY — PX: REVERSE SHOULDER ARTHROPLASTY: SHX5054

## 2023-11-19 SURGERY — ARTHROPLASTY, SHOULDER, TOTAL, REVERSE
Anesthesia: General | Site: Shoulder | Laterality: Left

## 2023-11-19 MED ORDER — ACETAMINOPHEN 325 MG PO TABS: 325.0000 mg | ORAL_TABLET | Freq: Four times a day (QID) | ORAL | Status: DC | PRN

## 2023-11-19 MED ORDER — IRON (FERROUS SULFATE) 325 (65 FE) MG PO TABS: 325.0000 mg | ORAL_TABLET | ORAL | Status: DC

## 2023-11-19 MED ORDER — LIDOCAINE HCL (CARDIAC) PF 100 MG/5ML IV SOSY: PREFILLED_SYRINGE | INTRAVENOUS | Status: DC | PRN

## 2023-11-19 MED ORDER — ACETAMINOPHEN 10 MG/ML IV SOLN
INTRAVENOUS | Status: AC
  Filled 2023-11-19: qty 100

## 2023-11-19 MED ORDER — EPHEDRINE SULFATE-NACL 50-0.9 MG/10ML-% IV SOSY
PREFILLED_SYRINGE | INTRAVENOUS | Status: DC | PRN
Start: 2023-11-19 — End: 2023-11-19

## 2023-11-19 MED ORDER — CEFAZOLIN SODIUM-DEXTROSE 2-4 GM/100ML-% IV SOLN: 2.0000 g | Freq: Four times a day (QID) | INTRAVENOUS | Status: DC

## 2023-11-19 MED ORDER — CHLORHEXIDINE GLUCONATE 4 % EX SOLN: 1.0000 | CUTANEOUS | 1 refills | Status: DC

## 2023-11-19 MED ORDER — LIDOCAINE HCL (PF) 1 % IJ SOLN
INTRAMUSCULAR | Status: AC
  Filled 2023-11-19: qty 5

## 2023-11-19 MED ORDER — BUPIVACAINE-EPINEPHRINE (PF) 0.5% -1:200000 IJ SOLN
INTRAMUSCULAR | Status: AC
  Filled 2023-11-19: qty 30

## 2023-11-19 MED ORDER — FENTANYL CITRATE PF 50 MCG/ML IJ SOSY
PREFILLED_SYRINGE | INTRAMUSCULAR | Status: AC
  Filled 2023-11-19: qty 1

## 2023-11-19 MED ORDER — DEXMEDETOMIDINE HCL IN NACL 80 MCG/20ML IV SOLN: INTRAVENOUS | Status: DC | PRN

## 2023-11-19 MED ORDER — SUGAMMADEX SODIUM 200 MG/2ML IV SOLN: INTRAVENOUS | Status: DC | PRN

## 2023-11-19 MED ORDER — METOCLOPRAMIDE HCL 10 MG PO TABS: 5.0000 mg | ORAL_TABLET | Freq: Three times a day (TID) | ORAL | Status: DC | PRN

## 2023-11-19 MED ORDER — BUPIVACAINE-EPINEPHRINE (PF) 0.5% -1:200000 IJ SOLN
INTRAMUSCULAR | Status: DC | PRN
  Administered 2023-11-19: 30 mL

## 2023-11-19 MED ORDER — ACETAMINOPHEN 10 MG/ML IV SOLN: INTRAVENOUS | Status: DC | PRN

## 2023-11-19 MED ORDER — CHLORHEXIDINE GLUCONATE 0.12 % MT SOLN
OROMUCOSAL | Status: AC
  Filled 2023-11-19: qty 15

## 2023-11-19 MED ORDER — MIDAZOLAM HCL 2 MG/2ML IJ SOLN
INTRAMUSCULAR | Status: AC
Start: 2023-11-19 — End: 2023-11-19
  Filled 2023-11-19: qty 2

## 2023-11-19 MED ORDER — SODIUM CHLORIDE 0.9 % IV SOLN: INTRAVENOUS | Status: DC

## 2023-11-19 MED ORDER — DROPERIDOL 2.5 MG/ML IJ SOLN: 0.6250 mg | Freq: Once | INTRAMUSCULAR | Status: DC | PRN

## 2023-11-19 MED ORDER — FENTANYL CITRATE (PF) 100 MCG/2ML IJ SOLN: 25.0000 ug | INTRAMUSCULAR | Status: DC | PRN

## 2023-11-19 MED ORDER — KETOROLAC TROMETHAMINE 15 MG/ML IJ SOLN: 15.0000 mg | Freq: Once | INTRAMUSCULAR | Status: AC

## 2023-11-19 MED ORDER — ROCURONIUM BROMIDE 100 MG/10ML IV SOLN: INTRAVENOUS | Status: DC | PRN

## 2023-11-19 MED ORDER — MUPIROCIN 2 % EX OINT: 1.0000 | TOPICAL_OINTMENT | Freq: Two times a day (BID) | CUTANEOUS | 0 refills | Status: AC

## 2023-11-19 MED ORDER — SODIUM CHLORIDE 0.9 % IR SOLN
Status: DC | PRN
  Administered 2023-11-19: 3000 mL

## 2023-11-19 MED ORDER — BUPIVACAINE HCL (PF) 0.5 % IJ SOLN: INTRAMUSCULAR | Status: DC | PRN

## 2023-11-19 MED ORDER — PROPOFOL 1000 MG/100ML IV EMUL
INTRAVENOUS | Status: AC
  Filled 2023-11-19: qty 100

## 2023-11-19 MED ORDER — KETOROLAC TROMETHAMINE 15 MG/ML IJ SOLN
INTRAMUSCULAR | Status: AC
  Filled 2023-11-19: qty 1

## 2023-11-19 MED ORDER — ONDANSETRON HCL 4 MG/2ML IJ SOLN: INTRAMUSCULAR | Status: DC | PRN

## 2023-11-19 MED ORDER — MIDAZOLAM HCL 2 MG/2ML IJ SOLN
INTRAMUSCULAR | Status: AC
  Filled 2023-11-19: qty 2

## 2023-11-19 MED ORDER — ONDANSETRON HCL 4 MG/2ML IJ SOLN: 4.0000 mg | Freq: Four times a day (QID) | INTRAMUSCULAR | Status: DC | PRN

## 2023-11-19 MED ORDER — FENTANYL CITRATE (PF) 100 MCG/2ML IJ SOLN: INTRAMUSCULAR | Status: DC | PRN

## 2023-11-19 MED ORDER — BUPIVACAINE LIPOSOME 1.3 % IJ SUSP
INTRAMUSCULAR | Status: AC
  Filled 2023-11-19: qty 20

## 2023-11-19 MED ORDER — PROPOFOL 10 MG/ML IV BOLUS: INTRAVENOUS | Status: DC | PRN

## 2023-11-19 MED ORDER — BUPIVACAINE LIPOSOME 1.3 % IJ SUSP
INTRAMUSCULAR | Status: DC | PRN
Start: 2023-11-19 — End: 2023-11-19

## 2023-11-19 MED ORDER — CEFAZOLIN SODIUM-DEXTROSE 2-4 GM/100ML-% IV SOLN
INTRAVENOUS | Status: AC
  Filled 2023-11-19: qty 100

## 2023-11-19 MED ORDER — PHENYLEPHRINE HCL-NACL 20-0.9 MG/250ML-% IV SOLN
INTRAVENOUS | Status: DC | PRN
  Administered 2023-11-19: 50 ug/min via INTRAVENOUS

## 2023-11-19 MED ORDER — DEXAMETHASONE SODIUM PHOSPHATE 10 MG/ML IJ SOLN: INTRAMUSCULAR | Status: DC | PRN

## 2023-11-19 MED ORDER — LIDOCAINE HCL (PF) 1 % IJ SOLN
INTRAMUSCULAR | Status: DC | PRN
Start: 2023-11-19 — End: 2023-11-19

## 2023-11-19 MED ORDER — MIDAZOLAM HCL 2 MG/2ML IJ SOLN: 2.0000 mg | Freq: Once | INTRAMUSCULAR | Status: AC

## 2023-11-19 MED ORDER — FENTANYL CITRATE PF 50 MCG/ML IJ SOSY: 50.0000 ug | PREFILLED_SYRINGE | Freq: Once | INTRAMUSCULAR | Status: AC

## 2023-11-19 MED ORDER — FENTANYL CITRATE (PF) 100 MCG/2ML IJ SOLN
INTRAMUSCULAR | Status: AC
Start: 2023-11-19 — End: 2023-11-19
  Filled 2023-11-19: qty 2

## 2023-11-19 MED ORDER — BUPIVACAINE HCL (PF) 0.5 % IJ SOLN
INTRAMUSCULAR | Status: AC
  Filled 2023-11-19: qty 10

## 2023-11-19 MED ORDER — ONDANSETRON HCL 4 MG PO TABS: 4.0000 mg | ORAL_TABLET | Freq: Four times a day (QID) | ORAL | Status: DC | PRN

## 2023-11-19 MED ORDER — OXYCODONE HCL 5 MG PO TABS: 5.0000 mg | ORAL_TABLET | ORAL | 0 refills | Status: DC | PRN

## 2023-11-19 MED ORDER — GLYCOPYRROLATE 0.2 MG/ML IJ SOLN: INTRAMUSCULAR | Status: DC | PRN

## 2023-11-19 MED ORDER — OXYCODONE HCL 5 MG PO TABS: 5.0000 mg | ORAL_TABLET | ORAL | Status: DC | PRN

## 2023-11-19 MED ORDER — METOCLOPRAMIDE HCL 5 MG/ML IJ SOLN: 5.0000 mg | Freq: Three times a day (TID) | INTRAMUSCULAR | Status: DC | PRN

## 2023-11-19 SURGICAL SUPPLY — 64 items
4.5 mm x 45mm locking screw ×1 IMPLANT
BASEPLATE BOSS DRILL (MISCELLANEOUS) IMPLANT
BIT DRILL 3MM FOR 4.5 SCREW (BIT) IMPLANT
BIT DRILL F/BASEPLATE CENTRAL (BIT) IMPLANT
BLADE SAW SAG 25X90X1.19 (BLADE) ×1 IMPLANT
CHLORAPREP W/TINT 26 (MISCELLANEOUS) ×1 IMPLANT
COOLER POLAR GLACIER W/PUMP (MISCELLANEOUS) ×1 IMPLANT
DRAPE INCISE IOBAN 66X45 STRL (DRAPES) ×1 IMPLANT
DRAPE SHEET LG 3/4 BI-LAMINATE (DRAPES) ×1 IMPLANT
DRAPE TABLE BACK 80X90 (DRAPES) ×1 IMPLANT
DRSG OPSITE POSTOP 4X8 (GAUZE/BANDAGES/DRESSINGS) ×1 IMPLANT
ELECT CAUTERY BLADE 6.4 (BLADE) ×1 IMPLANT
ELECTRODE BLDE 4.0 EZ CLN MEGD (MISCELLANEOUS) ×1 IMPLANT
ELECTRODE REM PT RTRN 9FT ADLT (ELECTROSURGICAL) ×1 IMPLANT
GAUZE XEROFORM 1X8 LF (GAUZE/BANDAGES/DRESSINGS) ×1 IMPLANT
GLENOID BSPLAT RVS SHLDR CNTRD (Plate) IMPLANT
GLENOSPHERE CON AETOS 34 RSS (Shoulder) IMPLANT
GLOVE BIO SURGEON STRL SZ7.5 (GLOVE) ×4 IMPLANT
GLOVE BIO SURGEON STRL SZ8 (GLOVE) ×4 IMPLANT
GLOVE BIOGEL PI IND STRL 8 (GLOVE) ×2 IMPLANT
GLOVE INDICATOR 8.0 STRL GRN (GLOVE) ×1 IMPLANT
GOWN STRL REUS W/ TWL LRG LVL3 (GOWN DISPOSABLE) ×2 IMPLANT
GOWN STRL REUS W/ TWL XL LVL3 (GOWN DISPOSABLE) ×1 IMPLANT
GUIDE PIN 2.0 X 150 (WIRE) IMPLANT
HANDLE YANKAUER SUCT OPEN TIP (MISCELLANEOUS) ×1 IMPLANT
HOOD PEEL AWAY T7 (MISCELLANEOUS) ×3 IMPLANT
KIT STABILIZATION SHOULDER (MISCELLANEOUS) ×1 IMPLANT
KIT TURNOVER KIT A (KITS) ×1 IMPLANT
LINER REVERSE RSS 34 SM +3 (Liner) IMPLANT
MANIFOLD NEPTUNE II (INSTRUMENTS) ×1 IMPLANT
MASK FACE SPIDER DISP (MASK) ×1 IMPLANT
MAT ABSORB FLUID 56X50 GRAY (MISCELLANEOUS) ×1 IMPLANT
NDL MAYO CATGUT SZ1 (NEEDLE) IMPLANT
NDL SAFETY ECLIPSE 18X1.5 (NEEDLE) ×1 IMPLANT
NDL SPNL 20GX3.5 QUINCKE YW (NEEDLE) ×1 IMPLANT
NEEDLE MAYO CATGUT SZ1 (NEEDLE) IMPLANT
NEEDLE SPNL 20GX3.5 QUINCKE YW (NEEDLE) ×1 IMPLANT
PACK ARTHROSCOPY SHOULDER (MISCELLANEOUS) ×1 IMPLANT
PAD ARMBOARD POSITIONER FOAM (MISCELLANEOUS) ×1 IMPLANT
PAD WRAPON POLAR SHDR UNIV (MISCELLANEOUS) ×1 IMPLANT
PENCIL SMOKE EVACUATOR (MISCELLANEOUS) ×1 IMPLANT
PIN GUIDE GLENOID 2.5 NT (PIN) IMPLANT
PIN GUIDE NUMERAL 3.5 NT (PIN) IMPLANT
PIN SPEED NON RIM TEMP 3.2X75 (PIN) IMPLANT
SCREW CENTER 4.5X-35 RSS (Screw) IMPLANT
SCREW LOCK 4.5X15 RSS (Screw) IMPLANT
SCREW LOCKING 4.5X30 RSS (Screw) IMPLANT
SCREW LOCKING 4.5X45 (Screw) IMPLANT
SLING ULTRA II LG (MISCELLANEOUS) IMPLANT
SLING ULTRA II M (MISCELLANEOUS) IMPLANT
SOL .9 NS 3000ML IRR UROMATIC (IV SOLUTION) ×1 IMPLANT
SPONGE T-LAP 18X18 ~~LOC~~+RFID (SPONGE) ×2 IMPLANT
STAPLER SKIN PROX 35W (STAPLE) ×1 IMPLANT
STEM HUM AETOS SZ 2 SM RSS (Stem) IMPLANT
SUT ETHIBOND 0 MO6 C/R (SUTURE) ×1 IMPLANT
SUT VIC AB 0 CT1 36 (SUTURE) ×1 IMPLANT
SUT VIC AB 2-0 CT1 TAPERPNT 27 (SUTURE) ×2 IMPLANT
SUTURE FIBERWR #2 38 BLUE 1/2 (SUTURE) ×4 IMPLANT
SYR 10ML LL (SYRINGE) ×1 IMPLANT
SYR 30ML LL (SYRINGE) ×1 IMPLANT
SYR TOOMEY 50ML (SYRINGE) ×1 IMPLANT
TIP FAN IRRIG PULSAVAC PLUS (DISPOSABLE) ×1 IMPLANT
TRAP FLUID SMOKE EVACUATOR (MISCELLANEOUS) ×1 IMPLANT
WATER STERILE IRR 500ML POUR (IV SOLUTION) ×1 IMPLANT

## 2023-11-19 NOTE — Progress Notes (Signed)
 Occupational Therapy Evaluation Patient Details Name: Kelly Nguyen MRN: [id number] DOB: 23-Jan-1956 Today's Date: 11/19/2023   History of Present Illness   ARTHROPLASTY, SHOULDER, TOTAL, REVERSE (Left: Shoulder)     Clinical Impressions Patient was seen for an OT evaluation this date. Pt lives with her husband in multi-level home with 5 steps to enter. Prior to surgery, pt was active and independent. Pt has orders for LUE to be immobilized and will be NWBing per MD. Patient presents with impaired strength/ROM, pain, and sensation to LUE with block not completely resolved yet. These impairments result in a decreased ability to perform self care tasks requiring max-mod assist for application of polar care, compression stockings, and sling/immobilizer. Pt amb up 4 stairs to simulate functional mobility when entering personal home. Pt completed stair with CGA for safety, demonstrating good safety awareness throughout. Pt instructed in polar care mgt, compression stockings mgt, sling/immobilizer mgt, ROM exercises for LUE (with instructions for no shoulder exercises until full sensation has returned), LUE precautions, adaptive strategies for bathing/dressing/toileting/grooming and home/routines modifications to maximize falls prevention, safety, and independence. Handout provided. OT adjusted sling/immobilizer and polar care to improve comfort, optimize positioning, and to maximize skin integrity/safety. Pt and spouse verbalized understanding of all education/training provided and have no further questions regarding post operative care/occupational therapy needs. Will sign off, re consult if acute status changes.   If plan is discharge home, recommend the following:   A little help with walking and/or transfers;A little help with bathing/dressing/bathroom;Assist for transportation;Help with stairs or ramp for entrance     Functional Status Assessment   Patient has had a recent decline in their  functional status and demonstrates the ability to make significant improvements in function in a reasonable and predictable amount of time.     Equipment Recommendations   None recommended by OT     Recommendations for Other Services         Precautions/Restrictions   Precautions Precautions: Shoulder Required Braces or Orthoses: Other Brace (Shoulder imbolizer) Restrictions Weight Bearing Restrictions Per Provider Order: No     Mobility Bed Mobility               General bed mobility comments: NT in recliner pre/post session    Transfers Overall transfer level: Needs assistance Equipment used: None Transfers: Sit to/from Stand Sit to Stand: Contact guard assist, Supervision           General transfer comment: CGA-supervision for safety during amb. Completed stair trails with use of R railing with CGA, no LOB noted, good safety awarness throughout.      Balance Overall balance assessment: Modified Independent                                         ADL either performed or assessed with clinical judgement   ADL Overall ADL's : Needs assistance/impaired                 Upper Body Dressing : Moderate assistance;Sitting Upper Body Dressing Details (indicate cue type and reason): Don/doff gown, shoulder sling, and personal shirt     Toilet Transfer: Ambulation;Supervision/safety Toilet Transfer Details (indicate cue type and reason): Simulated toilet transfer into recliner         Functional mobility during ADLs: Supervision/safety General ADL Comments: CGA-supervision for safety due to mild report of dizziness. Husband present to assist in dressing and shoulder education  Pertinent Vitals/Pain Pain Assessment Pain Assessment: No/denies pain     Extremity/Trunk Assessment Upper Extremity Assessment Upper Extremity Assessment: LUE deficits/detail LUE Deficits / Details: Anticipated L UE deficits post total  shoulder   Lower Extremity Assessment Lower Extremity Assessment: Overall WFL for tasks assessed   Cervical / Trunk Assessment Cervical / Trunk Assessment: Normal   Communication Communication Communication: No apparent difficulties   Cognition Arousal: Lethargic Behavior During Therapy: WFL for tasks assessed/performed Cognition: No apparent impairments             OT - Cognition Comments: A/Ox4, mild lethargic from meds                 Following commands: Intact       Cueing  General Comments   Cueing Techniques: Verbal cues;Gestural cues  Incision D/C/I   Exercises Exercises: Other exercises Other Exercises Other Exercises: Edu: Role of OT eval, shoulder precuations, dressing/bathing techniques, shoulder sling don/doff sequencing, UE exercises   Shoulder Instructions      Home Living Family/patient expects to be discharged to:: Private residence Living Arrangements: Spouse/significant other Available Help at Discharge: Available 24 hours/day Type of Home: House Home Access: Stairs to enter [employer] of Steps: 5 Entrance Stairs-Rails: Can reach both Home Layout: Two level;Able to live on main level with bedroom/bathroom     Bathroom Shower/Tub: Walk-in shower         Home Equipment: None          Prior Functioning/Environment Prior Level of Function : Independent/Modified Independent;Driving                    OT Problem List:     OT Treatment/Interventions:        OT Goals(Current goals can be found in the care plan section)   Acute Rehab OT Goals Patient Stated Goal: Return home with less shoulder pain OT Goal Formulation: With patient Time For Goal Achievement: 12/03/23 Potential to Achieve Goals: Good   OT Frequency:       Co-evaluation              AM-PAC OT 6 Clicks Daily Activity     Outcome Measure Help from another person eating meals?: None Help from another person taking care of  personal grooming?: None Help from another person toileting, which includes using toliet, bedpan, or urinal?: A Little Help from another person bathing (including washing, rinsing, drying)?: A Little Help from another person to put on and taking off regular upper body clothing?: A Little Help from another person to put on and taking off regular lower body clothing?: A Little 6 Click Score: 20   End of Session Equipment Utilized During Treatment: Gait belt Nurse Communication: Mobility status  Activity Tolerance: Patient tolerated treatment well Patient left: in chair;with nursing/sitter in room;with family/visitor present                   Time: 1610-9604 OT Time Calculation (min): 28 min Charges:  OT General Charges $OT Visit: 1 Visit OT Evaluation $OT Eval Low Complexity: 1 Low OT Treatments $Self Care/Home Management : 8-22 mins  Rosaria Common M.S. OTR/L  11/19/23, 5:44 PM

## 2023-11-19 NOTE — Op Note (Signed)
 11/19/2023  3:18 PM  Patient:   Kelly Nguyen  Pre-Op Diagnosis:   Massive irreparable recurrent rotator cuff tear with cuff arthropathy, left shoulder.  Post-Op Diagnosis:   Same with biceps tendinopathy.  Procedure:   Reverse left total shoulder arthroplasty with biceps tenodesis.  Surgeon:   Lonnie Roberts, MD  Assistant:   Edie Goon, PA-C; [place] Lee Napolitano, PA-S  Anesthesia:   General endotracheal with an interscalene block using Exparel  placed preoperatively by the anesthesiologist.  Findings:   As above.  Complications:   None  EBL:   50 cc  Fluids:   700 cc crystalloid  UOP:   None  TT:   None  Drains:   None  Closure:   Staples  Implants:   All press-fit [employer] AETOS system with a size 2 small Meta humeral stem, a 34 mm +3 mm liner, a centered baseplate, and a 34 mm concentric +1 mm laterally offset glenosphere.  Brief Clinical Note:   The patient is a 68 year old female with a history of progressively worsening pain and weakness of the left shoulder.  The symptoms have persisted despite medications, activity modification, etc. The patient's symptoms are consistent with a massive rotator cuff tear and early cuff arthropathy, all of which were confirmed by preoperative imaging. The patient presents at this time for a reverse left total shoulder arthroplasty.  Procedure:   The patient underwent placement of an interscalene block by the anesthesiologist using Exparel  in the preoperative holding area before being brought into the operating room and lain in the supine position. The patient then underwent general endotracheal intubation and anesthesia before the patient was repositioned in the beach chair position using the beach chair positioner. The left shoulder and upper extremity were prepped with ChloraPrep solution before being draped sterilely. Preoperative antibiotics were administered.   A timeout was performed to verify the appropriate surgical site  before a standard anterior approach to the shoulder was made through an approximately 4-5 inch incision. The incision was carried down through the subcutaneous tissues to expose the deltopectoral fascia. The interval between the deltoid and pectoralis muscles was identified and this plane developed, retracting the cephalic vein laterally with the deltoid muscle. The conjoined tendon was identified. Its lateral margin was dissected and the Kolbel self-retraining retractor inserted. The three sisters were identified and cauterized. Bursal tissues were removed to improve visualization.   The biceps tendon was identified near the inferior aspect of the bicipital groove. A soft tissue tenodesis was performed by attaching the biceps tendon to the adjacent pectoralis major tendon using two #0 Ethibond interrupted sutures. The biceps tendon was then transected just proximal to the tenodesis site. The subscapularis tendon was released from its attachment to the lesser tuberosity 1 cm proximal to its insertion and several tagging sutures placed. The inferior capsule was released with care after identifying and protecting the axillary nerve. The proximal humeral cut was made at approximately 20 of retroversion using the extra-medullary guide.   Attention was directed to the humeral side. The humeral cut surface was sized and found to be optimally replicated by the #2 sized component.  The central guide wire was inserted before the metaphyseal region was reamed with the appropriate reamer. The punch was then inserted over the guidewire before the keel was inserted to create the proper position for the fins. The trial #2 metaphyseal humeral stem was inserted, then covered with the appropriate cover.   Attention was redirected to the glenoid. The labrum  was debrided circumferentially before the center of the glenoid was identified. The guidewire was drilled into the glenoid neck using the appropriate guide. After  verifying its position, it was overreamed with the mini-baseplate reamer to create a flat surface before the stem reamer was utilized. The permanent mini-baseplate was impacted into place. It was stabilized with a 35 x 4.5 mm central screw and four peripheral screws. The permanent 34 mm concentric glenosphere with +1 mm of lateral offset was then impacted into place and its Morse taper locking mechanism verified using manual distraction. The central set screw was then advanced and tightened securely.  Returning to the humeral side, the cover plate was removed and trial reductions performed using both the +0 mm and +3 mm liners. The shoulder was then reduced and placed through a range of motion. With the +3 mm insert, the arm demonstrated excellent range of motion as the hand could be brought across the chest to the opposite shoulder and brought to the top of the patient's head and to the patient's ear. The shoulder appeared stable throughout this range of motion. The joint was dislocated and the trial components removed. The permanent size 2 Meta humeral stem was impacted into place with care taken to maintain the appropriate version and valgus alignment. The permanent 34 mm +3 mm insert was impacted into place. After verifying its locking mechanism, the shoulder was relocated using two finger pressure and again placed through a range of motion with the findings as described above.  The wound was copiously irrigated with sterile saline solution using the jet lavage system before a total of 30 cc of 0.5% Sensorcaine  with epinephrine  was injected into the pericapsular and peri-incisional tissues to help with postoperative analgesia. The subscapularis tendon was reapproximated using #2 FiberWire interrupted sutures. The deltopectoral interval was closed using #0 Vicryl interrupted sutures before the subcutaneous tissues were closed using 2-0 Vicryl interrupted sutures. The skin was closed using staples. A sterile  occlusive dressing was applied to the wound before the arm was placed into a shoulder immobilizer with an abduction pillow. A Polar Care system also was applied to the shoulder. The patient was then transferred back to a hospital bed before being awakened, extubated, and returned to the recovery room in satisfactory condition after tolerating the procedure well.

## 2023-11-19 NOTE — Anesthesia Procedure Notes (Signed)
 Anesthesia Regional Block: Interscalene brachial plexus block   Pre-Anesthetic Checklist: , timeout performed,  Correct Patient, Correct Site, Correct Laterality,  Correct Procedure, Correct Position, site marked,  Risks and benefits discussed,  Surgical consent,  Pre-op evaluation,  At surgeon's request and post-op pain management  Laterality: Left and Upper  Prep: chloraprep       Needles:  Injection technique: Single-shot  Needle Type: Stimiplex     Needle Length: 5cm  Needle Gauge: 22     Additional Needles:   Procedures:,,,, ultrasound used (permanent image in chart),,    Narrative:  Start time: 11/19/2023 12:48 PM End time: 11/19/2023 12:52 PM Injection made incrementally with aspirations every 5 mL.  Performed by: Personally  Anesthesiologist: Vanice Genre, MD  Additional Notes: Functioning IV was confirmed and monitors were applied.  A 50mm 22ga Stimuplex needle was used. Sterile prep and drape,hand hygiene and sterile gloves were used.  Negative aspiration and negative test dose prior to incremental administration of local anesthetic. The patient tolerated the procedure well.

## 2023-11-19 NOTE — Anesthesia Preprocedure Evaluation (Signed)
 Anesthesia Evaluation  Patient identified by MRN, date of birth, ID band Patient awake    Reviewed: Allergy & Precautions, NPO status , Patient's Chart, lab work & pertinent test results  History of Anesthesia Complications Negative for: history of anesthetic complications  Airway Mallampati: I  TM Distance: >3 FB Neck ROM: full    Dental  (+) Implants, Dental Advidsory Given, Teeth Intact   Pulmonary neg pulmonary ROS   Pulmonary exam normal        Cardiovascular hypertension, On Medications (-) angina (-) CAD, (-) Past MI and (-) Cardiac Stents Normal cardiovascular exam(-) dysrhythmias + Valvular Problems/Murmurs      Neuro/Psych neg Seizures  Neuromuscular disease  negative psych ROS   GI/Hepatic Neg liver ROS, hiatal hernia,GERD  Controlled,,  Endo/Other  negative endocrine ROS    Renal/GU negative Renal ROS  negative genitourinary   Musculoskeletal   Abdominal   Peds  Hematology negative hematology ROS (+)   Anesthesia Other Findings Past Medical History: No date: Diaphragmatic hernia No date: Diverticulosis No date: Dysrhythmia No date: GERD (gastroesophageal reflux disease) No date: Heart murmur No date: Heart valve problem No date: History of hiatal hernia No date: Hyperlipidemia No date: Hypertension 2012: Prolapse of uterus 2012: Stress incontinence  Past Surgical History: No date: ABDOMINAL HYSTERECTOMY No date: CARDIAC CATHETERIZATION No date: CHOLECYSTECTOMY 02/27/2017: COLONOSCOPY WITH PROPOFOL ; N/A     Comment:  Procedure: COLONOSCOPY WITH PROPOFOL ;  Surgeon: Luke Salaam, MD;  Location: [facility] ENDOSCOPY;  Service:               Gastroenterology;  Laterality: N/A; 04/10/2015: CYSTOCELE REPAIR; N/A     Comment:  Procedure: ANTERIOR REPAIR (CYSTOCELE);  Surgeon: Alben Alma, MD;  Location: [facility] ORS;  Service: Gynecology;               Laterality: N/A; 2004:  HALLUX VALGUS CORRECTION; Right No date: HAMMER TOE SURGERY; Right No date: JOINT REPLACEMENT 05/27/2017: KNEE ARTHROPLASTY; Right     Comment:  Procedure: COMPUTER ASSISTED TOTAL KNEE ARTHROPLASTY;                Surgeon: Arlyne Lame, MD;  Location: [facility] ORS;                Service: Orthopedics;  Laterality: Right; 04/07/2022: KNEE ARTHROPLASTY; Left     Comment:  Procedure: COMPUTER ASSISTED TOTAL KNEE ARTHROPLASTY;                Surgeon: Arlyne Lame, MD;  Location: [facility] ORS;                Service: Orthopedics;  Laterality: Left; 2004: ROTATOR CUFF REPAIR; Right 04/10/2015: SALPINGOOPHORECTOMY; Bilateral     Comment:  Procedure: SALPINGO OOPHORECTOMY;  Surgeon: Alben Alma, MD;  Location: [facility] ORS;  Service: Gynecology;                Laterality: Bilateral;  vaginal 04/10/2015: VAGINAL HYSTERECTOMY     Comment:  Procedure: HYSTERECTOMY VAGINAL, ;  Surgeon: Alben Alma, MD;  Location: [facility] ORS;  Service: Gynecology;;     Reproductive/Obstetrics negative OB ROS  Anesthesia Physical Anesthesia Plan  ASA: 2  Anesthesia Plan: General   Post-op Pain Management: Regional block*   Induction: Intravenous  PONV Risk Score and Plan: Ondansetron , Dexamethasone  and Treatment may vary due to age or medical condition  Airway Management Planned: Oral ETT  Additional Equipment:   Intra-op Plan:   Post-operative Plan: Extubation in OR  Informed Consent: I have reviewed the patients History and Physical, chart, labs and discussed the procedure including the risks, benefits and alternatives for the proposed anesthesia with the patient or authorized representative who has indicated his/her understanding and acceptance.     Dental Advisory Given  Plan Discussed with: Anesthesiologist, CRNA and Surgeon  Anesthesia Plan Comments: (Patient consented for risks of anesthesia including but not  limited to:  - adverse reactions to medications - risk of airway placement if required - damage to eyes, teeth, lips or other oral mucosa - nerve damage due to positioning  - sore throat or hoarseness - Damage to heart, brain, nerves, lungs, other parts of body or loss of life  Patient voiced understanding.)       Anesthesia Quick Evaluation

## 2023-11-19 NOTE — Discharge Instructions (Addendum)
 Orthopedic discharge instructions: May shower with intact OpSite dressing once nerve block has worn off (Monday), then reapply shoulder immobilizer.  Apply ice frequently to shoulder or use Polar Care device. Resume Celebrex  100 mg daily OR take ibuprofen 600 mg TID with meals for 3-5 days, then as necessary. Take oxycodone  as prescribed when needed.  May supplement with ES Tylenol  if necessary. Keep shoulder immobilizer on at all times except may remove for bathing purposes. Follow-up in 10-14 days or as scheduled.       Interscalene Nerve Block with Exparel    For your surgery you have received an Interscalene Nerve Block with Exparel . Nerve Blocks affect many types of nerves, including nerves that control movement, pain and normal sensation.  You may experience feelings such as numbness, tingling, heaviness, weakness or the inability to move your arm or the feeling or sensation that your arm has fallen asleep. A nerve block with Exparel  can last up to 5 days.  Usually the weakness wears off first.  The tingling and heaviness usually wear off next.  Finally you may start to notice pain.  Keep in mind that this may occur in any order.  Once a nerve block starts to wear off it is usually completely gone within 60 minutes. ISNB may cause mild shortness of breath, a hoarse voice, blurry vision, unequal pupils, or drooping of the face on the same side as the nerve block.  These symptoms will usually resolve with the numbness.  Very rarely the procedure itself can cause mild seizures. If needed, your surgeon will give you a prescription for pain medication.  It will take about 60 minutes for the oral pain medication to become fully effective.  So, it is recommended that you start taking this medication before the nerve block first begins to wear off, or when you first begin to feel discomfort. Take your pain medication only as prescribed.  Pain medication can cause sedation and decrease your breathing  if you take more than you need for the level of pain that you have. Nausea is a common side effect of many pain medications.  You may want to eat something before taking your pain medicine to prevent nausea. After an Interscalene nerve block, you cannot feel pain, pressure or extremes in temperature in the effected arm.  Because your arm is numb it is at an increased risk for injury.  To decrease the possibility of injury, please practice the following:  While you are awake change the position of your arm frequently to prevent too much pressure on any one area for prolonged periods of time.  If you have a cast or tight dressing, check the color or your fingers every couple of hours.  Call your surgeon with the appearance of any discoloration (white or blue). If you are given a sling to wear before you go home, please wear it  at all times until the block has completely worn off.  Do not get up at night without your sling. Please contact [facility] or your surgeon if you do not begin to regain sensation after 7 days from the surgery.  Anesthesia may be contacted by calling the Same Day Surgery Department, Mon. through Fri., 6 am to 4 pm at [phone].   If you experience any other problems or concerns, please contact your surgeon's office. If you experience severe or prolonged shortness of breath go to the nearest emergency department.SHOULDER SLING IMMOBILIZER   VIDEO Slingshot 2 Shoulder Brace Application - YouTube ---[email or web address]/  INSTRUCTIONS While supporting the injured arm, slide the forearm into the sling. Wrap the adjustable shoulder strap around the neck and shoulders and attach the strap end to the sling using  the "alligator strap tab."  Adjust the shoulder strap to the required length. Position the shoulder pad behind the neck. To secure the shoulder pad location (optional), pull the shoulder strap away from the shoulder pad, unfold the hook material  on the top of the pad, then press the shoulder strap back onto the hook material to secure the pad in place. Attach the closure strap across the open top of the sling. Position the strap so that it holds the arm securely in the sling. Next, attach the thumb strap to the open end of the sling between the thumb and fingers. After sling has been fit, it may be easily removed and reapplied using the quick release buckle on shoulder strap. If a neutral pillow or 15 abduction pillow is included, place the pillow at the waistline. Attach the sling to the pillow, lining up hook material on the pillow with the loop on sling. Adjust the waist strap to fit.  If waist strap is too long, cut it to fit. Use the small piece of double sided hook material (located on top of the pillow) to secure the strap end. Place the double sided hook material on the inside of the cut strap end and secure it to the waist strap.     If no pillow is included, attach the waist strap to the sling and adjust to fit.    Washing Instructions: Straps and sling must be removed and cleaned regularly depending on your activity level and perspiration. Hand wash straps and sling in cold water with mild detergent, rinse, air dry

## 2023-11-19 NOTE — Anesthesia Postprocedure Evaluation (Signed)
 Anesthesia Post Note  Patient: Carmelina Chinchilla  Procedure(s) Performed: ARTHROPLASTY, SHOULDER, TOTAL, REVERSE (Left: Shoulder) TENODESIS, BICEPS (Left: Shoulder)  Patient location during evaluation: PACU Anesthesia Type: General Level of consciousness: awake and alert Pain management: pain level controlled Vital Signs Assessment: post-procedure vital signs reviewed and stable Respiratory status: spontaneous breathing, nonlabored ventilation, respiratory function stable and patient connected to nasal cannula oxygen Cardiovascular status: blood pressure returned to baseline and stable Postop Assessment: no apparent nausea or vomiting Anesthetic complications: no  There were no known notable events for this encounter.   Last Vitals:  Vitals:   11/19/23 1630 11/19/23 1641  BP: (!) 96/57 108/61  Pulse: 66 64  Resp: 15 17  Temp:  (!) 36 C  SpO2: 92% 94%    Last Pain:  Vitals:   11/19/23 1641  TempSrc: Tympanic  PainSc: 0-No pain                 Enrique Harvest

## 2023-11-19 NOTE — Anesthesia Procedure Notes (Signed)
 Procedure Name: Intubation Date/Time: 11/19/2023 1:54 PM  Performed by: Jean Michaelis., CRNAPre-anesthesia Checklist: Patient identified, Patient being monitored, Timeout performed, Emergency Drugs available and Suction available Patient Re-evaluated:Patient Re-evaluated prior to induction Oxygen Delivery Method: Circle system utilized Preoxygenation: Pre-oxygenation with 100% oxygen Induction Type: IV induction Ventilation: Mask ventilation without difficulty Laryngoscope Size: 3 and McGrath Grade View: Grade I Tube type: Oral Tube size: 7.0 mm Number of attempts: 1 Airway Equipment and Method: Stylet Placement Confirmation: ETT inserted through vocal cords under direct vision, positive ETCO2 and breath sounds checked- equal and bilateral Secured at: 21 cm Tube secured with: Tape Dental Injury: Teeth and Oropharynx as per pre-operative assessment

## 2023-11-19 NOTE — H&P (Signed)
 History of Present Illness:  Kelly Nguyen is a 68 y.o. female who presents for follow-up of her left shoulder pain secondary to impingement/tendinopathy with a probable large rotator cuff tear. The patient notes little improvement in her symptoms since her last visit 4 weeks ago she continues to complain of moderate pain in the shoulder which she rates at 6/10 on today's visit, and for which she has been taking Celebrex  as necessary with limited benefit. She continues to note moderate severe pain with activities at or above shoulder level, as well as when trying to reach behind her back. She also has pain at night, especially if she tries to sleep on her left side. She denies any reinjury to the shoulder, and denies any numbness or paresthesias down her arm to her hand. Since her last visit, she has undergone an MRI scan of the left shoulder and presents today to review these results.  Current Outpatient Medications:  amoxicillin (AMOXIL) 500 MG capsule  ascorbic acid/vitamin E/biotin (HAIR, SKIN, NAILS WITH BIOTIN ORAL) Take 3 tablets by mouth once daily  celecoxib  (CELEBREX ) 100 MG capsule Take 1 capsule by mouth once daily  celecoxib  (CELEBREX ) 200 MG capsule Take 1 capsule (200 mg total) by mouth once daily  colestipoL  (COLESTID ) 1 gram tablet Take 2 tablets (2 g total) by mouth once daily Take before meals 60 tablet 3  colestipoL  (COLESTID ) 1 gram tablet Take 1 g by mouth once daily  ferrous sulfate  325 (65 FE) MG tablet Take 325 mg by mouth every other day  fluticasone  (FLONASE ) 50 mcg/actuation nasal spray Place 1 spray into both nostrils once daily as needed  fluticasone  propionate (FLONASE ) 50 mcg/actuation nasal spray Place 1 spray into one nostril once daily  Lactobac no.41/Bifidobact no.7 (PROBIOTIC-10 ORAL) Take 1 capsule by mouth once daily  lisinopriL -hydrochlorothiazide  (ZESTORETIC ) 10-12.5 mg tablet Take 1 tablet by mouth once daily  multivitamin with minerals tablet Take 1 tablet by  mouth once daily.  omeprazole  (PRILOSEC) 20 MG DR capsule Take 1 capsule by mouth once daily  sodium, potassium, and magnesium  (SUPREP) oral solution Take 1 Bottle by mouth as directed One kit contains 2 bottles. Take both bottles at the times instructed by your provider. 354 mL 0   Allergies:  Meloxicam Rash  Tramadol  Anxiety, Nausea and Nausea And Vomiting   Past Medical History:  Chicken pox  Enlarged heart  Hypertension   Past Surgical History:  RIGHT ROTATOR CUFF SURGERY Right 2005  CARDIAC CATHERIZATION 2005  RIGHT FOOT SURGERY Right 2005  Right total knee arthroplasty using computer-assisted navigation 05/27/2017 (Dr Aubry Blase)  Left total knee arthroplasty using computer-assisted navigation Left 04/07/2022 (Dr. Aubry Blase)  Colon @ [facility] 11/11/2022 (Tubular adenomas/Repeat 9yr/SMR)  CHOLECYSTECTOMY  HYSTERECTOMY  UPPER AND LOWER ENDOSCOPY   Family History:  Mental illness Mother  Myocardial Infarction (Heart attack) Father  Heart disease Father   Social History:   Socioeconomic History:  Marital status: Married  Spouse name: Joe Gouger  Number of children: 1  Years of education: 13+  Occupational History  Occupation: Retired  Tobacco Use  Smoking status: Never  Smokeless tobacco: Never  Vaping Use  Vaping status: Never Used  Substance and Sexual Activity  Alcohol use: Yes  Alcohol/week: 3.0 standard drinks of alcohol  Types: 3 Glasses of wine per week  Drug use: No  Sexual activity: Yes  Partners: Male   Social Drivers of Health:   Physicist, medical Strain: Low Risk (09/01/2023)  Received from [facility]  Overall Financial  Resource Strain (CARDIA)  Difficulty of Paying Living Expenses: Not hard at all  Food Insecurity: No Food Insecurity (09/01/2023)  Received from [facility]  Hunger Vital Sign  Within the past 12 months, you worried that your food would run out before you got the money to buy more.: Never true  Within the past 12 months, the food you bought  just didn't last and you didn't have money to get more.: Never true  Transportation Needs: No Transportation Needs (09/01/2023)  Received from [facility] - [facility]  Lack of Transportation (Medical): No  Lack of Transportation (Non-Medical): No   Review of Systems:  A comprehensive 14 point ROS was performed, reviewed, and the pertinent orthopaedic findings are documented in the HPI.  Physical Exam: Vitals:  11/16/23 1416  BP: (!) 140/80  Weight: 78.5 kg (173 lb)  Height: 167.6 cm (5' 6)  PainSc: 6  PainLoc: Shoulder   General/Constitutional: The patient appears to be well-nourished, well-developed, and in no acute distress. Neuro/Psych: Normal mood and affect, oriented to person, place and time. Eyes: Non-icteric. Pupils are equal, round, and reactive to light, and exhibit synchronous movement. ENT: Unremarkable. Lymphatic: No palpable adenopathy. Respiratory: Lungs clear to auscultation, Normal chest excursion, No wheezes, and Non-labored breathing Cardiovascular: Regular rate and rhythm. No murmurs. and No edema, swelling or tenderness, except as noted in detailed exam. Integumentary: No impressive skin lesions present, except as noted in detailed exam. Musculoskeletal: Unremarkable, except as noted in detailed exam.  Left shoulder exam: SKIN: Unremarkable. SWELLING: None WARMTH: None LYMPH NODES: No adenopathy palpable CREPITUS: None TENDERNESS: Mild-moderate tenderness along lateral acromion ROM (active):  Forward flexion: 85 degrees Abduction: 75 degrees Internal rotation: L1 ROM (passive):  Forward flexion: 140 degrees Abduction: 130 degrees  ER/IR at 90 abd: 80 degrees/55 degrees  She variances moderate-severe pain with forward flexion and abduction, moderate pain with internal and external rotation at 90 degrees of abduction, and mild pain with internal rotation.  STRENGTH: Forward flexion: 3+-4/5 Abduction: 3+/5 External rotation:  3+-4/5 Internal rotation: 4/5 Pain with RC testing: Moderate-severe pain with resisted forward flexion and abduction, and mild pain with resisted external rotation.  STABILITY: Normal  SPECIAL TESTS: Zoila Hines' test: Moderately positive Speed's test: Negative Capsulitis - pain w/ passive ER: No Crossed arm test: Negative Crank: Not evaluated Anterior apprehension: Negative Posterior apprehension: Not evaluated  She again is neurovascularly intact to the left upper extremity.  X-rays/MRI/Lab data:  A recent MRI scan of the left shoulder is available for review and has been reviewed by myself. By report, the scan demonstrates evidence of a massive full-thickness tear involving the supraspinatus and most of the infraspinatus tendons with retraction to the glenoid. Significant atrophy and fatty infiltration of both muscles also are noted. In addition, there is evidence of moderate glenohumeral cartilage degenerative changes, as well as evidence of a chronic full-thickness tear of the biceps tendon. Both the films and report reviewed by myself and discussed with the patient.  Assessment: 1. Nontraumatic complete tear of left rotator cuff Yes  2. Rotator cuff tendinitis, left  3. Rotator cuff arthropathy, left   Plan: The treatment options were discussed with the patient and her husband. In addition, patient educational materials were provided regarding the diagnosis and treatment options. The patient is quite frustrated by her symptoms and function limitations, and is ready to consider more aggressive treatment options. Therefore, I have again recommended a surgical procedure, specifically a reverse left total shoulder arthroplasty. The procedure was discussed  with the patient, as were the potential risks (including bleeding, infection, nerve and/or blood vessel injury, persistent or recurrent pain, loosening and/or failure of the components, dislocation, need for further surgery, blood clots,  strokes, heart attacks and/or arhythmias, pneumonia, etc.) and benefits. The patient states his/her understanding and wishes to proceed. All of the patient's questions and concerns were answered. She can call any time with further concerns. She will follow up post-surgery, routine.    H&P reviewed and patient re-examined. No changes.

## 2023-11-19 NOTE — Transfer of Care (Signed)
 Immediate Anesthesia Transfer of Care Note  Patient: Kelly Nguyen  Procedure(s) Performed: ARTHROPLASTY, SHOULDER, TOTAL, REVERSE (Left: Shoulder) TENODESIS, BICEPS (Left: Shoulder)  Patient Location: PACU  Anesthesia Type:General  Level of Consciousness: awake and alert   Airway & Oxygen Therapy: Patient Spontanous Breathing  Post-op Assessment: Report given to RN and Post -op Vital signs reviewed and stable  Post vital signs: stable  Last Vitals:  Vitals Value Taken Time  BP 88/51 11/19/23 15:38  Temp    Pulse 71 11/19/23 15:41  Resp 12 11/19/23 15:41  SpO2 91 % 11/19/23 15:41  Vitals shown include unfiled device data.  Last Pain:  Vitals:   11/19/23 1207  TempSrc: Temporal         Complications: No notable events documented.

## 2023-11-20 ENCOUNTER — Encounter: Payer: Self-pay | Admitting: Surgery

## 2023-11-20 DIAGNOSIS — Z471 Aftercare following joint replacement surgery: Secondary | ICD-10-CM | POA: Diagnosis not present

## 2023-11-20 DIAGNOSIS — Z791 Long term (current) use of non-steroidal anti-inflammatories (NSAID): Secondary | ICD-10-CM | POA: Diagnosis not present

## 2023-11-20 DIAGNOSIS — K5900 Constipation, unspecified: Secondary | ICD-10-CM | POA: Diagnosis not present

## 2023-11-20 DIAGNOSIS — I10 Essential (primary) hypertension: Secondary | ICD-10-CM | POA: Diagnosis not present

## 2023-11-20 DIAGNOSIS — Z96653 Presence of artificial knee joint, bilateral: Secondary | ICD-10-CM | POA: Diagnosis not present

## 2023-11-20 DIAGNOSIS — Z96612 Presence of left artificial shoulder joint: Secondary | ICD-10-CM | POA: Diagnosis not present

## 2023-11-20 DIAGNOSIS — R32 Unspecified urinary incontinence: Secondary | ICD-10-CM | POA: Diagnosis not present

## 2023-11-23 DIAGNOSIS — R32 Unspecified urinary incontinence: Secondary | ICD-10-CM | POA: Diagnosis not present

## 2023-11-23 DIAGNOSIS — Z96612 Presence of left artificial shoulder joint: Secondary | ICD-10-CM | POA: Diagnosis not present

## 2023-11-23 DIAGNOSIS — K5900 Constipation, unspecified: Secondary | ICD-10-CM | POA: Diagnosis not present

## 2023-11-23 DIAGNOSIS — I10 Essential (primary) hypertension: Secondary | ICD-10-CM | POA: Diagnosis not present

## 2023-11-23 DIAGNOSIS — Z471 Aftercare following joint replacement surgery: Secondary | ICD-10-CM | POA: Diagnosis not present

## 2023-11-23 DIAGNOSIS — Z96653 Presence of artificial knee joint, bilateral: Secondary | ICD-10-CM | POA: Diagnosis not present

## 2023-11-23 DIAGNOSIS — Z791 Long term (current) use of non-steroidal anti-inflammatories (NSAID): Secondary | ICD-10-CM | POA: Diagnosis not present

## 2023-11-24 DIAGNOSIS — K5900 Constipation, unspecified: Secondary | ICD-10-CM | POA: Diagnosis not present

## 2023-11-24 DIAGNOSIS — Z96612 Presence of left artificial shoulder joint: Secondary | ICD-10-CM | POA: Diagnosis not present

## 2023-11-24 DIAGNOSIS — Z471 Aftercare following joint replacement surgery: Secondary | ICD-10-CM | POA: Diagnosis not present

## 2023-11-24 DIAGNOSIS — I10 Essential (primary) hypertension: Secondary | ICD-10-CM | POA: Diagnosis not present

## 2023-11-24 DIAGNOSIS — Z96653 Presence of artificial knee joint, bilateral: Secondary | ICD-10-CM | POA: Diagnosis not present

## 2023-11-24 DIAGNOSIS — Z791 Long term (current) use of non-steroidal anti-inflammatories (NSAID): Secondary | ICD-10-CM | POA: Diagnosis not present

## 2023-11-24 DIAGNOSIS — R32 Unspecified urinary incontinence: Secondary | ICD-10-CM | POA: Diagnosis not present

## 2023-11-25 ENCOUNTER — Encounter: Payer: Self-pay | Admitting: Surgery

## 2023-11-25 DIAGNOSIS — Z96653 Presence of artificial knee joint, bilateral: Secondary | ICD-10-CM | POA: Diagnosis not present

## 2023-11-25 DIAGNOSIS — I10 Essential (primary) hypertension: Secondary | ICD-10-CM | POA: Diagnosis not present

## 2023-11-25 DIAGNOSIS — K5900 Constipation, unspecified: Secondary | ICD-10-CM | POA: Diagnosis not present

## 2023-11-25 DIAGNOSIS — R32 Unspecified urinary incontinence: Secondary | ICD-10-CM | POA: Diagnosis not present

## 2023-11-25 DIAGNOSIS — Z791 Long term (current) use of non-steroidal anti-inflammatories (NSAID): Secondary | ICD-10-CM | POA: Diagnosis not present

## 2023-11-25 DIAGNOSIS — Z96612 Presence of left artificial shoulder joint: Secondary | ICD-10-CM | POA: Diagnosis not present

## 2023-11-25 DIAGNOSIS — Z471 Aftercare following joint replacement surgery: Secondary | ICD-10-CM | POA: Diagnosis not present

## 2023-11-30 DIAGNOSIS — Z96653 Presence of artificial knee joint, bilateral: Secondary | ICD-10-CM | POA: Diagnosis not present

## 2023-11-30 DIAGNOSIS — K5900 Constipation, unspecified: Secondary | ICD-10-CM | POA: Diagnosis not present

## 2023-11-30 DIAGNOSIS — R32 Unspecified urinary incontinence: Secondary | ICD-10-CM | POA: Diagnosis not present

## 2023-11-30 DIAGNOSIS — Z96612 Presence of left artificial shoulder joint: Secondary | ICD-10-CM | POA: Diagnosis not present

## 2023-11-30 DIAGNOSIS — I10 Essential (primary) hypertension: Secondary | ICD-10-CM | POA: Diagnosis not present

## 2023-11-30 DIAGNOSIS — Z791 Long term (current) use of non-steroidal anti-inflammatories (NSAID): Secondary | ICD-10-CM | POA: Diagnosis not present

## 2023-11-30 DIAGNOSIS — Z471 Aftercare following joint replacement surgery: Secondary | ICD-10-CM | POA: Diagnosis not present

## 2023-12-01 DIAGNOSIS — R32 Unspecified urinary incontinence: Secondary | ICD-10-CM | POA: Diagnosis not present

## 2023-12-01 DIAGNOSIS — Z471 Aftercare following joint replacement surgery: Secondary | ICD-10-CM | POA: Diagnosis not present

## 2023-12-01 DIAGNOSIS — Z791 Long term (current) use of non-steroidal anti-inflammatories (NSAID): Secondary | ICD-10-CM | POA: Diagnosis not present

## 2023-12-01 DIAGNOSIS — K5900 Constipation, unspecified: Secondary | ICD-10-CM | POA: Diagnosis not present

## 2023-12-01 DIAGNOSIS — Z96612 Presence of left artificial shoulder joint: Secondary | ICD-10-CM | POA: Diagnosis not present

## 2023-12-01 DIAGNOSIS — I10 Essential (primary) hypertension: Secondary | ICD-10-CM | POA: Diagnosis not present

## 2023-12-01 DIAGNOSIS — Z96653 Presence of artificial knee joint, bilateral: Secondary | ICD-10-CM | POA: Diagnosis not present

## 2023-12-03 DIAGNOSIS — M25512 Pain in left shoulder: Secondary | ICD-10-CM | POA: Diagnosis not present

## 2023-12-03 DIAGNOSIS — Z96612 Presence of left artificial shoulder joint: Secondary | ICD-10-CM | POA: Diagnosis not present

## 2023-12-03 DIAGNOSIS — R293 Abnormal posture: Secondary | ICD-10-CM | POA: Diagnosis not present

## 2023-12-07 DIAGNOSIS — Z471 Aftercare following joint replacement surgery: Secondary | ICD-10-CM | POA: Diagnosis not present

## 2023-12-08 DIAGNOSIS — R293 Abnormal posture: Secondary | ICD-10-CM | POA: Diagnosis not present

## 2023-12-08 DIAGNOSIS — M25512 Pain in left shoulder: Secondary | ICD-10-CM | POA: Diagnosis not present

## 2023-12-08 DIAGNOSIS — Z96612 Presence of left artificial shoulder joint: Secondary | ICD-10-CM | POA: Diagnosis not present

## 2023-12-10 DIAGNOSIS — M25512 Pain in left shoulder: Secondary | ICD-10-CM | POA: Diagnosis not present

## 2023-12-10 DIAGNOSIS — R293 Abnormal posture: Secondary | ICD-10-CM | POA: Diagnosis not present

## 2023-12-10 DIAGNOSIS — Z96612 Presence of left artificial shoulder joint: Secondary | ICD-10-CM | POA: Diagnosis not present

## 2023-12-15 DIAGNOSIS — Z96612 Presence of left artificial shoulder joint: Secondary | ICD-10-CM | POA: Diagnosis not present

## 2023-12-15 DIAGNOSIS — R293 Abnormal posture: Secondary | ICD-10-CM | POA: Diagnosis not present

## 2023-12-15 DIAGNOSIS — M25512 Pain in left shoulder: Secondary | ICD-10-CM | POA: Diagnosis not present

## 2023-12-16 ENCOUNTER — Other Ambulatory Visit: Payer: Self-pay | Admitting: Family Medicine

## 2023-12-16 DIAGNOSIS — M1990 Unspecified osteoarthritis, unspecified site: Secondary | ICD-10-CM

## 2023-12-17 ENCOUNTER — Other Ambulatory Visit: Payer: Self-pay | Admitting: Internal Medicine

## 2023-12-17 DIAGNOSIS — R293 Abnormal posture: Secondary | ICD-10-CM | POA: Diagnosis not present

## 2023-12-17 DIAGNOSIS — M25512 Pain in left shoulder: Secondary | ICD-10-CM | POA: Diagnosis not present

## 2023-12-17 DIAGNOSIS — Z96612 Presence of left artificial shoulder joint: Secondary | ICD-10-CM | POA: Diagnosis not present

## 2023-12-17 DIAGNOSIS — M1990 Unspecified osteoarthritis, unspecified site: Secondary | ICD-10-CM

## 2023-12-17 MED ORDER — CELECOXIB 100 MG PO CAPS: 100.0000 mg | ORAL_CAPSULE | Freq: Every day | ORAL | 0 refills | Status: DC | PRN

## 2023-12-18 NOTE — Telephone Encounter (Signed)
 Noted

## 2023-12-18 NOTE — Telephone Encounter (Signed)
 Can we get this pt scheduled for a TOC

## 2023-12-22 DIAGNOSIS — R293 Abnormal posture: Secondary | ICD-10-CM | POA: Diagnosis not present

## 2023-12-22 DIAGNOSIS — M25512 Pain in left shoulder: Secondary | ICD-10-CM | POA: Diagnosis not present

## 2023-12-22 DIAGNOSIS — Z96612 Presence of left artificial shoulder joint: Secondary | ICD-10-CM | POA: Diagnosis not present

## 2023-12-24 ENCOUNTER — Encounter: Admitting: Nurse Practitioner

## 2023-12-24 DIAGNOSIS — Z96612 Presence of left artificial shoulder joint: Secondary | ICD-10-CM | POA: Diagnosis not present

## 2023-12-24 DIAGNOSIS — R293 Abnormal posture: Secondary | ICD-10-CM | POA: Diagnosis not present

## 2023-12-24 DIAGNOSIS — M25512 Pain in left shoulder: Secondary | ICD-10-CM | POA: Diagnosis not present

## 2023-12-29 DIAGNOSIS — Z96612 Presence of left artificial shoulder joint: Secondary | ICD-10-CM | POA: Diagnosis not present

## 2023-12-29 DIAGNOSIS — R293 Abnormal posture: Secondary | ICD-10-CM | POA: Diagnosis not present

## 2023-12-29 DIAGNOSIS — M25512 Pain in left shoulder: Secondary | ICD-10-CM | POA: Diagnosis not present

## 2023-12-31 DIAGNOSIS — R293 Abnormal posture: Secondary | ICD-10-CM | POA: Diagnosis not present

## 2023-12-31 DIAGNOSIS — Z96612 Presence of left artificial shoulder joint: Secondary | ICD-10-CM | POA: Diagnosis not present

## 2023-12-31 DIAGNOSIS — M25512 Pain in left shoulder: Secondary | ICD-10-CM | POA: Diagnosis not present

## 2024-01-01 DIAGNOSIS — Z96612 Presence of left artificial shoulder joint: Secondary | ICD-10-CM | POA: Diagnosis not present

## 2024-01-07 DIAGNOSIS — Z96612 Presence of left artificial shoulder joint: Secondary | ICD-10-CM | POA: Diagnosis not present

## 2024-01-07 DIAGNOSIS — M25512 Pain in left shoulder: Secondary | ICD-10-CM | POA: Diagnosis not present

## 2024-01-07 DIAGNOSIS — R293 Abnormal posture: Secondary | ICD-10-CM | POA: Diagnosis not present

## 2024-01-11 DIAGNOSIS — M25512 Pain in left shoulder: Secondary | ICD-10-CM | POA: Diagnosis not present

## 2024-01-11 DIAGNOSIS — R293 Abnormal posture: Secondary | ICD-10-CM | POA: Diagnosis not present

## 2024-01-11 DIAGNOSIS — Z96612 Presence of left artificial shoulder joint: Secondary | ICD-10-CM | POA: Diagnosis not present

## 2024-01-13 DIAGNOSIS — R293 Abnormal posture: Secondary | ICD-10-CM | POA: Diagnosis not present

## 2024-01-13 DIAGNOSIS — M25512 Pain in left shoulder: Secondary | ICD-10-CM | POA: Diagnosis not present

## 2024-01-13 DIAGNOSIS — Z96612 Presence of left artificial shoulder joint: Secondary | ICD-10-CM | POA: Diagnosis not present

## 2024-01-18 DIAGNOSIS — M25512 Pain in left shoulder: Secondary | ICD-10-CM | POA: Diagnosis not present

## 2024-01-18 DIAGNOSIS — Z96612 Presence of left artificial shoulder joint: Secondary | ICD-10-CM | POA: Diagnosis not present

## 2024-01-18 DIAGNOSIS — R293 Abnormal posture: Secondary | ICD-10-CM | POA: Diagnosis not present

## 2024-01-25 DIAGNOSIS — M25512 Pain in left shoulder: Secondary | ICD-10-CM | POA: Diagnosis not present

## 2024-01-25 DIAGNOSIS — R293 Abnormal posture: Secondary | ICD-10-CM | POA: Diagnosis not present

## 2024-01-25 DIAGNOSIS — Z96612 Presence of left artificial shoulder joint: Secondary | ICD-10-CM | POA: Diagnosis not present

## 2024-01-28 DIAGNOSIS — R293 Abnormal posture: Secondary | ICD-10-CM | POA: Diagnosis not present

## 2024-01-28 DIAGNOSIS — M25512 Pain in left shoulder: Secondary | ICD-10-CM | POA: Diagnosis not present

## 2024-01-28 DIAGNOSIS — Z96612 Presence of left artificial shoulder joint: Secondary | ICD-10-CM | POA: Diagnosis not present

## 2024-02-04 ENCOUNTER — Other Ambulatory Visit: Payer: Self-pay

## 2024-02-04 DIAGNOSIS — I10 Essential (primary) hypertension: Secondary | ICD-10-CM

## 2024-02-04 MED ORDER — LISINOPRIL-HYDROCHLOROTHIAZIDE 10-12.5 MG PO TABS: 1.0000 | ORAL_TABLET | Freq: Every day | ORAL | 1 refills | Status: AC

## 2024-02-11 DIAGNOSIS — M25512 Pain in left shoulder: Secondary | ICD-10-CM | POA: Diagnosis not present

## 2024-02-11 DIAGNOSIS — Z96612 Presence of left artificial shoulder joint: Secondary | ICD-10-CM | POA: Diagnosis not present

## 2024-02-11 DIAGNOSIS — R293 Abnormal posture: Secondary | ICD-10-CM | POA: Diagnosis not present

## 2024-02-18 DIAGNOSIS — M25512 Pain in left shoulder: Secondary | ICD-10-CM | POA: Diagnosis not present

## 2024-02-18 DIAGNOSIS — Z96612 Presence of left artificial shoulder joint: Secondary | ICD-10-CM | POA: Diagnosis not present

## 2024-02-18 DIAGNOSIS — R293 Abnormal posture: Secondary | ICD-10-CM | POA: Diagnosis not present

## 2024-02-25 DIAGNOSIS — Z96612 Presence of left artificial shoulder joint: Secondary | ICD-10-CM | POA: Diagnosis not present

## 2024-02-25 DIAGNOSIS — M25512 Pain in left shoulder: Secondary | ICD-10-CM | POA: Diagnosis not present

## 2024-02-25 DIAGNOSIS — R293 Abnormal posture: Secondary | ICD-10-CM | POA: Diagnosis not present

## 2024-03-13 ENCOUNTER — Other Ambulatory Visit: Payer: Self-pay | Admitting: Internal Medicine

## 2024-03-13 DIAGNOSIS — M1990 Unspecified osteoarthritis, unspecified site: Secondary | ICD-10-CM

## 2024-03-15 DIAGNOSIS — Z1331 Encounter for screening for depression: Secondary | ICD-10-CM | POA: Diagnosis not present

## 2024-03-15 DIAGNOSIS — R7303 Prediabetes: Secondary | ICD-10-CM | POA: Diagnosis not present

## 2024-03-15 DIAGNOSIS — M12812 Other specific arthropathies, not elsewhere classified, left shoulder: Secondary | ICD-10-CM | POA: Diagnosis not present

## 2024-03-15 DIAGNOSIS — I10 Essential (primary) hypertension: Secondary | ICD-10-CM | POA: Diagnosis not present

## 2024-03-15 DIAGNOSIS — E782 Mixed hyperlipidemia: Secondary | ICD-10-CM | POA: Diagnosis not present

## 2024-03-22 DIAGNOSIS — K080 Exfoliation of teeth due to systemic causes: Secondary | ICD-10-CM | POA: Diagnosis not present

## 2024-03-24 DIAGNOSIS — H43813 Vitreous degeneration, bilateral: Secondary | ICD-10-CM | POA: Diagnosis not present

## 2024-03-24 DIAGNOSIS — H18513 Endothelial corneal dystrophy, bilateral: Secondary | ICD-10-CM | POA: Diagnosis not present

## 2024-03-24 DIAGNOSIS — H2513 Age-related nuclear cataract, bilateral: Secondary | ICD-10-CM | POA: Diagnosis not present

## 2024-03-25 DIAGNOSIS — H5213 Myopia, bilateral: Secondary | ICD-10-CM | POA: Diagnosis not present

## 2024-04-01 ENCOUNTER — Ambulatory Visit: Admitting: Anesthesiology

## 2024-04-01 ENCOUNTER — Encounter: Admission: RE | Disposition: A | Payer: Self-pay | Source: Home / Self Care | Attending: Gastroenterology

## 2024-04-01 ENCOUNTER — Ambulatory Visit
Admission: RE | Admit: 2024-04-01 | Discharge: 2024-04-01 | Disposition: A | Discharge status: Home / Self Care | Attending: Gastroenterology | Admitting: Gastroenterology

## 2024-04-01 ENCOUNTER — Encounter: Payer: Self-pay | Admitting: Gastroenterology

## 2024-04-01 DIAGNOSIS — Z79899 Other long term (current) drug therapy: Secondary | ICD-10-CM | POA: Insufficient documentation

## 2024-04-01 DIAGNOSIS — K219 Gastro-esophageal reflux disease without esophagitis: Secondary | ICD-10-CM | POA: Insufficient documentation

## 2024-04-01 DIAGNOSIS — Z860101 Personal history of adenomatous and serrated colon polyps: Secondary | ICD-10-CM | POA: Diagnosis not present

## 2024-04-01 DIAGNOSIS — Z1211 Encounter for screening for malignant neoplasm of colon: Secondary | ICD-10-CM | POA: Insufficient documentation

## 2024-04-01 DIAGNOSIS — I10 Essential (primary) hypertension: Secondary | ICD-10-CM | POA: Insufficient documentation

## 2024-04-01 DIAGNOSIS — K641 Second degree hemorrhoids: Secondary | ICD-10-CM | POA: Insufficient documentation

## 2024-04-01 DIAGNOSIS — D120 Benign neoplasm of cecum: Secondary | ICD-10-CM | POA: Insufficient documentation

## 2024-04-01 DIAGNOSIS — D125 Benign neoplasm of sigmoid colon: Secondary | ICD-10-CM | POA: Diagnosis not present

## 2024-04-01 DIAGNOSIS — K5730 Diverticulosis of large intestine without perforation or abscess without bleeding: Secondary | ICD-10-CM | POA: Diagnosis not present

## 2024-04-01 DIAGNOSIS — Z9049 Acquired absence of other specified parts of digestive tract: Secondary | ICD-10-CM | POA: Diagnosis not present

## 2024-04-01 DIAGNOSIS — D123 Benign neoplasm of transverse colon: Secondary | ICD-10-CM | POA: Diagnosis not present

## 2024-04-01 DIAGNOSIS — K635 Polyp of colon: Secondary | ICD-10-CM | POA: Diagnosis not present

## 2024-04-01 DIAGNOSIS — K449 Diaphragmatic hernia without obstruction or gangrene: Secondary | ICD-10-CM | POA: Diagnosis not present

## 2024-04-01 DIAGNOSIS — Z860100 Personal history of colon polyps, unspecified: Secondary | ICD-10-CM | POA: Diagnosis not present

## 2024-04-01 DIAGNOSIS — K649 Unspecified hemorrhoids: Secondary | ICD-10-CM | POA: Diagnosis not present

## 2024-04-01 HISTORY — PX: POLYPECTOMY: SHX149

## 2024-04-01 HISTORY — PX: COLONOSCOPY: SHX5424

## 2024-04-01 SURGERY — COLONOSCOPY
Anesthesia: General

## 2024-04-01 MED ORDER — EPHEDRINE SULFATE-NACL 50-0.9 MG/10ML-% IV SOSY
PREFILLED_SYRINGE | INTRAVENOUS | Status: DC | PRN
  Administered 2024-04-01 (×2): 10 mg via INTRAVENOUS

## 2024-04-01 MED ORDER — LIDOCAINE HCL (CARDIAC) PF 100 MG/5ML IV SOSY
PREFILLED_SYRINGE | INTRAVENOUS | Status: DC | PRN
  Administered 2024-04-01: 60 mg via INTRAVENOUS

## 2024-04-01 MED ORDER — PROPOFOL 500 MG/50ML IV EMUL
INTRAVENOUS | Status: DC | PRN
  Administered 2024-04-01: 150 ug/kg/min via INTRAVENOUS

## 2024-04-01 MED ORDER — DEXMEDETOMIDINE HCL IN NACL 80 MCG/20ML IV SOLN
INTRAVENOUS | Status: DC | PRN
  Administered 2024-04-01: 8 ug via INTRAVENOUS

## 2024-04-01 MED ORDER — PROPOFOL 10 MG/ML IV BOLUS
INTRAVENOUS | Status: DC | PRN
  Administered 2024-04-01: 100 mg via INTRAVENOUS

## 2024-04-01 MED ORDER — SODIUM CHLORIDE 0.9 % IV SOLN: INTRAVENOUS | Status: DC

## 2024-04-01 NOTE — Interval H&P Note (Signed)
 History and Physical Interval Note: Preprocedure H&P from 04/01/24  was reviewed and there was no interval change after seeing and examining the patient.  Written consent was obtained from the patient after discussion of risks, benefits, and alternatives. Patient has consented to proceed with Colonoscopy with possible intervention   04/01/2024 10:07 AM  Kelly Nguyen  has presented today for surgery, with the diagnosis of Personal history of adenomatous and serrated colon polyps (Z86.0101).  The various methods of treatment have been discussed with the patient and family. After consideration of risks, benefits and other options for treatment, the patient has consented to  Procedure(s): COLONOSCOPY (N/A) as a surgical intervention.  The patient's history has been reviewed, patient examined, no change in status, stable for surgery.  I have reviewed the patient's chart and labs.  Questions were answered to the patient's satisfaction.     Elspeth Ozell Jungling

## 2024-04-01 NOTE — Transfer of Care (Signed)
 Immediate Anesthesia Transfer of Care Note  Patient: Kelly Nguyen  Procedure(s) Performed: COLONOSCOPY POLYPECTOMY, INTESTINE  Patient Location: PACU  Anesthesia Type:General  Level of Consciousness: drowsy  Airway & Oxygen Therapy: Patient Spontanous Breathing and Patient connected to nasal cannula oxygen  Post-op Assessment: Report given to RN, Post -op Vital signs reviewed and stable, and Patient moving all extremities X 4  Post vital signs: Reviewed and stable  Last Vitals:  Vitals Value Taken Time  BP 99/62 04/01/24 10:45  Temp    Pulse 70 04/01/24 10:45  Resp 14 04/01/24 10:45  SpO2 95 % 04/01/24 10:45  Vitals shown include unfiled device data.  Last Pain:  Vitals:   04/01/24 1045  TempSrc:   PainSc: Asleep         Complications: No notable events documented.

## 2024-04-01 NOTE — Op Note (Signed)
 [facility] Patient Name: Kelly Nguyen Procedure Date: 04/01/2024 10:02 AM MRN: [id number] Account #: [id number] Date of Birth: September 09, 1955 Admit Type: Outpatient Age: 68 Room: [facility] ENDO ROOM 1 Gender: Female Note Status: Finalized Instrument Name: Colon Scope [id number] Procedure:             Colonoscopy Indications:           High risk colon cancer surveillance: Personal history                         of colonic polyps Providers:             Elspeth Ozell Onita ROSALEA, DO Referring MD:          Edsel Pepper (Referring MD) Medicines:             Monitored Anesthesia Care Complications:         No immediate complications. Estimated blood loss:                         Minimal. Procedure:             Pre-Anesthesia Assessment:                        - Prior to the procedure, a History and Physical was                         performed, and patient medications and allergies were                         reviewed. The patient is competent. The risks and                         benefits of the procedure and the sedation options and                         risks were discussed with the patient. All questions                         were answered and informed consent was obtained.                         Patient identification and proposed procedure were                         verified by the physician, the nurse, the anesthetist                         and the technician in the endoscopy suite. Mental                         Status Examination: alert and oriented. Airway                         Examination: normal oropharyngeal airway and neck                         mobility. Respiratory Examination: clear to  auscultation. CV Examination: RRR, no murmurs, no S3                         or S4. Prophylactic Antibiotics: The patient does not                         require prophylactic antibiotics. Prior                          Anticoagulants: The patient has taken no anticoagulant                         or antiplatelet agents. ASA Grade Assessment: II - A                         patient with mild systemic disease. After reviewing                         the risks and benefits, the patient was deemed in                         satisfactory condition to undergo the procedure. The                         anesthesia plan was to use monitored anesthesia care                         (MAC). Immediately prior to administration of                         medications, the patient was re-assessed for adequacy                         to receive sedatives. The heart rate, respiratory                         rate, oxygen saturations, blood pressure, adequacy of                         pulmonary ventilation, and response to care were                         monitored throughout the procedure. The physical                         status of the patient was re-assessed after the                         procedure.                        After obtaining informed consent, the colonoscope was                         passed under direct vision. Throughout the procedure,                         the patient's blood pressure, pulse, and oxygen  saturations were monitored continuously. The                         Colonoscope was introduced through the anus and                         advanced to the the terminal ileum, with                         identification of the appendiceal orifice and IC                         valve. The colonoscopy was performed without                         difficulty. The patient tolerated the procedure well.                         The quality of the bowel preparation was evaluated                         using the BBPS [facility] Bowel Preparation Scale) with                         scores of: Right Colon = 3 (entire mucosa seen well                         with no residual staining,  small fragments of stool or                         opaque liquid), Transverse Colon = 3 (entire mucosa                         seen well with no residual staining, small fragments                         of stool or opaque liquid) and Left Colon = 2 (minor                         amount of residual staining, small fragments of stool                         and/or opaque liquid, but mucosa seen well). The total                         BBPS score equals 8. The quality of the bowel                         preparation was excellent. The terminal ileum,                         ileocecal valve, appendiceal orifice, and rectum were                         photographed. Findings:      Hemorrhoids were found on perianal exam.      The digital rectal exam was normal. Pertinent negatives include  normal       sphincter tone.      The terminal ileum appeared normal. Estimated blood loss: none.      Retroflexion in the right colon was performed.      Multiple small-mouthed diverticula were found in the left colon.       Estimated blood loss: none.      Non-bleeding internal hemorrhoids were found during retroflexion and       during perianal exam. The hemorrhoids were Grade II (internal       hemorrhoids that prolapse but reduce spontaneously). Estimated blood       loss: none.      Five sessile polyps were found in the sigmoid colon (1), transverse       colon (3) and cecum (1). The polyps were 1 to 3 mm in size. These polyps       were removed with a jumbo cold forceps. Resection and retrieval were       complete. Estimated blood loss was minimal.      The exam was otherwise without abnormality on direct and retroflexion       views. Impression:            - Hemorrhoids found on perianal exam.                        - The examined portion of the ileum was normal.                        - Diverticulosis in the left colon.                        - Non-bleeding internal hemorrhoids.                         - Five 1 to 3 mm polyps in the sigmoid colon, in the                         transverse colon and in the cecum, removed with a                         jumbo cold forceps. Resected and retrieved.                        - The examination was otherwise normal on direct and                         retroflexion views. Recommendation:        - Patient has a contact number available for                         emergencies. The signs and symptoms of potential                         delayed complications were discussed with the patient.                         Return to normal activities tomorrow. Written                         discharge instructions were provided to the  patient.                        - Discharge patient to home.                        - Resume previous diet.                        - Continue present medications.                        - Await pathology results.                        - Repeat colonoscopy for surveillance based on                         pathology results.                        - Return to referring physician as previously                         scheduled.                        - The findings and recommendations were discussed with                         the patient. Procedure Code(s):     --- Professional ---                        [phone], Colonoscopy, flexible; with biopsy, single or                         multiple Diagnosis Code(s):     --- Professional ---                        Z86.010, Personal history of colonic polyps                        K64.1, Second degree hemorrhoids                        D12.5, Benign neoplasm of sigmoid colon                        D12.3, Benign neoplasm of transverse colon (hepatic                         flexure or splenic flexure)                        D12.0, Benign neoplasm of cecum                        K57.30, Diverticulosis of large intestine without                         perforation or abscess without  bleeding CPT copyright 2022 American Medical Association. All rights reserved. The codes documented in this report are preliminary and upon  coder review may  be revised to meet current compliance requirements. Attending Participation:      I personally performed the entire procedure. Elspeth Jungling, DO Elspeth Ozell Jungling DO, DO 04/01/2024 10:44:40 AM This report has been signed electronically. Number of Addenda: 0 Note Initiated On: 04/01/2024 10:02 AM Scope Withdrawal Time: 0 hours 16 minutes 10 seconds  Total Procedure Duration: 0 hours 19 minutes 54 seconds  Estimated Blood Loss:  Estimated blood loss was minimal.      [facility]

## 2024-04-01 NOTE — Anesthesia Preprocedure Evaluation (Signed)
 Anesthesia Evaluation  Patient identified by MRN, date of birth, ID band Patient awake    Reviewed: Allergy & Precautions, NPO status , Patient's Chart, lab work & pertinent test results  Airway Mallampati: II  TM Distance: >3 FB Neck ROM: full    Dental  (+) Teeth Intact   Pulmonary neg pulmonary ROS   Pulmonary exam normal        Cardiovascular Exercise Tolerance: Good hypertension, Pt. on medications negative cardio ROS Normal cardiovascular exam Rhythm:Regular Rate:Normal     Neuro/Psych negative neurological ROS  negative psych ROS   GI/Hepatic negative GI ROS, Neg liver ROS, hiatal hernia,GERD  Medicated,,  Endo/Other  negative endocrine ROS    Renal/GU negative Renal ROS  negative genitourinary   Musculoskeletal   Abdominal Normal abdominal exam  (+)   Peds negative pediatric ROS (+)  Hematology negative hematology ROS (+)   Anesthesia Other Findings Past Medical History: No date: Anemia No date: Diaphragmatic hernia No date: Diverticulosis No date: Dysrhythmia No date: GERD (gastroesophageal reflux disease) No date: Heart murmur No date: Heart valve problem No date: History of hiatal hernia No date: Hyperlipidemia No date: Hypertension No date: Nontraumatic complete tear of left rotator cuff 2012: Prolapse of uterus No date: Rotator cuff tendinitis, left 2012: Stress incontinence  Past Surgical History: No date: ABDOMINAL HYSTERECTOMY 11/19/2023: BICEPT TENODESIS; Left     Comment:  Procedure: TENODESIS, BICEPS;  Surgeon: Edie Norleen PARAS,               MD;  Location: [facility] ORS;  Service: Orthopedics;                Laterality: Left; No date: CARDIAC CATHETERIZATION No date: CHOLECYSTECTOMY 02/27/2017: COLONOSCOPY WITH PROPOFOL ; N/A     Comment:  Procedure: COLONOSCOPY WITH PROPOFOL ;  Surgeon: Therisa Bi, MD;  Location: [facility] ENDOSCOPY;  Service:               Gastroenterology;   Laterality: N/A; 11/10/2022: COLONOSCOPY WITH PROPOFOL ; N/A     Comment:  Procedure: COLONOSCOPY WITH PROPOFOL ;  Surgeon: Onita Elspeth Sharper, DO;  Location: [facility] ENDOSCOPY;  Service:               Gastroenterology;  Laterality: N/A; 04/10/2015: CYSTOCELE REPAIR; N/A     Comment:  Procedure: ANTERIOR REPAIR (CYSTOCELE);  Surgeon: Lamar SHAUNNA Lesches, MD;  Location: [facility] ORS;  Service: Gynecology;               Laterality: N/A; 2004: HALLUX VALGUS CORRECTION; Right No date: HAMMER TOE SURGERY; Right No date: JOINT REPLACEMENT 05/27/2017: KNEE ARTHROPLASTY; Right     Comment:  Procedure: COMPUTER ASSISTED TOTAL KNEE ARTHROPLASTY;                Surgeon: Mardee Lynwood SHAUNNA, MD;  Location: [facility] ORS;                Service: Orthopedics;  Laterality: Right; 04/07/2022: KNEE ARTHROPLASTY; Left     Comment:  Procedure: COMPUTER ASSISTED TOTAL KNEE ARTHROPLASTY;                Surgeon: Mardee Lynwood SHAUNNA, MD;  Location: [facility] ORS;                Service: Orthopedics;  Laterality: Left; 11/19/2023: REVERSE SHOULDER ARTHROPLASTY; Left     Comment:  Procedure: ARTHROPLASTY, SHOULDER, TOTAL, REVERSE;                Surgeon: Edie Norleen PARAS, MD;  Location: [facility] ORS;                Service: Orthopedics;  Laterality: Left; 2004: ROTATOR CUFF REPAIR; Right 04/10/2015: SALPINGOOPHORECTOMY; Bilateral     Comment:  Procedure: SALPINGO OOPHORECTOMY;  Surgeon: Lamar SHAUNNA Lesches, MD;  Location: [facility] ORS;  Service: Gynecology;                Laterality: Bilateral;  vaginal 04/10/2015: VAGINAL HYSTERECTOMY     Comment:  Procedure: HYSTERECTOMY VAGINAL, ;  Surgeon: Lamar SHAUNNA Lesches, MD;  Location: [facility] ORS;  Service: Gynecology;;  BMI    Body Mass Index: 27.12 kg/m      Reproductive/Obstetrics negative OB ROS                              Anesthesia Physical Anesthesia Plan  ASA: 2  Anesthesia Plan: General   Post-op Pain  Management:    Induction: Intravenous  PONV Risk Score and Plan: Propofol  infusion and TIVA  Airway Management Planned: Natural Airway and Nasal Cannula  Additional Equipment:   Intra-op Plan:   Post-operative Plan:   Informed Consent: I have reviewed the patients History and Physical, chart, labs and discussed the procedure including the risks, benefits and alternatives for the proposed anesthesia with the patient or authorized representative who has indicated his/her understanding and acceptance.     Dental Advisory Given  Plan Discussed with: Anesthesiologist, CRNA and Surgeon  Anesthesia Plan Comments:         Anesthesia Quick Evaluation

## 2024-04-01 NOTE — Anesthesia Postprocedure Evaluation (Signed)
 Anesthesia Post Note  Patient: Kelly Nguyen  Procedure(s) Performed: COLONOSCOPY POLYPECTOMY, INTESTINE  Patient location during evaluation: PACU Anesthesia Type: General Level of consciousness: awake and awake and alert Pain management: satisfactory to patient Respiratory status: spontaneous breathing Cardiovascular status: stable Anesthetic complications: no   No notable events documented.   Last Vitals:  Vitals:   04/01/24 1105 04/01/24 1110  BP: (!) 98/52 107/62  Pulse: 61 60  Resp: 13 19  Temp:    SpO2: 96% 100%    Last Pain:  Vitals:   04/01/24 1110  TempSrc:   PainSc: 0-No pain                 VAN STAVEREN,Marcellas Marchant

## 2024-04-01 NOTE — H&P (Signed)
 Pre-Procedure H&P   Patient ID: Kelly Nguyen is a 68 y.o. female.  Gastroenterology Provider: Elspeth Ozell Jungling, DO  Referring Provider: Edsel Pepper, PA PCP: Pepper Edsel, PA-C  Date: 04/01/2024  HPI [name] is a 68 y.o. female who presents today for Colonoscopy for Personal history of colon polyps .  11/2022- CSY - 9x TA including one >34mm; bx negative for microscopic colitis CSY 01/2017- TA x2  Daily BM w/o melena/hematochezia  12.4/92 plt 254K Cr 0.94  S/p cholecystectomy and hysto   Past Medical History:  Diagnosis Date   Anemia    Diaphragmatic hernia    Diverticulosis    Dysrhythmia    GERD (gastroesophageal reflux disease)    Heart murmur    Heart valve problem    History of hiatal hernia    Hyperlipidemia    Hypertension    Nontraumatic complete tear of left rotator cuff    Prolapse of uterus 2012   Rotator cuff tendinitis, left    Stress incontinence 2012    Past Surgical History:  Procedure Laterality Date   ABDOMINAL HYSTERECTOMY     BICEPT TENODESIS Left 11/19/2023   Procedure: TENODESIS, BICEPS;  Surgeon: Edie Norleen PARAS, MD;  Location: [facility] ORS;  Service: Orthopedics;  Laterality: Left;   CARDIAC CATHETERIZATION     CHOLECYSTECTOMY     COLONOSCOPY WITH PROPOFOL  N/A 02/27/2017   Procedure: COLONOSCOPY WITH PROPOFOL ;  Surgeon: Therisa Bi, MD;  Location: [facility] ENDOSCOPY;  Service: Gastroenterology;  Laterality: N/A;   COLONOSCOPY WITH PROPOFOL  N/A 11/10/2022   Procedure: COLONOSCOPY WITH PROPOFOL ;  Surgeon: Jungling Elspeth Ozell, DO;  Location: [facility] ENDOSCOPY;  Service: Gastroenterology;  Laterality: N/A;   CYSTOCELE REPAIR N/A 04/10/2015   Procedure: ANTERIOR REPAIR (CYSTOCELE);  Surgeon: Lamar SHAUNNA Lesches, MD;  Location: [facility] ORS;  Service: Gynecology;  Laterality: N/A;   HALLUX VALGUS CORRECTION Right 2004   HAMMER TOE SURGERY Right    JOINT REPLACEMENT     KNEE ARTHROPLASTY Right 05/27/2017   Procedure: COMPUTER ASSISTED TOTAL  KNEE ARTHROPLASTY;  Surgeon: Mardee Lynwood SHAUNNA, MD;  Location: [facility] ORS;  Service: Orthopedics;  Laterality: Right;   KNEE ARTHROPLASTY Left 04/07/2022   Procedure: COMPUTER ASSISTED TOTAL KNEE ARTHROPLASTY;  Surgeon: Mardee Lynwood SHAUNNA, MD;  Location: [facility] ORS;  Service: Orthopedics;  Laterality: Left;   REVERSE SHOULDER ARTHROPLASTY Left 11/19/2023   Procedure: ARTHROPLASTY, SHOULDER, TOTAL, REVERSE;  Surgeon: Edie Norleen PARAS, MD;  Location: [facility] ORS;  Service: Orthopedics;  Laterality: Left;   ROTATOR CUFF REPAIR Right 2004   SALPINGOOPHORECTOMY Bilateral 04/10/2015   Procedure: SALPINGO OOPHORECTOMY;  Surgeon: Lamar SHAUNNA Lesches, MD;  Location: [facility] ORS;  Service: Gynecology;  Laterality: Bilateral;  vaginal   VAGINAL HYSTERECTOMY  04/10/2015   Procedure: HYSTERECTOMY VAGINAL, ;  Surgeon: Lamar SHAUNNA Lesches, MD;  Location: [facility] ORS;  Service: Gynecology;;    Family History No h/o GI disease or malignancy  Review of Systems  Constitutional:  Negative for activity change, appetite change, chills, diaphoresis, fatigue, fever and unexpected weight change.  HENT:  Negative for trouble swallowing and voice change.   Respiratory:  Negative for shortness of breath and wheezing.   Cardiovascular:  Negative for chest pain, palpitations and leg swelling.  Gastrointestinal:  Negative for abdominal distention, abdominal pain, anal bleeding, blood in stool, constipation, diarrhea, nausea, rectal pain and vomiting.  Musculoskeletal:  Negative for arthralgias and myalgias.  Skin:  Negative for color change and pallor.  Neurological:  Negative for dizziness, syncope and weakness.  Psychiatric/Behavioral:  Negative for confusion.   All other systems reviewed and are negative.    Medications No current facility-administered medications on file prior to encounter.   Current Outpatient Medications on File Prior to Encounter  Medication Sig Dispense Refill   celecoxib  (CELEBREX ) 100 MG capsule TAKE 1 CAPSULE BY  MOUTH DAILY AS NEEDED FOR MILD PAIN (PAIN SCORE 1-3) OR MODERATE PAIN (PAIN SCORE 4-6) 90 capsule 0   lisinopril -hydrochlorothiazide  (ZESTORETIC ) 10-12.5 MG tablet Take 1 tablet by mouth daily. 90 tablet 1   chlorhexidine  (HIBICLENS ) 4 % external liquid Apply 15 mLs (1 Application total) topically as directed for 30 doses. Use as directed daily for 5 days every other week for 6 weeks. 946 mL 1   colestipol  (COLESTID ) 1 g tablet Take 1 tablet (1 g total) by mouth daily. (Patient taking differently: Take 1 g by mouth 2 (two) times daily.) 30 tablet 3   Iron , Ferrous Sulfate , 325 (65 Fe) MG TABS Take 325 mg by mouth every other day. Furasol (Patient not taking: Reported on 04/01/2024)     lactobacillus acidophilus (BACID) TABS tablet Take 1 tablet by mouth daily. Probiotic     MAGNESIUM  GLYCINATE PO Take 240 mg by mouth daily.     Multiple Vitamins-Minerals (HAIR SKIN & NAILS) TABS Take 1 tablet by mouth daily.     Multiple Vitamins-Minerals (MULTIVITAMIN WITH MINERALS) tablet Take 1 tablet by mouth daily.     oxyCODONE  (ROXICODONE ) 5 MG immediate release tablet Take 1-2 tablets (5-10 mg total) by mouth every 4 (four) hours as needed for moderate pain (pain score 4-6) or severe pain (pain score 7-10). (Patient not taking: Reported on 04/01/2024) 40 tablet 0   Vitamin D -Vitamin K (VITAMIN K2-VITAMIN D3 PO) Take 1 tablet by mouth daily.      Pertinent medications related to GI and procedure were reviewed by me with the patient prior to the procedure   Current Facility-Administered Medications:    0.9 %  sodium chloride  infusion, , Intravenous, Continuous, Onita Elspeth Sharper, DO, Last Rate: 20 mL/hr at 04/01/24 0926, New Bag at 04/01/24 9073  sodium chloride  20 mL/hr at 04/01/24 9073       Allergies  Allergen Reactions   Acyclovir And Related     Patient not familiar with this medication   Meloxicam Rash   Tramadol  Nausea And Vomiting, Anxiety and Nausea Only   Allergies were reviewed by me  prior to the procedure  Objective   Body mass index is 27.12 kg/m. Vitals:   04/01/24 0916  BP: 127/89  Pulse: 64  Resp: 16  Temp: (!) 96.8 F (36 C)  TempSrc: Temporal  SpO2: 100%  Weight: 76.2 kg     Physical Exam Vitals and nursing note reviewed.  Constitutional:      General: She is not in acute distress.    Appearance: Normal appearance. She is not ill-appearing, toxic-appearing or diaphoretic.  HENT:     Head: Normocephalic and atraumatic.     Nose: Nose normal.     Mouth/Throat:     Mouth: Mucous membranes are moist.     Pharynx: Oropharynx is clear.  Eyes:     General: No scleral icterus.    Extraocular Movements: Extraocular movements intact.  Cardiovascular:     Rate and Rhythm: Normal rate and regular rhythm.     Heart sounds: Normal heart sounds. No murmur heard.    No friction rub. No gallop.  Pulmonary:     Effort: Pulmonary effort is normal. No respiratory  distress.     Breath sounds: Normal breath sounds. No wheezing, rhonchi or rales.  Abdominal:     General: Bowel sounds are normal. There is no distension.     Palpations: Abdomen is soft.     Tenderness: There is no abdominal tenderness. There is no guarding or rebound.  Musculoskeletal:     Cervical back: Neck supple.     Right lower leg: No edema.     Left lower leg: No edema.  Skin:    General: Skin is warm and dry.     Coloration: Skin is not jaundiced or pale.  Neurological:     General: No focal deficit present.     Mental Status: She is alert and oriented to person, place, and time. Mental status is at baseline.  Psychiatric:        Mood and Affect: Mood normal.        Behavior: Behavior normal.        Thought Content: Thought content normal.        Judgment: Judgment normal.      Assessment:  [name] is a 68 y.o. female  who presents today for Colonoscopy for Personal history of colon polyps .  Plan:  Colonoscopy with possible intervention today  Colonoscopy with  possible biopsy, control of bleeding, polypectomy, and interventions as necessary has been discussed with the patient/patient representative. Informed consent was obtained from the patient/patient representative after explaining the indication, nature, and risks of the procedure including but not limited to death, bleeding, perforation, missed neoplasm/lesions, cardiorespiratory compromise, and reaction to medications. Opportunity for questions was given and appropriate answers were provided. Patient/patient representative has verbalized understanding is amenable to undergoing the procedure.   Elspeth Ozell Jungling, DO  [facility]  Portions of the record may have been created with voice recognition software. Occasional wrong-word or 'sound-a-like' substitutions may have occurred due to the inherent limitations of voice recognition software.  Read the chart carefully and recognize, using context, where substitutions may have occurred.

## 2024-04-04 LAB — SURGICAL PATHOLOGY

## 2024-04-27 ENCOUNTER — Encounter: Admitting: Nurse Practitioner

## 2024-05-18 NOTE — Progress Notes (Signed)
 HPI:  Kelly Nguyen is a 68 y.o. female who presents for follow-up now 6 months status post a reverse left total shoulder arthroplasty for a massive rotator cuff tear with cuff arthropathy.  Overall, the patient feels that she is doing well.  She denies any pain in the shoulder and is not taking any medications for discomfort.  She denies any trauma or injury since her last evaluation.  She denies any numbness or tingling to the left upper extremity.  She denies any catching or locking symptoms of the left shoulder.  She denies any signs of infection such as fevers chills or drainage from the left shoulder incision site.  She is no longer working with physical therapy.  She does believe that she may have aggravated her left shoulder at the gym last week but the pain has resolved.  She does still report some difficulty with internal rotation from her back.  Current Outpatient Medications  Medication Sig Dispense Refill   amoxicillin (AMOXIL) 500 MG capsule as directed     ascorbic acid/vitamin E/biotin (HAIR, SKIN, NAILS WITH BIOTIN ORAL) Take 3 tablets by mouth once daily     celecoxib  (CELEBREX ) 100 MG capsule Take 1 capsule (100 mg total) by mouth once daily 90 capsule 1   colestipoL  (COLESTID ) 1 gram tablet Take 2 tablets (2 g total) by mouth once daily Take before meals 60 tablet 3   colestipoL  (COLESTID ) 1 gram tablet Take 2 tablets (2 g total) by mouth 2 (two) times daily 120 tablet 11   fluticasone  propionate (FLONASE ) 50 mcg/actuation nasal spray Place 1 spray into one nostril once daily (Patient not taking: Reported on 03/15/2024)     Lactobac no.41/Bifidobact no.7 (PROBIOTIC-10 ORAL) Take 1 capsule by mouth once daily     lisinopriL -hydroCHLOROthiazide  (ZESTORETIC ) 10-12.5 mg tablet Take 1 tablet by mouth once daily 90 tablet 3   multivitamin with minerals tablet Take 1 tablet by mouth once daily.       No current facility-administered medications for this visit.   Allergies   Allergen Reactions   Meloxicam Rash   Tramadol  Anxiety, Nausea and Nausea And Vomiting   Past Medical History:  Diagnosis Date   Arthritis    Chicken pox    Enlarged heart    Hyperlipidemia    Hypertension    Prediabetes    Past Surgical History:  Procedure Laterality Date   RIGHT ROTATOR CUFF SURGERY Right 2005   CARDIAC CATHERIZATION  2005   RIGHT FOOT SURGERY Right 2005   Right total knee arthroplasty using computer-assisted navigation  05/27/2017   Dr Mardee   Left total knee arthroplasty using computer-assisted navigation Left 04/07/2022   Dr.Hooten   Colon @ [facility]  11/11/2022   Tubular adenomas/Repeat 7yr/[facility]   Reverse total shoulder  Left 11/19/2023   By Dr. Edie   REVERSE TOTAL SHOULDER Left 11/19/2023   Dr. Edie   COLON @ [facility]  04/01/2024   Tubular Adenoma/ Repeat 3 years/ [facility]   CHOLECYSTECTOMY     HYSTERECTOMY     UPPER AND LOWER ENDOSCOPY      Family History  Problem Relation Name Age of Onset   Schizophrenia Mother     Depression Mother     Personality disorder Mother     Myocardial Infarction (Heart attack) Father     Heart disease Father     Hyperlipidemia (Elevated cholesterol) Father     Kidney disease Father     Coronary Artery Disease (Blocked arteries around heart)  Father     ESRD-Dialysis Father     Mental illness Sister     Dysphagia Sister     Esophageal Stricture Sister     Thyroid  disease Maternal Grandmother     Lung cancer Maternal Grandfather     High blood pressure (Hypertension) Maternal Grandfather     COPD Maternal Grandfather     Breast cancer Paternal Grandmother     Myocardial Infarction (Heart attack) Paternal Grandfather      Social History   Socioeconomic History   Marital status: Married    Spouse name: Joe Keim    Number of children: 1   Years of education: 13+  Occupational History   Occupation: Retired  Tobacco Use   Smoking status: Never   Smokeless tobacco:  Never  Vaping Use   Vaping status: Never Used  Substance and Sexual Activity   Alcohol use: Yes    Alcohol/week: 2.0 - 6.0 standard drinks of alcohol    Types: 2 - 6 Standard drinks or equivalent per week   Drug use: No   Sexual activity: Yes    Partners: Male    Birth control/protection: Post-menopausal, Surgical    Comment: hysterectomy   Social Drivers of Health   Financial Resource Strain: Low Risk  (05/18/2024)   Overall Financial Resource Strain (CARDIA)    Difficulty of Paying Living Expenses: Not very hard  Food Insecurity: No Food Insecurity (05/18/2024)   Hunger Vital Sign    Worried About Running Out of Food in the Last Year: Never true    Ran Out of Food in the Last Year: Never true  Transportation Needs: No Transportation Needs (05/18/2024)   PRAPARE - Administrator, Civil Service (Medical): No    Lack of Transportation (Non-Medical): No    Review of Systems:  A comprehensive 14 point ROS was performed, reviewed, and the pertinent orthopaedic findings are documented in the HPI.  Exam: BP 112/80   Ht 163.8 cm (5' 4.5)   Wt 79.4 kg (175 lb)   BMI 29.57 kg/m  General/Constitutional: The patient appears to be well-nourished, well-developed, and in no acute distress. Neuro/Psych: Normal mood and affect, oriented to person, place and time. Eyes: Non-icteric.  Pupils are equal, round, and reactive to light, and exhibit synchronous movement. ENT: Unremarkable. Lymphatic: No palpable adenopathy. Respiratory: Non-labored breathing Cardiovascular: No edema, swelling or tenderness, except as noted in detailed exam. Integumentary: No impressive skin lesions present, except as noted in detailed exam. Musculoskeletal: Unremarkable, except as noted in detailed exam.  Left shoulder: Skin inspection of the left shoulder demonstrates her surgical incision to be well-healed and without evidence for infection.  No swelling, erythema, ecchymosis,  abrasions, or other skin abnormalities are identified.  She has no tenderness to palpation over the anterior or lateral aspects of the shoulder.  Actively, she is able to reach her ipsilateral ear, the top of her head, the back of her neck, and across to the opposite shoulder.  She also was able to internally rotate to the L3 vertebral body she notes mild tightness with internal rotation.  Actively she is able to forward flex close to 100 degrees, abduction to increase.  She exhibits 4/5 strength with gentle resisted internal and external rotation, as well as with gentle resisted abduction.  She is neurovascularly intact to the left upper extremity and hand.  X-rays/MRI/Lab data:  AP, lateral and Y scapular views of the left shoulder were obtained today in the office and reviewed by  me.  These x-rays demonstrate the patient is status post a left reverse total shoulder arthroplasty.  Hardware is in excellent position without any evidence of loosening.  There is no acute bony abnormality identified.  No acute changes compared to previous x-rays.  Assessment: Encounter Diagnoses  Name Primary?   S/P reverse total shoulder arthroplasty, left    Rotator cuff arthropathy, left Yes   Nontraumatic complete tear of left rotator cuff     Plan: 1.  Treatment options were discussed today with the patient. 2.  She may continue to perform activities as tolerated with the left upper extremity. 3.  Continue to work on gentle stretching and strength training at home focusing on internal rotation. 4.  The patient will follow up with Dr. Edie in 6 months for repeat evaluation.  If doing well we will discharge from orthopedics. 5.  The patient can contact the clinic if she has any questions, new symptoms develop or symptoms worsen.  DOROTHA Gustavo Level, PA-C, CAQ-OS [facility]

## 2024-06-15 ENCOUNTER — Encounter: Payer: Self-pay | Admitting: Ophthalmology

## 2024-06-20 NOTE — Discharge Instructions (Signed)

## 2024-06-22 ENCOUNTER — Encounter
Admission: RE | Disposition: A | Discharge status: Home / Self Care | Payer: Self-pay | Source: Home / Self Care | Attending: Ophthalmology

## 2024-06-22 ENCOUNTER — Other Ambulatory Visit: Payer: Self-pay

## 2024-06-22 ENCOUNTER — Ambulatory Visit: Payer: Self-pay | Admitting: Anesthesiology

## 2024-06-22 ENCOUNTER — Ambulatory Visit
Admission: RE | Admit: 2024-06-22 | Discharge: 2024-06-22 | Disposition: A | Discharge status: Home / Self Care | Attending: Ophthalmology | Admitting: Ophthalmology

## 2024-06-22 ENCOUNTER — Encounter: Payer: Self-pay | Admitting: Ophthalmology

## 2024-06-22 DIAGNOSIS — H2512 Age-related nuclear cataract, left eye: Secondary | ICD-10-CM | POA: Diagnosis present

## 2024-06-22 DIAGNOSIS — I10 Essential (primary) hypertension: Secondary | ICD-10-CM | POA: Insufficient documentation

## 2024-06-22 HISTORY — DX: Unspecified osteoarthritis, unspecified site: M19.90

## 2024-06-22 HISTORY — PX: CATARACT EXTRACTION W/PHACO: SHX586

## 2024-06-22 MED ORDER — TETRACAINE HCL 0.5 % OP SOLN
OPHTHALMIC | Status: AC
  Filled 2024-06-22: qty 4

## 2024-06-22 MED ORDER — PHENYLEPHRINE HCL 10 % OP SOLN
OPHTHALMIC | Status: AC
  Filled 2024-06-22: qty 5

## 2024-06-22 MED ORDER — CYCLOPENTOLATE HCL 2 % OP SOLN
1.0000 [drp] | OPHTHALMIC | Status: AC | PRN
  Administered 2024-06-22 (×3): 1 [drp] via OPHTHALMIC

## 2024-06-22 MED ORDER — LIDOCAINE HCL (PF) 2 % IJ SOLN
INTRAOCULAR | Status: DC | PRN
  Administered 2024-06-22: 2 mL

## 2024-06-22 MED ORDER — PHENYLEPHRINE HCL 10 % OP SOLN
1.0000 [drp] | OPHTHALMIC | Status: AC | PRN
  Administered 2024-06-22 (×3): 1 [drp] via OPHTHALMIC

## 2024-06-22 MED ORDER — TETRACAINE HCL 0.5 % OP SOLN
1.0000 [drp] | OPHTHALMIC | Status: DC | PRN
  Administered 2024-06-22 (×3): 1 [drp] via OPHTHALMIC

## 2024-06-22 MED ORDER — MIDAZOLAM HCL 2 MG/2ML IJ SOLN
INTRAMUSCULAR | Status: AC
  Filled 2024-06-22: qty 2

## 2024-06-22 MED ORDER — CEFUROXIME OPHTHALMIC INJECTION 1 MG/0.1 ML
INJECTION | OPHTHALMIC | Status: DC | PRN
  Administered 2024-06-22: 1 mg via INTRACAMERAL

## 2024-06-22 MED ORDER — SIGHTPATH DOSE#1 BSS IO SOLN: INTRAOCULAR | Status: DC | PRN

## 2024-06-22 MED ORDER — SIGHTPATH DOSE#1 BSS IO SOLN
INTRAOCULAR | Status: DC | PRN
  Administered 2024-06-22: 15 mL via INTRAOCULAR

## 2024-06-22 MED ORDER — FENTANYL CITRATE (PF) 100 MCG/2ML IJ SOLN
INTRAMUSCULAR | Status: AC
  Filled 2024-06-22: qty 2

## 2024-06-22 MED ORDER — FENTANYL CITRATE (PF) 100 MCG/2ML IJ SOLN
INTRAMUSCULAR | Status: DC | PRN
  Administered 2024-06-22: 50 ug via INTRAVENOUS

## 2024-06-22 MED ORDER — CYCLOPENTOLATE HCL 2 % OP SOLN
OPHTHALMIC | Status: AC
  Filled 2024-06-22: qty 2

## 2024-06-22 MED ORDER — LACTATED RINGERS IV SOLN: INTRAVENOUS | Status: DC

## 2024-06-22 MED ORDER — SIGHTPATH DOSE#1 NA HYALUR & NA CHOND-NA HYALUR IO KIT
PACK | INTRAOCULAR | Status: DC | PRN
  Administered 2024-06-22: 1 via OPHTHALMIC

## 2024-06-22 MED ORDER — BRIMONIDINE TARTRATE-TIMOLOL 0.2-0.5 % OP SOLN
OPHTHALMIC | Status: DC | PRN
  Administered 2024-06-22: 1 [drp] via OPHTHALMIC

## 2024-06-22 MED ORDER — MIDAZOLAM HCL (PF) 2 MG/2ML IJ SOLN
INTRAMUSCULAR | Status: DC | PRN
  Administered 2024-06-22: 1.5 mg via INTRAVENOUS
  Administered 2024-06-22: .5 mg via INTRAVENOUS

## 2024-06-22 NOTE — Anesthesia Preprocedure Evaluation (Signed)
"                                    Anesthesia Evaluation  Patient identified by MRN, date of birth, ID band Patient awake    Reviewed: Allergy & Precautions, H&P , NPO status , Patient's Chart, lab work & pertinent test results  Airway Mallampati: II  TM Distance: >3 FB Neck ROM: Full    Dental no notable dental hx. (+) Teeth Intact   Pulmonary neg pulmonary ROS   Pulmonary exam normal breath sounds clear to auscultation       Cardiovascular hypertension, negative cardio ROS Normal cardiovascular exam Rhythm:Regular Rate:Normal     Neuro/Psych negative neurological ROS  negative psych ROS   GI/Hepatic negative GI ROS, Neg liver ROS,,,  Endo/Other  negative endocrine ROS    Renal/GU negative Renal ROS  negative genitourinary   Musculoskeletal negative musculoskeletal ROS (+)    Abdominal   Peds negative pediatric ROS (+)  Hematology negative hematology ROS (+)   Anesthesia Other Findings   Reproductive/Obstetrics negative OB ROS                              Anesthesia Physical Anesthesia Plan  ASA: 2  Anesthesia Plan: MAC   Post-op Pain Management:    Induction: Intravenous  PONV Risk Score and Plan:   Airway Management Planned:   Additional Equipment:   Intra-op Plan:   Post-operative Plan: Extubation in OR  Informed Consent: I have reviewed the patients History and Physical, chart, labs and discussed the procedure including the risks, benefits and alternatives for the proposed anesthesia with the patient or authorized representative who has indicated his/her understanding and acceptance.     Dental advisory given  Plan Discussed with: CRNA  Anesthesia Plan Comments:         Anesthesia Quick Evaluation  "

## 2024-06-22 NOTE — Anesthesia Postprocedure Evaluation (Signed)
"   Anesthesia Post Note  Patient: Kelly Nguyen  Procedure(s) Performed: PHACOEMULSIFICATION, CATARACT, WITH IOL INSERTION 4.55 00:29.4 (Left: Eye)  Patient location during evaluation: PACU Anesthesia Type: MAC Level of consciousness: awake and alert Pain management: pain level controlled Vital Signs Assessment: post-procedure vital signs reviewed and stable Respiratory status: spontaneous breathing, nonlabored ventilation, respiratory function stable and patient connected to nasal cannula oxygen Cardiovascular status: blood pressure returned to baseline and stable Postop Assessment: no apparent nausea or vomiting Anesthetic complications: no   No notable events documented.   Last Vitals:  Vitals:   06/22/24 0946 06/22/24 0951  BP: 112/69 115/70  Pulse: 63 66  Resp: (!) 6 15  Temp: (!) 36.2 C (!) 36.2 C  SpO2: 97% 98%    Last Pain:  Vitals:   06/22/24 0951  TempSrc:   PainSc: 0-No pain                 Fairy A Gualberto Wahlen      "

## 2024-06-22 NOTE — Transfer of Care (Signed)
 Immediate Anesthesia Transfer of Care Note  Patient: Kelly Nguyen  Procedure(s) Performed: PHACOEMULSIFICATION, CATARACT, WITH IOL INSERTION 4.55 00:29.4 (Left: Eye)  Patient Location: PACU  Anesthesia Type: MAC  Level of Consciousness: awake, alert  and patient cooperative  Airway and Oxygen Therapy: Patient Spontanous Breathing and Patient connected to supplemental oxygen  Post-op Assessment: Post-op Vital signs reviewed, Patient's Cardiovascular Status Stable, Respiratory Function Stable, Patent Airway and No signs of Nausea or vomiting  Post-op Vital Signs: Reviewed and stable  Complications: No notable events documented.

## 2024-06-22 NOTE — Op Note (Signed)
 OPERATIVE NOTE  Kelly Nguyen [id number] 06/22/2024   PREOPERATIVE DIAGNOSIS:  Nuclear sclerotic cataract left eye. H25.12   POSTOPERATIVE DIAGNOSIS:    Nuclear sclerotic cataract left eye.     PROCEDURE:  Phacoemusification with posterior chamber intraocular lens placement of the left eye  Ultrasound time: Procedures: PHACOEMULSIFICATION, CATARACT, WITH IOL INSERTION 4.55 00:29.4 (Left)  LENS:   Implant Name Type Inv. Item Serial No. Manufacturer Lot No. LRB No. Used Action  LENS IOL TECNIS EYHANCE 8.0 - [id number] Intraocular Lens LENS IOL TECNIS EYHANCE 8.0 [id number] [employer]  Left 1 Implanted      SURGEON:  Dene FABIENE Etienne, MD   ANESTHESIA:  Topical with tetracaine  drops and 2% Xylocaine  jelly, augmented with 1% preservative-free intracameral lidocaine .    COMPLICATIONS:  None.   DESCRIPTION OF PROCEDURE:  The patient was identified in the holding room and transported to the operating room and placed in the supine position under the operating microscope.  The left eye was identified as the operative eye and it was prepped and draped in the usual sterile ophthalmic fashion.   A 1 millimeter clear-corneal paracentesis was made at the 1:30 position.  0.5 ml of preservative-free 1% lidocaine  was injected into the anterior chamber.  The anterior chamber was filled with Viscoat viscoelastic.  A 2.4 millimeter keratome was used to make a near-clear corneal incision at the 10:30 position.  .  A curvilinear capsulorrhexis was made with a cystotome and capsulorrhexis forceps.  Balanced salt solution was used to hydrodissect and hydrodelineate the nucleus.   Phacoemulsification was then used in stop and chop fashion to remove the lens nucleus and epinucleus.  The remaining cortex was then removed using the irrigation and aspiration handpiece. Provisc was then placed into the capsular bag to distend it for lens placement.  A lens was then injected into the capsular bag.  The remaining  viscoelastic was aspirated.   Wounds were hydrated with balanced salt solution.  The anterior chamber was inflated to a physiologic pressure with balanced salt solution.  No wound leaks were noted. Cefuroxime  0.1 ml of a 10mg /ml solution was injected into the anterior chamber for a dose of 1 mg of intracameral antibiotic at the completion of the case.   Timolol  and Brimonidine  drops were applied to the eye.  The patient was taken to the recovery room in stable condition without complications of anesthesia or surgery.  [name] 06/22/2024, 9:44 AM

## 2024-06-22 NOTE — H&P (Signed)
 " [facility]   Primary Care Physician:  Franchot Houston, PA-C Ophthalmologist: Dr. Dene Etienne  Pre-Procedure History & Physical: HPI:  Kelly Nguyen is a 69 y.o. female here for ophthalmic surgery.   Past Medical History:  Diagnosis Date   Arthritis    Diaphragmatic hernia    Diverticulosis    Dysrhythmia    Heart murmur    Heart valve problem    History of hiatal hernia    Hyperlipidemia    Hypertension    Nontraumatic complete tear of left rotator cuff    Prolapse of uterus 2012   Rotator cuff tendinitis, left    Stress incontinence 2012    Past Surgical History:  Procedure Laterality Date   ABDOMINAL HYSTERECTOMY     BICEPT TENODESIS Left 11/19/2023   Procedure: TENODESIS, BICEPS;  Surgeon: Edie Norleen PARAS, MD;  Location: [facility] ORS;  Service: Orthopedics;  Laterality: Left;   CARDIAC CATHETERIZATION     CHOLECYSTECTOMY     COLONOSCOPY N/A 04/01/2024   Procedure: COLONOSCOPY;  Surgeon: Onita Elspeth Sharper, DO;  Location: [facility] ENDOSCOPY;  Service: Gastroenterology;  Laterality: N/A;   COLONOSCOPY WITH PROPOFOL  N/A 02/27/2017   Procedure: COLONOSCOPY WITH PROPOFOL ;  Surgeon: Therisa Bi, MD;  Location: [facility] ENDOSCOPY;  Service: Gastroenterology;  Laterality: N/A;   COLONOSCOPY WITH PROPOFOL  N/A 11/10/2022   Procedure: COLONOSCOPY WITH PROPOFOL ;  Surgeon: Onita Elspeth Sharper, DO;  Location: [facility] ENDOSCOPY;  Service: Gastroenterology;  Laterality: N/A;   CYSTOCELE REPAIR N/A 04/10/2015   Procedure: ANTERIOR REPAIR (CYSTOCELE);  Surgeon: Lamar SHAUNNA Lesches, MD;  Location: [facility] ORS;  Service: Gynecology;  Laterality: N/A;   HALLUX VALGUS CORRECTION Right 2004   HAMMER TOE SURGERY Right    JOINT REPLACEMENT     KNEE ARTHROPLASTY Right 05/27/2017   Procedure: COMPUTER ASSISTED TOTAL KNEE ARTHROPLASTY;  Surgeon: Mardee Lynwood SHAUNNA, MD;  Location: [facility] ORS;  Service: Orthopedics;  Laterality: Right;   KNEE ARTHROPLASTY Left 04/07/2022   Procedure: COMPUTER ASSISTED  TOTAL KNEE ARTHROPLASTY;  Surgeon: Mardee Lynwood SHAUNNA, MD;  Location: [facility] ORS;  Service: Orthopedics;  Laterality: Left;   POLYPECTOMY  04/01/2024   Procedure: POLYPECTOMY, INTESTINE;  Surgeon: Onita Elspeth Sharper, DO;  Location: [facility] ENDOSCOPY;  Service: Gastroenterology;;   REVERSE SHOULDER ARTHROPLASTY Left 11/19/2023   Procedure: ARTHROPLASTY, SHOULDER, TOTAL, REVERSE;  Surgeon: Edie Norleen PARAS, MD;  Location: [facility] ORS;  Service: Orthopedics;  Laterality: Left;   ROTATOR CUFF REPAIR Right 2004   SALPINGOOPHORECTOMY Bilateral 04/10/2015   Procedure: SALPINGO OOPHORECTOMY;  Surgeon: Lamar SHAUNNA Lesches, MD;  Location: [facility] ORS;  Service: Gynecology;  Laterality: Bilateral;  vaginal   VAGINAL HYSTERECTOMY  04/10/2015   Procedure: HYSTERECTOMY VAGINAL, ;  Surgeon: Lamar SHAUNNA Lesches, MD;  Location: [facility] ORS;  Service: Gynecology;;    Prior to Admission medications  Medication Sig Start Date End Date Taking? Authorizing Provider  ASHWAGANDHA PO Take 1,000 mg by mouth daily.   Yes [provider]  Biotin 5000 MCG CAPS Take 1 capsule by mouth daily.   Yes [provider]  celecoxib  (CELEBREX ) 100 MG capsule TAKE 1 CAPSULE BY MOUTH DAILY AS NEEDED FOR MILD PAIN (PAIN SCORE 1-3) OR MODERATE PAIN (PAIN SCORE 4-6) 03/15/24  Yes Marylynn Verneita CROME, MD  colestipol  (COLESTID ) 1 g tablet Take 1 tablet (1 g total) by mouth daily. Patient taking differently: Take 1 g by mouth 2 (two) times daily. 11/18/22  Yes Hope Merle, MD  Krill Oil 500 MG CAPS Take 1 capsule by mouth daily.  Yes [provider]  lactobacillus acidophilus (BACID) TABS tablet Take 1 tablet by mouth daily. Probiotic   Yes [provider]  lisinopril -hydrochlorothiazide  (ZESTORETIC ) 10-12.5 MG tablet Take 1 tablet by mouth daily. 02/04/24  Yes Dineen Rollene MATSU, FNP  MAGNESIUM  GLYCINATE PO Take 240 mg by mouth daily.   Yes [provider]  Moringa Oleifera (MORINGA PO) Take by mouth daily.   Yes [provider]  Multiple Vitamins-Minerals (MULTIVITAMIN WITH MINERALS) tablet Take 1 tablet by mouth daily.   Yes [provider]  Vitamin D -Vitamin K (VITAMIN K2-VITAMIN D3 PO) Take 1 tablet by mouth daily.   Yes [provider]    Allergies as of 04/20/2024 - Review Complete 04/01/2024  Allergen Reaction Noted   Acyclovir and related  05/27/2017   Meloxicam Rash    Tramadol  Nausea And Vomiting, Anxiety, and Nausea Only 05/20/2022    Family History  Problem Relation Age of Onset   Healthy Mother    Mental illness Mother    Schizophrenia Mother    Heart disease Father    CAD Father    Depression Sister    Migraines Sister    Anxiety disorder Sister    Hypertension Sister    Schizophrenia Sister    Diabetes Other    Breast cancer Paternal Grandmother 60    Social History   Socioeconomic History   Marital status: Married    Spouse name: Fairy   Number of children: 1   Years of education: some college   Highest education level: Some college, no degree  Occupational History   Occupation: Retired    Comment: from [employer]  Tobacco Use   Smoking status: Never   Smokeless tobacco: Never  Vaping Use   Vaping status: Never Used  Substance and Sexual Activity   Alcohol use: Yes    Alcohol/week: 1.0 standard drink of alcohol    Types: 1 Glasses of wine per week    Comment: one glass a week   Drug use: Never   Sexual activity: Yes    Birth control/protection: Surgical  Other Topics Concern   Not on file  Social History Narrative   Not on file   Social Drivers of Health   Tobacco Use: Low Risk (06/15/2024)   Patient History    Smoking Tobacco Use: Never    Smokeless Tobacco Use: Never    Passive Exposure: Not on file  Financial Resource Strain: Low Risk  (05/18/2024)   Received from [facility]   Overall Financial Resource Strain (CARDIA)    Difficulty of Paying Living Expenses: Not very hard  Food Insecurity: No Food  Insecurity (05/18/2024)   Received from [facility]   Epic    Within the past 12 months, you worried that your food would run out before you got the money to buy more.: Never true    Within the past 12 months, the food you bought just didn't last and you didn't have money to get more.: Never true  Transportation Needs: No Transportation Needs (05/18/2024)   Received from [facility] - Transportation    In the past 12 months, has lack of transportation kept you from medical appointments or from getting medications?: No    Lack of Transportation (Non-Medical): No  Physical Activity: Sufficiently Active (09/01/2023)   Exercise Vital Sign    Days of Exercise per Week: 4 days    Minutes of Exercise per Session: 60 min  Stress: No Stress Concern Present (09/01/2023)   [employer] - Occupational Stress Questionnaire    Feeling of Stress : Not at all  Social Connections: Unknown (09/01/2023)   Social Connection and Isolation Panel    Frequency of Communication with Friends and Family: Three times a week    Frequency of Social Gatherings with Friends and Family: Once a week    Attends Religious Services: Patient declined    Database Administrator or Organizations: Yes    Attends Engineer, Structural: More than 4 times per year    Marital Status: Married  Catering Manager Violence: Not At Risk (04/07/2022)   Humiliation, Afraid, Rape, and Kick questionnaire    Fear of Current or Ex-Partner: No    Emotionally Abused: No    Physically Abused: No    Sexually Abused: No  Depression (PHQ2-9): Low Risk (09/04/2023)   Depression (PHQ2-9)    PHQ-2 Score: 2  Alcohol Screen: Low Risk (09/01/2023)   Alcohol Screen    Last Alcohol Screening Score (AUDIT): 3  Housing: Low Risk  (05/18/2024)   Received from [facility]   Epic    In the last 12 months, was there a time when you were not able to pay the mortgage  or rent on time?: No    In the past 12 months, how many times have you moved where you were living?: 0    At any time in the past 12 months, were you homeless or living in a shelter (including now)?: No  Utilities: Not At Risk (05/18/2024)   Received from [facility]   Epic    In the past 12 months has the electric, gas, oil, or water company threatened to shut off services in your home?: No  Health Literacy: Not on file    Review of Systems: See HPI, otherwise negative ROS  Physical Exam: BP 128/76   Pulse 76   Temp 98.5 F (36.9 C) (Temporal)   Resp 18   Ht 5' 6 (1.676 m)   Wt 77.1 kg   SpO2 100%   BMI 27.44 kg/m  General:   Alert,  pleasant and cooperative in NAD Head:  Normocephalic and atraumatic. Lungs:  Clear to auscultation.    Heart:  Regular rate and rhythm.   Impression/Plan: Kelly Nguyen is here for ophthalmic surgery.  Risks, benefits, limitations, and alternatives regarding ophthalmic surgery have been reviewed with the patient.  Questions have been answered.  All parties agreeable.   MITTIE GASKIN, MD  06/22/2024, 9:04 AM  "

## 2024-06-23 ENCOUNTER — Encounter: Payer: Self-pay | Admitting: Ophthalmology

## 2024-06-28 ENCOUNTER — Telehealth

## 2024-06-28 DIAGNOSIS — J40 Bronchitis, not specified as acute or chronic: Secondary | ICD-10-CM

## 2024-06-28 DIAGNOSIS — R058 Other specified cough: Secondary | ICD-10-CM | POA: Insufficient documentation

## 2024-06-28 DIAGNOSIS — J329 Chronic sinusitis, unspecified: Secondary | ICD-10-CM | POA: Insufficient documentation

## 2024-06-28 MED ORDER — AMOXICILLIN-POT CLAVULANATE 875-125 MG PO TABS: 1.0000 | ORAL_TABLET | Freq: Two times a day (BID) | ORAL | 0 refills | Status: AC

## 2024-06-28 MED ORDER — BENZONATATE 200 MG PO CAPS: 200.0000 mg | ORAL_CAPSULE | Freq: Three times a day (TID) | ORAL | 0 refills | Status: AC | PRN

## 2024-06-28 MED ORDER — FLUTICASONE PROPIONATE 50 MCG/ACT NA SUSP: 2.0000 | Freq: Every day | NASAL | 0 refills | Status: AC

## 2024-06-28 NOTE — Assessment & Plan Note (Signed)
 Onset approx. 4 weeks ago with failure to improve. No red flag symptoms. Reviewed s/s necessitating in person evaluation. Plan to treat with empiric augmentin  875 mg BID for 7 days. Supportive care encouraged with Mucinex DM, flonase  intranasal spray daily, and cough control with benzonatate  TID PRN cough. Side effect profiles reviewed. Questions reviewed and pt verbalized understanding.

## 2024-06-28 NOTE — Assessment & Plan Note (Signed)
 See above  See above.

## 2024-06-28 NOTE — Patient Instructions (Signed)
 " Nena Free, thank you for joining Comer LULLA Rouleau, NP for today's virtual visit.  While this provider is not your primary care provider (PCP), if your PCP is located in our provider database this encounter information will be shared with them immediately following your visit.   A [facility] MyChart account gives you access to today's visit and all your visits, tests, and labs performed at [facility]  click here if you don't have a [facility] MyChart account or go to [email or web address]  Consent: (Patient) Kelly Nguyen provided verbal consent for this virtual visit at the beginning of the encounter.  Current Medications:  Current Outpatient Medications:    amoxicillin -clavulanate (AUGMENTIN ) 875-125 MG tablet, Take 1 tablet by mouth 2 (two) times daily for 7 days., Disp: 14 tablet, Rfl: 0   benzonatate  (TESSALON ) 200 MG capsule, Take 1 capsule (200 mg total) by mouth 3 (three) times daily as needed for cough., Disp: 20 capsule, Rfl: 0   fluticasone  (FLONASE ) 50 MCG/ACT nasal spray, Place 2 sprays into both nostrils daily., Disp: 18.2 mL, Rfl: 0   ASHWAGANDHA PO, Take 1,000 mg by mouth daily., Disp: , Rfl:    Biotin 5000 MCG CAPS, Take 1 capsule by mouth daily., Disp: , Rfl:    celecoxib  (CELEBREX ) 100 MG capsule, TAKE 1 CAPSULE BY MOUTH DAILY AS NEEDED FOR MILD PAIN (PAIN SCORE 1-3) OR MODERATE PAIN (PAIN SCORE 4-6), Disp: 90 capsule, Rfl: 0   colestipol  (COLESTID ) 1 g tablet, Take 1 tablet (1 g total) by mouth daily. (Patient taking differently: Take 1 g by mouth 2 (two) times daily.), Disp: 30 tablet, Rfl: 3   Krill Oil 500 MG CAPS, Take 1 capsule by mouth daily., Disp: , Rfl:    lactobacillus acidophilus (BACID) TABS tablet, Take 1 tablet by mouth daily. Probiotic, Disp: , Rfl:    lisinopril -hydrochlorothiazide  (ZESTORETIC ) 10-12.5 MG tablet, Take 1 tablet by mouth daily., Disp: 90 tablet, Rfl: 1   MAGNESIUM  GLYCINATE PO, Take 240 mg by mouth daily., Disp: , Rfl:     Moringa Oleifera (MORINGA PO), Take by mouth daily., Disp: , Rfl:    Multiple Vitamins-Minerals (MULTIVITAMIN WITH MINERALS) tablet, Take 1 tablet by mouth daily., Disp: , Rfl:    Vitamin D -Vitamin K (VITAMIN K2-VITAMIN D3 PO), Take 1 tablet by mouth daily., Disp: , Rfl:    Medications ordered in this encounter:  Meds ordered this encounter  Medications   amoxicillin -clavulanate (AUGMENTIN ) 875-125 MG tablet    Sig: Take 1 tablet by mouth 2 (two) times daily for 7 days.    Dispense:  14 tablet    Refill:  0   benzonatate  (TESSALON ) 200 MG capsule    Sig: Take 1 capsule (200 mg total) by mouth 3 (three) times daily as needed for cough.    Dispense:  20 capsule    Refill:  0   fluticasone  (FLONASE ) 50 MCG/ACT nasal spray    Sig: Place 2 sprays into both nostrils daily.    Dispense:  18.2 mL    Refill:  0     *If you need refills on other medications prior to your next appointment, please contact your pharmacy*  Follow-Up: Call back or seek an in-person evaluation if the symptoms worsen or if the condition fails to improve as anticipated.  [facility] [phone]  Other Instructions Given your symptoms and failure to improve, you likely have a sinus infection as well as bronchitis. You have been prescribed an antibiotic (augmentin ), a cough  suppressant (benzonatate ), and flonase  nasal spray to help calm down the cough/manage the sinus symptoms. I also recommend taking Mucinex DM over the counter. Saline nasal irrigations/rinses 1-2 days are also beneficial to help treat the sinus symptoms; use bottled distilled water with these.  Please take your antibiotic in it's entirety, and do not stop it just because you are feeling better. Take it with food.  If you develop worsening symptoms including new onset fevers, you need to seek in person evaluation. If your symptoms fail to improve, you should also be evaluated in person with chest imaging.  Hope you feel better soon!     If you have been instructed to have an in-person evaluation today at a local Urgent Care facility, please use the link below. It will take you to a list of all of our available [facility], including address, phone number and hours of operation. Please do not delay care.  [facility]  If you or a family member do not have a primary care provider, use the link below to schedule a visit and establish care. When you choose a [facility] primary care physician or advanced practice provider, you gain a long-term partner in health. Find a Primary Care Provider  Learn more about [facility]'s in-office and virtual care options: [facility] - Get Care Now  "

## 2024-06-28 NOTE — Progress Notes (Signed)
 " Virtual Visit Consent   Kelly Nguyen, you are scheduled for a virtual visit with a [facility] provider today. Just as with appointments in the office, your consent must be obtained to participate. Your consent will be active for this visit and any virtual visit you may have with one of our providers in the next 365 days. If you have a MyChart account, a copy of this consent can be sent to you electronically.  As this is a virtual visit, video technology does not allow for your provider to perform a traditional examination. This may limit your provider's ability to fully assess your condition. If your provider identifies any concerns that need to be evaluated in person or the need to arrange testing (such as labs, EKG, etc.), we will make arrangements to do so. Although advances in technology are sophisticated, we cannot ensure that it will always work on either your end or our end. If the connection with a video visit is poor, the visit may have to be switched to a telephone visit. With either a video or telephone visit, we are not always able to ensure that we have a secure connection.  By engaging in this virtual visit, you consent to the provision of healthcare and authorize for your insurance to be billed (if applicable) for the services provided during this visit. Depending on your insurance coverage, you may receive a charge related to this service.  I need to obtain your verbal consent now. Are you willing to proceed with your visit today? [name] has provided verbal consent on 06/28/2024 for a virtual visit (video or telephone). Comer LULLA Rouleau, NP  Date: 06/28/2024 8:29 AM   Virtual Visit via Video Note   I, Comer LULLA Rouleau, connected with  Kelly Nguyen  ([id number], May 14, 1956) on 06/28/24 at  8:15 AM EST by a video-enabled telemedicine application and verified that I am speaking with the correct person using two identifiers.  Location: Patient: Virtual Visit Location Patient:  Home Provider: Virtual Visit Location Provider: Home Office   I discussed the limitations of evaluation and management by telemedicine and the availability of in person appointments. The patient expressed understanding and agreed to proceed.    History of Present Illness: Kelly Nguyen is a 69 y.o. who identifies as a female who was assigned female at birth, and is being seen today for subacute cough.  Last 4 weeks. Initially productive. Felt she improved but cough persisted; has been dry for the most part. She did wake up this morning and coughed up some green mucus again.  Congested sinuses with runny nose, which is clear to white in color currently. No fevers, hemoptysis, sore throat, ear pain. Face feels full. No chest pain, wheezing, shortness of breath. No hx of pulmonary disease.  Took COVID/flu test at onset which were negative  Eating and drinking well Was taking mucinex but didn't feel it was doing much so stopped it.  NKDA.  No other OTC medications. Has not been on any antibiotics recently.    HPI: HPI  Problems:  Patient Active Problem List   Diagnosis Date Noted   Sinobronchitis 06/28/2024   Post-viral cough syndrome 06/28/2024   Vaginal bleeding 08/06/2023   Anemia 03/24/2023   Post-COVID syndrome 03/24/2023   Prediabetes 03/24/2023   Breast cancer screening by mammogram 12/14/2022   Postmenopausal estrogen deficiency 12/14/2022   Dermatitis 12/14/2022   Abnormal glucose 12/14/2022   Vitamin D  deficiency 12/14/2022   Total knee replacement status, left 04/07/2022  Primary osteoarthritis of left knee 02/09/2022   Chronic diarrhea 01/22/2022   Mixed hyperlipidemia 07/25/2021   Pseudopolyposis of colon without complication, unspecified part of colon (HCC) 09/26/2019   Status post total right knee replacement 05/27/2017   Lumbar radiculopathy 03/28/2016   S/P hysterectomy 04/10/2015   Uterine prolapse 04/09/2015   Abnormal EKG 04/09/2015   Arthralgia of multiple  joints 10/12/2007   Colon, diverticulosis 08/28/2003   Cardiac murmur 08/28/2003   Diaphragmatic hernia 06/22/2003   Essential (primary) hypertension 08/17/2002    Allergies: Allergies[1] Medications: Current Medications[2]  Observations/Objective: Patient is well-developed, well-nourished in no acute distress.  Resting comfortably  at home.  Head is normocephalic, atraumatic.  No labored breathing. Able to speak in complete sentences without difficulties  Speech is clear and coherent with logical content. Bronchitic cough Patient is alert and oriented at baseline.   Assessment and Plan: 1. Post-viral cough syndrome (Primary) - amoxicillin -clavulanate (AUGMENTIN ) 875-125 MG tablet; Take 1 tablet by mouth 2 (two) times daily for 7 days.  Dispense: 14 tablet; Refill: 0 - benzonatate  (TESSALON ) 200 MG capsule; Take 1 capsule (200 mg total) by mouth 3 (three) times daily as needed for cough.  Dispense: 20 capsule; Refill: 0 - fluticasone  (FLONASE ) 50 MCG/ACT nasal spray; Place 2 sprays into both nostrils daily.  Dispense: 18.2 mL; Refill: 0  2. Sinobronchitis - amoxicillin -clavulanate (AUGMENTIN ) 875-125 MG tablet; Take 1 tablet by mouth 2 (two) times daily for 7 days.  Dispense: 14 tablet; Refill: 0 - benzonatate  (TESSALON ) 200 MG capsule; Take 1 capsule (200 mg total) by mouth 3 (three) times daily as needed for cough.  Dispense: 20 capsule; Refill: 0 - fluticasone  (FLONASE ) 50 MCG/ACT nasal spray; Place 2 sprays into both nostrils daily.  Dispense: 18.2 mL; Refill: 0  Sinobronchitis Onset approx. 4 weeks ago with failure to improve. No red flag symptoms. Reviewed s/s necessitating in person evaluation. Plan to treat with empiric augmentin  875 mg BID for 7 days. Supportive care encouraged with Mucinex DM, flonase  intranasal spray daily, and cough control with benzonatate  TID PRN cough. Side effect profiles reviewed. Questions reviewed and pt verbalized understanding.   Post-viral cough  syndrome See above   Follow Up Instructions: I discussed the assessment and treatment plan with the patient. The patient was provided an opportunity to ask questions and all were answered. The patient agreed with the plan and demonstrated an understanding of the instructions.  A copy of instructions were sent to the patient via MyChart unless otherwise noted below.    The patient was advised to call back or seek an in-person evaluation if the symptoms worsen or if the condition fails to improve as anticipated.    Comer LULLA Rouleau, NP     [1]  Allergies Allergen Reactions   Acyclovir And Related     Patient not familiar with this medication   Meloxicam Rash   Tramadol  Nausea And Vomiting, Anxiety and Nausea Only  [2]  Current Outpatient Medications:    amoxicillin -clavulanate (AUGMENTIN ) 875-125 MG tablet, Take 1 tablet by mouth 2 (two) times daily for 7 days., Disp: 14 tablet, Rfl: 0   benzonatate  (TESSALON ) 200 MG capsule, Take 1 capsule (200 mg total) by mouth 3 (three) times daily as needed for cough., Disp: 20 capsule, Rfl: 0   fluticasone  (FLONASE ) 50 MCG/ACT nasal spray, Place 2 sprays into both nostrils daily., Disp: 18.2 mL, Rfl: 0   ASHWAGANDHA PO, Take 1,000 mg by mouth daily., Disp: , Rfl:    Biotin 5000 MCG  CAPS, Take 1 capsule by mouth daily., Disp: , Rfl:    celecoxib  (CELEBREX ) 100 MG capsule, TAKE 1 CAPSULE BY MOUTH DAILY AS NEEDED FOR MILD PAIN (PAIN SCORE 1-3) OR MODERATE PAIN (PAIN SCORE 4-6), Disp: 90 capsule, Rfl: 0   colestipol  (COLESTID ) 1 g tablet, Take 1 tablet (1 g total) by mouth daily. (Patient taking differently: Take 1 g by mouth 2 (two) times daily.), Disp: 30 tablet, Rfl: 3   Krill Oil 500 MG CAPS, Take 1 capsule by mouth daily., Disp: , Rfl:    lactobacillus acidophilus (BACID) TABS tablet, Take 1 tablet by mouth daily. Probiotic, Disp: , Rfl:    lisinopril -hydrochlorothiazide  (ZESTORETIC ) 10-12.5 MG tablet, Take 1 tablet by mouth daily., Disp: 90  tablet, Rfl: 1   MAGNESIUM  GLYCINATE PO, Take 240 mg by mouth daily., Disp: , Rfl:    Moringa Oleifera (MORINGA PO), Take by mouth daily., Disp: , Rfl:    Multiple Vitamins-Minerals (MULTIVITAMIN WITH MINERALS) tablet, Take 1 tablet by mouth daily., Disp: , Rfl:    Vitamin D -Vitamin K (VITAMIN K2-VITAMIN D3 PO), Take 1 tablet by mouth daily., Disp: , Rfl:   "

## 2024-07-05 NOTE — Discharge Instructions (Signed)

## 2024-07-06 ENCOUNTER — Ambulatory Visit: Payer: Self-pay | Admitting: Anesthesiology

## 2024-07-06 ENCOUNTER — Encounter: Payer: Self-pay | Admitting: Ophthalmology

## 2024-07-06 ENCOUNTER — Other Ambulatory Visit: Payer: Self-pay

## 2024-07-06 ENCOUNTER — Encounter
Admission: RE | Disposition: A | Discharge status: Home / Self Care | Payer: Self-pay | Source: Home / Self Care | Attending: Ophthalmology

## 2024-07-06 ENCOUNTER — Ambulatory Visit
Admission: RE | Admit: 2024-07-06 | Discharge: 2024-07-06 | Disposition: A | Discharge status: Home / Self Care | Payer: Self-pay | Source: Home / Self Care | Attending: Ophthalmology | Admitting: Ophthalmology

## 2024-07-06 MED ORDER — TETRACAINE HCL 0.5 % OP SOLN
OPHTHALMIC | Status: AC
  Filled 2024-07-06: qty 4

## 2024-07-06 MED ORDER — BRIMONIDINE TARTRATE-TIMOLOL 0.2-0.5 % OP SOLN
OPHTHALMIC | Status: DC | PRN
  Administered 2024-07-06: 1 [drp] via OPHTHALMIC

## 2024-07-06 MED ORDER — FENTANYL CITRATE (PF) 100 MCG/2ML IJ SOLN
INTRAMUSCULAR | Status: AC
  Filled 2024-07-06: qty 2

## 2024-07-06 MED ORDER — PHENYLEPHRINE HCL 10 % OP SOLN
1.0000 [drp] | OPHTHALMIC | Status: DC | PRN
  Administered 2024-07-06 (×2): 1 [drp] via OPHTHALMIC

## 2024-07-06 MED ORDER — CEFUROXIME OPHTHALMIC INJECTION 1 MG/0.1 ML
INJECTION | OPHTHALMIC | Status: DC | PRN
  Administered 2024-07-06: 1 mg via INTRACAMERAL

## 2024-07-06 MED ORDER — CYCLOPENTOLATE HCL 2 % OP SOLN
OPHTHALMIC | Status: AC
  Filled 2024-07-06: qty 2

## 2024-07-06 MED ORDER — SIGHTPATH DOSE#1 NA HYALUR & NA CHOND-NA HYALUR IO KIT
PACK | INTRAOCULAR | Status: DC | PRN
  Administered 2024-07-06: 1 via OPHTHALMIC

## 2024-07-06 MED ORDER — MIDAZOLAM HCL 2 MG/2ML IJ SOLN
INTRAMUSCULAR | Status: AC
  Filled 2024-07-06: qty 2

## 2024-07-06 MED ORDER — TETRACAINE HCL 0.5 % OP SOLN
1.0000 [drp] | OPHTHALMIC | Status: DC | PRN
  Administered 2024-07-06 (×3): 1 [drp] via OPHTHALMIC

## 2024-07-06 MED ORDER — SIGHTPATH DOSE#1 BSS IO SOLN
INTRAOCULAR | Status: DC | PRN
  Administered 2024-07-06: 15 mL via INTRAOCULAR

## 2024-07-06 MED ORDER — SIGHTPATH DOSE#1 BSS IO SOLN
INTRAOCULAR | Status: DC | PRN
  Administered 2024-07-06: 73 mL via OPHTHALMIC

## 2024-07-06 MED ORDER — LACTATED RINGERS IV SOLN: INTRAVENOUS | Status: DC

## 2024-07-06 MED ORDER — PHENYLEPHRINE HCL 10 % OP SOLN
OPHTHALMIC | Status: AC
  Filled 2024-07-06: qty 5

## 2024-07-06 MED ORDER — FENTANYL CITRATE (PF) 100 MCG/2ML IJ SOLN
INTRAMUSCULAR | Status: DC | PRN
  Administered 2024-07-06 (×2): 50 ug via INTRAVENOUS

## 2024-07-06 MED ORDER — MIDAZOLAM HCL (PF) 2 MG/2ML IJ SOLN
INTRAMUSCULAR | Status: DC | PRN
  Administered 2024-07-06 (×3): 1 mg via INTRAVENOUS

## 2024-07-06 MED ORDER — CYCLOPENTOLATE HCL 2 % OP SOLN
1.0000 [drp] | OPHTHALMIC | Status: DC | PRN
  Administered 2024-07-06 (×2): 1 [drp] via OPHTHALMIC

## 2024-07-06 MED ORDER — LIDOCAINE HCL (PF) 2 % IJ SOLN
INTRAOCULAR | Status: DC | PRN
  Administered 2024-07-06: 2 mL

## 2024-07-06 NOTE — Op Note (Signed)
 LOCATION:  [facility]   PREOPERATIVE DIAGNOSIS:    Nuclear sclerotic cataract right eye. H25.11   POSTOPERATIVE DIAGNOSIS:  Nuclear sclerotic cataract right eye.     PROCEDURE:  Phacoemusification with posterior chamber intraocular lens placement of the right eye   ULTRASOUND TIME: Procedures: PHACOEMULSIFICATION, CATARACT, WITH IOL INSERTION 9.65 00:41.6 (Right)  LENS:   Implant Name Type Inv. Item Serial No. Manufacturer Lot No. LRB No. Used Action  LENS IOL TECNIS EYHANCE 8.5 - [id number] Intraocular Lens LENS IOL TECNIS EYHANCE 8.5 [id number] [employer]  Right 1 Implanted         SURGEON:  Dene FABIENE Etienne, MD   ANESTHESIA:  Topical with tetracaine  drops and 2% Xylocaine  jelly, augmented with 1% preservative-free intracameral lidocaine .    COMPLICATIONS:  None.   DESCRIPTION OF PROCEDURE:  The patient was identified in the holding room and transported to the operating room and placed in the supine position under the operating microscope.  The right eye was identified as the operative eye and it was prepped and draped in the usual sterile ophthalmic fashion.   A 1 millimeter clear-corneal paracentesis was made at the 12:00 position.  0.5 ml of preservative-free 1% lidocaine  was injected into the anterior chamber. The anterior chamber was filled with Viscoat viscoelastic.  A 2.4 millimeter keratome was used to make a near-clear corneal incision at the 9:00 position.  A curvilinear capsulorrhexis was made with a cystotome and capsulorrhexis forceps.  Balanced salt solution was used to hydrodissect and hydrodelineate the nucleus.   Phacoemulsification was then used in stop and chop fashion to remove the lens nucleus and epinucleus.  The remaining cortex was then removed using the irrigation and aspiration handpiece. Provisc was then placed into the capsular bag to distend it for lens placement.  A lens was then injected into the capsular bag.  The remaining viscoelastic  was aspirated.   Wounds were hydrated with balanced salt solution.  The anterior chamber was inflated to a physiologic pressure with balanced salt solution.  No wound leaks were noted. Cefuroxime  0.1 ml of a 10mg /ml solution was injected into the anterior chamber for a dose of 1 mg of intracameral antibiotic at the completion of the case.   Timolol  and Brimonidine  drops were applied to the eye.  The patient was taken to the recovery room in stable condition without complications of anesthesia or surgery.   Kelly Nguyen 07/06/2024, 8:20 AM

## 2024-07-06 NOTE — Anesthesia Preprocedure Evaluation (Signed)
"                                    Anesthesia Evaluation  Patient identified by MRN, date of birth, ID band Patient awake    Reviewed: Allergy & Precautions, H&P , NPO status , Patient's Chart, lab work & pertinent test results  Airway Mallampati: II  TM Distance: >3 FB Neck ROM: Full    Dental no notable dental hx.    Pulmonary neg pulmonary ROS   Pulmonary exam normal breath sounds clear to auscultation       Cardiovascular hypertension, negative cardio ROS Normal cardiovascular exam Rhythm:Regular Rate:Normal     Neuro/Psych negative neurological ROS  negative psych ROS   GI/Hepatic negative GI ROS, Neg liver ROS,,,  Endo/Other  negative endocrine ROS    Renal/GU negative Renal ROS  negative genitourinary   Musculoskeletal negative musculoskeletal ROS (+)    Abdominal   Peds negative pediatric ROS (+)  Hematology negative hematology ROS (+)   Anesthesia Other Findings   Reproductive/Obstetrics negative OB ROS                              Anesthesia Physical Anesthesia Plan  ASA: 3  Anesthesia Plan: MAC   Post-op Pain Management:    Induction: Intravenous  PONV Risk Score and Plan:   Airway Management Planned:   Additional Equipment:   Intra-op Plan:   Post-operative Plan: Extubation in OR  Informed Consent: I have reviewed the patients History and Physical, chart, labs and discussed the procedure including the risks, benefits and alternatives for the proposed anesthesia with the patient or authorized representative who has indicated his/her understanding and acceptance.     Dental advisory given  Plan Discussed with: CRNA  Anesthesia Plan Comments:         Anesthesia Quick Evaluation  "

## 2024-07-06 NOTE — H&P (Signed)
 " [facility]   Primary Care Physician:  Franchot Houston, PA-C Ophthalmologist: Dr. Dene Etienne  Pre-Procedure History & Physical: HPI:  Kelly Nguyen is a 69 y.o. female here for ophthalmic surgery.   Past Medical History:  Diagnosis Date   Arthritis    Diaphragmatic hernia    Diverticulosis    Dysrhythmia    Heart murmur    Heart valve problem    History of hiatal hernia    Hyperlipidemia    Hypertension    Nontraumatic complete tear of left rotator cuff    Prolapse of uterus 2012   Rotator cuff tendinitis, left    Stress incontinence 2012    Past Surgical History:  Procedure Laterality Date   ABDOMINAL HYSTERECTOMY     BICEPT TENODESIS Left 11/19/2023   Procedure: TENODESIS, BICEPS;  Surgeon: Edie Norleen PARAS, MD;  Location: [facility];  Service: Orthopedics;  Laterality: Left;   CARDIAC CATHETERIZATION     CATARACT EXTRACTION W/PHACO Left 06/22/2024   Procedure: PHACOEMULSIFICATION, CATARACT, WITH IOL INSERTION 4.55 00:29.4;  Surgeon: Etienne Dene, MD;  Location: [facility];  Service: Ophthalmology;  Laterality: Left;   CHOLECYSTECTOMY     COLONOSCOPY N/A 04/01/2024   Procedure: COLONOSCOPY;  Surgeon: Onita Elspeth Sharper, DO;  Location: [facility] END[facility];  Service: Gastroenterology;  Laterality: N/A;   COLONOSCOPY WITH PROPOFOL  N/A 02/27/2017   Procedure: COLONOSCOPY WITH PROPOFOL ;  Surgeon: Therisa Bi, MD;  Location: [facility] ENDOSCOPY;  Service: Gastroenterology;  Laterality: N/A;   COLONOSCOPY WITH PROPOFOL  N/A 11/10/2022   Procedure: COLONOSCOPY WITH PROPOFOL ;  Surgeon: Onita Elspeth Sharper, DO;  Location: [facility] ENDOSCOPY;  Service: Gastroenterology;  Laterality: N/A;   CYSTOCELE REPAIR N/A 04/10/2015   Procedure: ANTERIOR REPAIR (CYSTOCELE);  Surgeon: Lamar SHAUNNA Lesches, MD;  Location: [facility];  Service: Gynecology;  Laterality: N/A;   HALLUX VALGUS CORRECTION Right 2004   HAMMER TOE SURGERY Right    JOINT REPLACEMENT     KNEE ARTHROPLASTY Right  05/27/2017   Procedure: COMPUTER ASSISTED TOTAL KNEE ARTHROPLASTY;  Surgeon: Mardee Lynwood SHAUNNA, MD;  Location: [facility];  Service: Orthopedics;  Laterality: Right;   KNEE ARTHROPLASTY Left 04/07/2022   Procedure: COMPUTER ASSISTED TOTAL KNEE ARTHROPLASTY;  Surgeon: Mardee Lynwood SHAUNNA, MD;  Location: [facility];  Service: Orthopedics;  Laterality: Left;   POLYPECTOMY  04/01/2024   Procedure: POLYPECTOMY, INTESTINE;  Surgeon: Onita Elspeth Sharper, DO;  Location: [facility] ENDOSCOPY;  Service: Gastroenterology;;   REVERSE SHOULDER ARTHROPLASTY Left 11/19/2023   Procedure: ARTHROPLASTY, SHOULDER, TOTAL, REVERSE;  Surgeon: Edie Norleen PARAS, MD;  Location: [facility];  Service: Orthopedics;  Laterality: Left;   ROTATOR CUFF REPAIR Right 2004   SALPINGOOPHORECTOMY Bilateral 04/10/2015   Procedure: SALPINGO OOPHORECTOMY;  Surgeon: Lamar SHAUNNA Lesches, MD;  Location: [facility];  Service: Gynecology;  Laterality: Bilateral;  vaginal   VAGINAL HYSTERECTOMY  04/10/2015   Procedure: HYSTERECTOMY VAGINAL, ;  Surgeon: Lamar SHAUNNA Lesches, MD;  Location: [facility];  Service: Gynecology;;    Prior to Admission medications  Medication Sig Start Date End Date Taking? Authorizing Provider  ASHWAGANDHA PO Take 1,000 mg by mouth daily.   Yes [provider]  benzonatate  (TESSALON ) 200 MG capsule Take 1 capsule (200 mg total) by mouth 3 (three) times daily as needed for cough. 06/28/24  Yes Cobb, Comer GAILS, NP  Biotin 5000 MCG CAPS Take 1 capsule by mouth daily.   Yes [provider]  celecoxib  (CELEBREX ) 100 MG capsule TAKE 1 CAPSULE BY MOUTH DAILY AS NEEDED FOR MILD PAIN (  PAIN SCORE 1-3) OR MODERATE PAIN (PAIN SCORE 4-6) 03/15/24  Yes Marylynn Verneita CROME, MD  fluticasone  (FLONASE ) 50 MCG/ACT nasal spray Place 2 sprays into both nostrils daily. 06/28/24  Yes Cobb, Comer GAILS, NP  Krill Oil 500 MG CAPS Take 1 capsule by mouth daily.   Yes [provider]  lactobacillus acidophilus (BACID) TABS tablet Take 1 tablet by  mouth daily. Probiotic   Yes [provider]  lisinopril -hydrochlorothiazide  (ZESTORETIC ) 10-12.5 MG tablet Take 1 tablet by mouth daily. 02/04/24  Yes Dineen Rollene MATSU, FNP  MAGNESIUM  GLYCINATE PO Take 240 mg by mouth daily.   Yes [provider]  Moringa Oleifera (MORINGA PO) Take by mouth daily.   Yes [provider]  Multiple Vitamins-Minerals (MULTIVITAMIN WITH MINERALS) tablet Take 1 tablet by mouth daily.   Yes [provider]  Vitamin D -Vitamin K (VITAMIN K2-VITAMIN D3 PO) Take 1 tablet by mouth daily.   Yes [provider]  colestipol  (COLESTID ) 1 g tablet Take 1 tablet (1 g total) by mouth daily. Patient taking differently: Take 1 g by mouth 2 (two) times daily. 11/18/22   Hope Merle, MD    Allergies as of 04/20/2024 - Review Complete 04/01/2024  Allergen Reaction Noted   Acyclovir and related  05/27/2017   Meloxicam Rash    Tramadol  Nausea And Vomiting, Anxiety, and Nausea Only 05/20/2022    Family History  Problem Relation Age of Onset   Healthy Mother    Mental illness Mother    Schizophrenia Mother    Heart disease Father    CAD Father    Depression Sister    Migraines Sister    Anxiety disorder Sister    Hypertension Sister    Schizophrenia Sister    Diabetes Other    Breast cancer Paternal Grandmother 43    Social History   Socioeconomic History   Marital status: Married    Spouse name: Fairy   Number of children: 1   Years of education: some college   Highest education level: Some college, no degree  Occupational History   Occupation: Retired    Comment: from [employer]  Tobacco Use   Smoking status: Never   Smokeless tobacco: Never  Vaping Use   Vaping status: Never Used  Substance and Sexual Activity   Alcohol use: Yes    Alcohol/week: 1.0 standard drink of alcohol    Types: 1 Glasses of wine per week    Comment: one glass a week   Drug use: Never   Sexual activity: Yes    Birth  control/protection: Surgical  Other Topics Concern   Not on file  Social History Narrative   Not on file   Social Drivers of Health   Tobacco Use: Low Risk (07/06/2024)   Patient History    Smoking Tobacco Use: Never    Smokeless Tobacco Use: Never    Passive Exposure: Not on file  Financial Resource Strain: Low Risk  (05/18/2024)   Received from [facility]   Overall Financial Resource Strain (CARDIA)    Difficulty of Paying Living Expenses: Not very hard  Food Insecurity: No Food Insecurity (05/18/2024)   Received from [facility]   Epic    Within the past 12 months, you worried that your food would run out before you got the money to buy more.: Never true    Within the past 12 months, the food you bought just didn't last and you didn't have money to get  more.: Never true  Transportation Needs: No Transportation Needs (05/18/2024)   Received from [facility] - Transportation    In the past 12 months, has lack of transportation kept you from medical appointments or from getting medications?: No    Lack of Transportation (Non-Medical): No  Physical Activity: Sufficiently Active (09/01/2023)   Exercise Vital Sign    Days of Exercise per Week: 4 days    Minutes of Exercise per Session: 60 min  Stress: No Stress Concern Present (09/01/2023)   [employer] - Occupational Stress Questionnaire    Feeling of Stress : Not at all  Social Connections: Unknown (09/01/2023)   Social Connection and Isolation Panel    Frequency of Communication with Friends and Family: Three times a week    Frequency of Social Gatherings with Friends and Family: Once a week    Attends Religious Services: Patient declined    Database Administrator or Organizations: Yes    Attends Engineer, Structural: More than 4 times per year    Marital Status: Married  Catering Manager Violence: Not At Risk (04/07/2022)    Humiliation, Afraid, Rape, and Kick questionnaire    Fear of Current or Ex-Partner: No    Emotionally Abused: No    Physically Abused: No    Sexually Abused: No  Depression (PHQ2-9): Low Risk (09/04/2023)   Depression (PHQ2-9)    PHQ-2 Score: 2  Alcohol Screen: Low Risk (09/01/2023)   Alcohol Screen    Last Alcohol Screening Score (AUDIT): 3  Housing: Low Risk  (05/18/2024)   Received from [facility]   Epic    In the last 12 months, was there a time when you were not able to pay the mortgage or rent on time?: No    In the past 12 months, how many times have you moved where you were living?: 0    At any time in the past 12 months, were you homeless or living in a shelter (including now)?: No  Utilities: Not At Risk (05/18/2024)   Received from [facility]   Epic    In the past 12 months has the electric, gas, oil, or water company threatened to shut off services in your home?: No  Health Literacy: Not on file    Review of Systems: See HPI, otherwise negative ROS  Physical Exam: BP (!) 140/75   Pulse 68   Temp 98.3 F (36.8 C) (Temporal)   Resp 17   Ht 5' 6 (1.676 m)   Wt 78.9 kg   SpO2 98%   BMI 28.07 kg/m  General:   Alert,  pleasant and cooperative in NAD Head:  Normocephalic and atraumatic. Lungs:  Clear to auscultation.    Heart:  Regular rate and rhythm.   Impression/Plan: Kelly Nguyen is here for ophthalmic surgery.  Risks, benefits, limitations, and alternatives regarding ophthalmic surgery have been reviewed with the patient.  Questions have been answered.  All parties agreeable.   MITTIE GASKIN, MD  07/06/2024, 7:38 AM  "

## 2024-07-06 NOTE — Anesthesia Postprocedure Evaluation (Signed)
"   Anesthesia Post Note  Patient: Irean Kendricks  Procedure(s) Performed: PHACOEMULSIFICATION, CATARACT, WITH IOL INSERTION 9.65 00:41.6 (Right)  Patient location during evaluation: PACU Anesthesia Type: MAC Level of consciousness: awake and alert Pain management: pain level controlled Vital Signs Assessment: post-procedure vital signs reviewed and stable Respiratory status: spontaneous breathing, nonlabored ventilation, respiratory function stable and patient connected to nasal cannula oxygen Cardiovascular status: blood pressure returned to baseline and stable Postop Assessment: no apparent nausea or vomiting Anesthetic complications: no   No notable events documented.   Last Vitals:  Vitals:   07/06/24 0702 07/06/24 0823  BP: (!) 140/75 102/61  Pulse: 68 65  Resp: 17 (!) 8  Temp: 36.8 C 36.8 C  SpO2: 98% 96%    Last Pain:  Vitals:   07/06/24 0823  TempSrc:   PainSc: 0-No pain                 Fairy A Yoselyn Mcglade      "

## 2024-07-06 NOTE — Transfer of Care (Signed)
 Immediate Anesthesia Transfer of Care Note  Patient: Kelly Nguyen  Procedure(s) Performed: PHACOEMULSIFICATION, CATARACT, WITH IOL INSERTION 9.65 00:41.6 (Right)  Patient Location: PACU  Anesthesia Type: MAC  Level of Consciousness: awake, alert  and patient cooperative  Airway and Oxygen Therapy: Patient Spontanous Breathing and Patient connected to supplemental oxygen  Post-op Assessment: Post-op Vital signs reviewed, Patient's Cardiovascular Status Stable, Respiratory Function Stable, Patent Airway and No signs of Nausea or vomiting  Post-op Vital Signs: Reviewed and stable  Complications: No notable events documented.

## 2024-09-06 ENCOUNTER — Ambulatory Visit
